# Patient Record
Sex: Female | Born: 1950 | Race: Black or African American | Hispanic: No | Smoking: Former smoker
Health system: Southern US, Community
[De-identification: ages and names within clinical notes are randomized; demographics above are authoritative.]

## PROBLEM LIST (undated history)

## (undated) DIAGNOSIS — I208 Other forms of angina pectoris: Secondary | ICD-10-CM

## (undated) DIAGNOSIS — I10 Essential (primary) hypertension: Secondary | ICD-10-CM

## (undated) DIAGNOSIS — F419 Anxiety disorder, unspecified: Secondary | ICD-10-CM

## (undated) DIAGNOSIS — E119 Type 2 diabetes mellitus without complications: Secondary | ICD-10-CM

## (undated) DIAGNOSIS — M62838 Other muscle spasm: Secondary | ICD-10-CM

## (undated) DIAGNOSIS — F1911 Other psychoactive substance abuse, in remission: Secondary | ICD-10-CM

## (undated) DIAGNOSIS — J189 Pneumonia, unspecified organism: Secondary | ICD-10-CM

## (undated) DIAGNOSIS — G894 Chronic pain syndrome: Secondary | ICD-10-CM

## (undated) DIAGNOSIS — A419 Sepsis, unspecified organism: Secondary | ICD-10-CM

## (undated) DIAGNOSIS — I2510 Atherosclerotic heart disease of native coronary artery without angina pectoris: Secondary | ICD-10-CM

## (undated) DIAGNOSIS — R42 Dizziness and giddiness: Secondary | ICD-10-CM

## (undated) DIAGNOSIS — R531 Weakness: Secondary | ICD-10-CM

## (undated) DIAGNOSIS — K219 Gastro-esophageal reflux disease without esophagitis: Secondary | ICD-10-CM

## (undated) DIAGNOSIS — G459 Transient cerebral ischemic attack, unspecified: Secondary | ICD-10-CM

## (undated) DIAGNOSIS — IMO0001 Reserved for inherently not codable concepts without codable children: Secondary | ICD-10-CM

## (undated) DIAGNOSIS — F329 Major depressive disorder, single episode, unspecified: Secondary | ICD-10-CM

## (undated) DIAGNOSIS — G629 Polyneuropathy, unspecified: Secondary | ICD-10-CM

## (undated) DIAGNOSIS — I499 Cardiac arrhythmia, unspecified: Secondary | ICD-10-CM

## (undated) DIAGNOSIS — E785 Hyperlipidemia, unspecified: Secondary | ICD-10-CM

## (undated) DIAGNOSIS — I6529 Occlusion and stenosis of unspecified carotid artery: Secondary | ICD-10-CM

## (undated) DIAGNOSIS — J449 Chronic obstructive pulmonary disease, unspecified: Secondary | ICD-10-CM

## (undated) DIAGNOSIS — R918 Other nonspecific abnormal finding of lung field: Secondary | ICD-10-CM

## (undated) DIAGNOSIS — I340 Nonrheumatic mitral (valve) insufficiency: Secondary | ICD-10-CM

## (undated) HISTORY — PX: OTHER SURGICAL HISTORY: SHX169

---

## 2006-01-24 ENCOUNTER — Emergency Department: Payer: Self-pay | Admitting: General Practice

## 2006-03-30 ENCOUNTER — Other Ambulatory Visit: Payer: Self-pay

## 2006-03-30 ENCOUNTER — Emergency Department: Payer: Self-pay | Admitting: Emergency Medicine

## 2006-05-20 ENCOUNTER — Emergency Department: Payer: Self-pay | Admitting: Emergency Medicine

## 2006-05-20 ENCOUNTER — Other Ambulatory Visit: Payer: Self-pay

## 2006-07-08 ENCOUNTER — Emergency Department: Payer: Self-pay | Admitting: Emergency Medicine

## 2006-07-08 ENCOUNTER — Other Ambulatory Visit: Payer: Self-pay

## 2006-10-01 ENCOUNTER — Other Ambulatory Visit: Payer: Self-pay

## 2006-10-01 ENCOUNTER — Emergency Department: Payer: Self-pay | Admitting: Emergency Medicine

## 2006-10-05 ENCOUNTER — Ambulatory Visit: Payer: Self-pay | Admitting: Physical Medicine & Rehabilitation

## 2006-10-05 ENCOUNTER — Inpatient Hospital Stay (HOSPITAL_COMMUNITY)
Admission: RE | Admit: 2006-10-05 | Discharge: 2006-10-20 | Payer: Self-pay | Admitting: Physical Medicine & Rehabilitation

## 2006-10-16 ENCOUNTER — Ambulatory Visit: Payer: Self-pay | Admitting: Vascular Surgery

## 2006-10-16 ENCOUNTER — Encounter (INDEPENDENT_AMBULATORY_CARE_PROVIDER_SITE_OTHER): Payer: Self-pay | Admitting: Physical Medicine & Rehabilitation

## 2006-11-23 ENCOUNTER — Encounter: Payer: Self-pay | Admitting: Physical Medicine & Rehabilitation

## 2006-11-28 ENCOUNTER — Encounter
Admission: RE | Admit: 2006-11-28 | Discharge: 2006-11-29 | Payer: Self-pay | Admitting: Physical Medicine & Rehabilitation

## 2006-11-29 ENCOUNTER — Ambulatory Visit: Payer: Self-pay | Admitting: Physical Medicine & Rehabilitation

## 2006-12-02 ENCOUNTER — Encounter: Payer: Self-pay | Admitting: Physical Medicine & Rehabilitation

## 2007-01-01 ENCOUNTER — Encounter: Payer: Self-pay | Admitting: Physical Medicine & Rehabilitation

## 2007-01-26 ENCOUNTER — Emergency Department: Payer: Self-pay | Admitting: Emergency Medicine

## 2007-01-26 ENCOUNTER — Other Ambulatory Visit: Payer: Self-pay

## 2007-02-01 ENCOUNTER — Encounter: Payer: Self-pay | Admitting: Physical Medicine & Rehabilitation

## 2007-02-20 ENCOUNTER — Encounter
Admission: RE | Admit: 2007-02-20 | Discharge: 2007-02-20 | Payer: Self-pay | Admitting: Physical Medicine & Rehabilitation

## 2007-03-04 ENCOUNTER — Encounter: Payer: Self-pay | Admitting: Physical Medicine & Rehabilitation

## 2007-03-23 ENCOUNTER — Ambulatory Visit: Payer: Self-pay | Admitting: Internal Medicine

## 2007-04-01 ENCOUNTER — Encounter: Payer: Self-pay | Admitting: Physical Medicine & Rehabilitation

## 2007-04-15 ENCOUNTER — Emergency Department: Payer: Self-pay | Admitting: Emergency Medicine

## 2007-04-16 ENCOUNTER — Other Ambulatory Visit: Payer: Self-pay

## 2007-05-02 ENCOUNTER — Encounter: Payer: Self-pay | Admitting: Physical Medicine & Rehabilitation

## 2007-05-15 ENCOUNTER — Ambulatory Visit: Payer: Self-pay | Admitting: Internal Medicine

## 2007-06-01 ENCOUNTER — Encounter: Payer: Self-pay | Admitting: Physical Medicine & Rehabilitation

## 2007-07-02 ENCOUNTER — Encounter: Payer: Self-pay | Admitting: Physical Medicine & Rehabilitation

## 2007-11-07 ENCOUNTER — Other Ambulatory Visit: Payer: Self-pay

## 2007-11-07 ENCOUNTER — Emergency Department: Payer: Self-pay | Admitting: Emergency Medicine

## 2007-12-21 ENCOUNTER — Emergency Department: Payer: Self-pay | Admitting: Emergency Medicine

## 2008-02-25 ENCOUNTER — Emergency Department: Payer: Self-pay | Admitting: Emergency Medicine

## 2008-03-05 ENCOUNTER — Emergency Department: Payer: Self-pay | Admitting: Emergency Medicine

## 2008-03-20 ENCOUNTER — Ambulatory Visit: Payer: Self-pay | Admitting: Vascular Surgery

## 2008-05-28 ENCOUNTER — Ambulatory Visit: Payer: Self-pay | Admitting: Gastroenterology

## 2008-06-11 ENCOUNTER — Emergency Department: Payer: Self-pay | Admitting: Emergency Medicine

## 2008-06-13 ENCOUNTER — Inpatient Hospital Stay: Payer: Self-pay | Admitting: Internal Medicine

## 2008-06-21 ENCOUNTER — Encounter: Payer: Self-pay | Admitting: Cardiology

## 2008-06-21 ENCOUNTER — Ambulatory Visit: Payer: Self-pay | Admitting: Family Medicine

## 2008-07-14 ENCOUNTER — Ambulatory Visit: Payer: Self-pay | Admitting: Family Medicine

## 2008-07-25 ENCOUNTER — Emergency Department: Payer: Self-pay | Admitting: Emergency Medicine

## 2008-08-25 ENCOUNTER — Ambulatory Visit: Payer: Self-pay | Admitting: Internal Medicine

## 2008-12-29 ENCOUNTER — Emergency Department: Payer: Self-pay | Admitting: Emergency Medicine

## 2009-01-12 ENCOUNTER — Ambulatory Visit: Payer: Self-pay | Admitting: Vascular Surgery

## 2009-03-05 ENCOUNTER — Ambulatory Visit: Payer: Self-pay | Admitting: Vascular Surgery

## 2009-06-29 ENCOUNTER — Emergency Department: Payer: Self-pay | Admitting: Emergency Medicine

## 2009-07-02 ENCOUNTER — Ambulatory Visit: Payer: Self-pay | Admitting: Vascular Surgery

## 2009-08-11 ENCOUNTER — Ambulatory Visit: Payer: Self-pay | Admitting: Vascular Surgery

## 2009-08-13 ENCOUNTER — Ambulatory Visit: Payer: Self-pay | Admitting: Vascular Surgery

## 2009-08-28 ENCOUNTER — Inpatient Hospital Stay: Payer: Self-pay | Admitting: Vascular Surgery

## 2009-10-02 ENCOUNTER — Ambulatory Visit: Payer: Self-pay | Admitting: Vascular Surgery

## 2010-02-09 ENCOUNTER — Ambulatory Visit: Payer: Self-pay | Admitting: Vascular Surgery

## 2010-06-15 NOTE — Discharge Summary (Signed)
Emily Kidd, Emily Kidd                   ACCOUNT NO.:  [id number]   MEDICAL RECORD NO.:  [id number]          PATIENT TYPE:  IPS   LOCATION:  4038                         FACILITY:  [facility]   PHYSICIAN:  Ellwood Dense, M.D.   DATE OF BIRTH:  12-26-1950   DATE OF ADMISSION:  10/05/2006  DATE OF DISCHARGE:  10/19/2006                               DISCHARGE SUMMARY   DISCHARGE DIAGNOSES:  1. Right frontoparietal cerebrovascular accident.  2. Right internal carotid artery stenosis to be evaluated by Dr. Holley Raring      at [facility].  3. History of cerebrovascular accident in 1996 and April of 2008 with      right-sided weakness.  4. Subcutaneous Lovenox for deep vein thrombosis prophylaxis.  5. Dysphagia.  6. Hyperlipidemia.  7. Hypertension.   This is a 60 year old black female with history of cerebrovascular  accident in 1996 as well as April of 2008 on Aggrenox who was admitted  to [facility] from [facility] on October 01, 2006 with left-sided weakness and slurred speech.  Cranial CT scan  showed a large right frontal to frontoparietal infarction.  Also with  findings of right internal carotid artery stenosis identified on prior  workup for cerebrovascular accident in April of 2008 which was to be  evaluated by Dr. Holley Raring, [phone], with arteriogram in the future.  Placed on subcutaneous Lovenox for deep vein thrombosis prophylaxis.  Remained on Aggrenox for cerebrovascular accident.   PAST MEDICAL HISTORY:  See discharge diagnoses.   HABITS:  Smokes one-half pack per day.  No alcohol.   ALLERGIES:  PENICILLIN.   SOCIAL HISTORY:  [place] parents in [place], [place]  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  One-  level home, five steps to entry.  Noted history of crack cocaine used up  until about 3 years ago.   MEDICATIONS PRIOR TO ADMISSION:  Aggrenox twice daily and Zocor 20 mg   daily.   REHABILITATION HOSPITAL COURSE:  The patient was admitted to inpatient  rehab services with therapies initiated on a 3-hour daily basis  consisting of physical therapy, occupational therapy and speech therapy  with rehabilitation nursing.  The following issues were addressed during  the patient's rehabilitation stay.  Pertaining to Ms. Bastone's right  frontoparietal cerebrovascular accident, she remained on Aggrenox  therapy.  She was supervision set up for bathing, minimal assist upper  body dressing, simple set up with minimal assist lower body dressing,  moderate assist ambulation and minimal assist for basic transfers.  She  did need some encouragement to participate fully with her therapy.  She  was continent of bowel and bladder.  Noted history of cerebrovascular  accident in 1996 as well as April of 2008.  During recent stroke of  April of 2008, noted findings of right internal carotid artery stenosis  with plan for evaluation with arteriogram of the neck for evaluation of  stroke per Dr. Holley Raring to be done [phone]) with this to be arranged  as an outpatient.  She  remained on subcutaneous Lovenox throughout her  rehab course for deep vein thrombosis prophylaxis.  She will continue  her Zocor for hyperlipidemia.  She did have a history of tobacco abuse.  She was weaned from a Nicoderm patch.  It was stressed the need for  cessation of smoking.  It was questionable that she would be compliant  with these requests.  Also, all issues were discussed in regards to no  driving and no illicit drug use.  Her diet had been advanced to  mechanical soft.  She was participating better towards the end of her  therapy program.  Full family teaching was to be completed with her  mother and family.  She was discharged to home.   LABORATORY DATA:  Latest labs showed a hemoglobin 11.8, hematocrit 34.7,  platelets 385,000.  Sodium 138, potassium 3.6, BUN 4, creatinine 0.6   DISCHARGE  MEDICATIONS:  1. Aggrenox 1 capsule twice daily.  2. Zocor 40 mg daily.  3. Baclofen 10 mg every 8 hours.  4. Nicoderm patch 14 mg daily x2 weeks and then 7 mg daily x2 weeks      and then stop.  5. Multivitamin daily.   DIET:  Mechanical soft.   SPECIAL INSTRUCTIONS:  Followup with Dr. Holley Raring, [phone] to schedule  arteriogram of the neck to evaluate cause of stroke.  The patient was to  make this appointment.  She would also see Dr. Thomasena Edis at the outpatient  rehab service office on November 29, 2006.      Emily Kidd, P.A.    ______________________________  Ellwood Dense, M.D.    DA/MEDQ  D:  10/19/2006  T:  10/19/2006  Job:  [id number]   cc:   Ellwood Dense, M.D.  Holley Raring, M.D.  [place], M.D.

## 2010-06-15 NOTE — Assessment & Plan Note (Signed)
The patient returns to the clinic today accompanied by her brother and  her mother.  The patient is a 60 year old African-American female with a  history of prior stroke in 1996 along with a stroke in April of 2008.  She was on Aggrenox for stroke prophylaxis prior to this admission.   The patient was admitted to [facility] from [facility] October 01, 2006, with acute onset of left-sided  weakness and slurred speech.  Cranial CT showed a large right frontal to  frontoparietal infarction.  She also had findings of right internal  carotid artery stenosis identified on prior workup for stroke in April  of 2008.  She was evaluated by Dr. Holley Raring and they planned an arteriogram  in the future.  She was placed on subcu Lovenox for DVT prophylaxis and  remained on Aggrenox therapy.   The patient was moved to the rehabilitation unit at [facility]  on October 05, 2006, and remained there through discharge, October 19, 2006.   Since discharge, the patient has been living at her home with care from  her family, specifically her mother.  She has been attending outpatient  physical and occupational therapy at [facility] two  times per week.  She reports that she is not getting adequate relief  from her Baclofen used 10 mg t.i.d.  She also reports that this makes  her itch.  We have decided to stop that medication and try Zanaflex  instead.   The patient also has complaints about restlessness and problems sleeping  despite taking Benadryl two tablets q.h.s.  We have decided to replace  that Benadryl with Trazodone.  The patient has had an arteriogram last  week at [facility].  She is due for stenting tomorrow and  they are going to contact [facility] to find out exactly where they  need to take her tomorrow.   In terms of bowel and bladder, she is continent and voiding normally.  She does need some help with  bathing and dressing and is unable to  ambulate without assistance at the present time.   MEDICATIONS:  1. Baclofen 10 mg 1/2 tablet t.i.d.  2. Simvastatin 40 mg daily.  3. Plavix 75 mg daily.   REVIEW OF SYSTEMS:  Positive for severe night sweats.   PHYSICAL EXAMINATION:  GENERAL:  Reasonably well-appearing, middle-aged,  adult female seated in a manual wheelchair.  VITAL SIGNS:  Blood pressure 128/66, pulse 88, respiratory rate 18, and  O2 saturation 99% on room air.  EXTREMITIES:  Right upper and right lower extremity strength was 5/5.  Bulk and tone were normal.  Reflexes were 2+ and symmetric.  Left upper  extremity strength showed 3-/5 strength with complaints of pain with  movement of her left shoulder.  Left lower extremity examination showed  3 to 3+/5 strength throughout.   IMPRESSION:  1. Status post right frontoparietal stroke.  2. History of prior stroke in 1996 and April of 2008 with right-sided      weakness.  3. Right internal carotid artery stenosis evaluated with recent      arteriogram and planned stenting tomorrow.  4. Hypertension.  5. Dyslipidemia.   In the office today, we did give the patient a new prescription for  Zanaflex to be used 4 mg 1/2 tablet to 1 tablet b.i.d. in place of  Baclofen.  We also asked her to stop her Benadryl and instead use  Trazodone 50  mg one to two tablets p.o. q.h.s. for insomnia.  We will  plan on seeing the patient in follow-up in approximately three months'  time.  She will remain on her Simvastatin and Plavix at this point.  No refills  were necessary on those two medicines.  They will be contacting [facility] to see when she needs to go in for stenting  tomorrow.           ______________________________  Ellwood Dense, M.D.     DC/MedQ  D:  11/29/2006 09:59:43  T:  11/29/2006 12:51:36  Job #:  [id number]

## 2010-06-15 NOTE — H&P (Signed)
[name], Kidd NO.:  [id number]   MEDICAL RECORD NO.:  [id number]          PATIENT TYPE:  IPS   LOCATION:  4038                         FACILITY:  [facility]   PHYSICIAN:  Ellwood Dense, M.D.   DATE OF BIRTH:  09-Oct-1950   DATE OF ADMISSION:  10/05/2006  DATE OF DISCHARGE:                              HISTORY & PHYSICAL   PRIMARY CARE PHYSICIANS:  Dr. Osker Mason and Dr. Holley Raring, [facility] neurologist.   HISTORY OF PRESENT ILLNESS:  Emily Kidd is a 60 year old African American  female with a history of a stroke in 1996 and then in April of 2008  while on Aggrenox. She reports that she had no residual deficits after  either stroke.   The patient was admitted initially to [facility]  from [facility] September 30, 2006 with left-sided  weakness and slurred speech.  Initial cranial CT showed a large right  frontal to frontoparietal infarct.  She also had findings of a right  internal carotid artery stenosis identified on a prior workup in April  2008.  There was a questionable plan for intervention in the future.  The patient remains on Aggrenox for stroke prophylaxis.  Subcutaneous  Lovenox was added for DVT prophylaxis.  CT angiography was negative.  Follow up cranial CT on October 03, 2006 showed evolution of right  middle cerebral artery infarct with questionable petechial hemorrhage  but no significant bleed.  Dr. Holley Raring had recommended follow up for  potential future studies.  The patient has been seen by speech therapy  and a bedside swallow evaluation showed that she could tolerate thin  liquid diet.  Recommend was to start Altace if blood pressure was  elevated.   The patient was evaluated by the rehabilitation physicians and felt to  be an appropriate candidate for inpatient rehabilitation.   REVIEW OF SYSTEMS:  Positive for numbness and reflux.   PAST MEDICAL HISTORY:  1. History of CVA x2 in  1996 and 2008 with right upper extremity      paresthesias but no significant paresis (the patient had received      TPA after the April 2008 stroke).  2. Dyslipidemia.   FAMILY HISTORY:  Positive for coronary artery disease and stroke.   SOCIAL HISTORY:  The patient lives with her elderly parents in  [place], [place].  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  She has not worked since that time.  She and her parents live in a one-level home with 4-5 steps to enter.  The patient smokes 1/2 pack of cigarettes per day and denies alcohol  usage. She did use crack cocaine until approximately 3 years ago.   FUNCTIONAL HISTORY PRIOR TO ADMISSION:  Independent.   ALLERGIES:  PENICILLIN.   MEDICATIONS PRIOR TO ADMISSION:  1. Aggrenox b.i.d.  2. Zocor 20 mg daily.   LABORATORY DATA:  Recent hemoglobin was 12.5 with a white count of 8.4.  Recent sodium is 140, potassium 3.7, chloride 107, bicarbonate 24,  BUN  16, creatinine 0.8 and glucose of 106.   PHYSICAL EXAMINATION:  GENERAL:  Reasonably well-appearing middle-aged  adult female lying in bed in no acute discomfort.  VITAL SIGNS:  Blood pressure 127/74.  Pulse 73.  Respiratory rate 18 and  oxygen saturation 96% on room air with temperature of 98.0.  HEENT:  Normocephalic, atraumatic.  CARDIOVASCULAR:  Regular rate and rhythm.  S1, S2.  Without murmurs.  ABDOMEN:  Soft, nontender with Positive bowel sounds.  LUNGS:  Clear to auscultation bilaterally.  NEUROLOGIC:  Alert and oriented x3 with occasional cue.  The cranial  nerves II-XII showed slight facial weakness on the left side.  Bilateral  upper extremity exam showed 4-/5 strength throughout.  Bulk and tone  were normal and reflexes were 2+ and symmetrical.  Bilateral lower  extremity exam showed 3+ to 4-/5 strength in hip flexion, knee extension  and ankle dorsiflexion.   IMPRESSION:  1. Status post right frontoparietal infarct  with mild left-sided      weakness.  2. History of prior stroke in 1996 and April 2008 with residual right      upper extremity paresthesias.  3. Presently the patient has deficits in activities of daily living,      transfers, ambulation, swallowing and higher-level cognition      related to the above-noted right frontoparietal stroke.   PLAN:  1. Admit to the rehabilitation unit for daily therapies to include      physical therapy for range of motion, strengthening, bed mobility,      transfers, pre-gait training, gait training and equipment      evaluation.  2. Occupational therapy for range of motion, strengthening, ADLs,      cognitive/perceptual training, splinting and equipment evaluation.  3. Rehabilitation nursing for skin care, wound care and bowel and      bladder training as necessary.  4. Speech therapy for oral motor exercises, vital stimulation and      evaluation of swallow with higher level cognition evaluation as      necessary.  5. Case management to assess home environment, assist with discharge      planning and arrange for appropriate follow-up care.  6. Social worker to assess family and social support, consultation      regarding disability issues and assist in discharge planning.  7. Check admission labs including CBC and comprehensive metabolic      panel in morning October 06, 2006.  8. Continue D3 chopped meats with thin liquid diet.  9. Lovenox 40 mg subcu daily for DVT prophylaxis.  10.Continue Aggrenox 1 capsule p.o. b.i.d.  11.Zocor 40 mg p.o. daily.  12.Tylenol 325 mg 1-2 tablets p.o. q.6 hours p.r.n. for pain.  13.Add Ultram 50 mg 1 tablet t.i.d. p.r.n. for pain relief.   PROGNOSIS:  Fair/good.   ESTIMATED LENGTH OF STAY:  10-15 days.   GOALS:  Modified independence in ADLs, transfers and ambulation.           ______________________________  Ellwood Dense, M.D.     DC/MEDQ  D:  10/06/2006  T:  10/06/2006  Job:  [id number]   cc:   Janine Ores, M.D.  Dr. Osker Mason

## 2010-06-18 ENCOUNTER — Ambulatory Visit: Payer: Self-pay | Admitting: Vascular Surgery

## 2010-08-13 ENCOUNTER — Ambulatory Visit: Payer: Self-pay | Admitting: Vascular Surgery

## 2010-08-20 ENCOUNTER — Inpatient Hospital Stay: Payer: Self-pay | Admitting: Vascular Surgery

## 2010-08-24 LAB — PATHOLOGY REPORT

## 2010-11-12 LAB — DIFFERENTIAL
Basophils Absolute: 0
Basophils Relative: 1
Eosinophils Absolute: 0.2
Eosinophils Relative: 3
Monocytes Absolute: 0.5
Neutro Abs: 3.3

## 2010-11-12 LAB — URINE MICROSCOPIC-ADD ON

## 2010-11-12 LAB — CBC
HCT: 34.7 — ABNORMAL LOW
Hemoglobin: 11.8 — ABNORMAL LOW
MCHC: 34
MCV: 95.3
Platelets: 385
RBC: 3.64 — ABNORMAL LOW
RDW: 13.1
WBC: 5.5

## 2010-11-12 LAB — URINALYSIS, ROUTINE W REFLEX MICROSCOPIC
Bilirubin Urine: NEGATIVE
Bilirubin Urine: NEGATIVE
Glucose, UA: NEGATIVE
Hgb urine dipstick: NEGATIVE
Ketones, ur: NEGATIVE
Ketones, ur: NEGATIVE
Ketones, ur: NEGATIVE
Nitrite: NEGATIVE
Nitrite: NEGATIVE
Nitrite: NEGATIVE
Protein, ur: NEGATIVE
Protein, ur: NEGATIVE
Specific Gravity, Urine: 1.022
Urobilinogen, UA: 1
Urobilinogen, UA: 1
pH: 5.5

## 2010-11-12 LAB — URINE CULTURE
Colony Count: >=100000
Culture: NO GROWTH

## 2010-11-12 LAB — COMPREHENSIVE METABOLIC PANEL
ALT: 25
Albumin: 3.5
Alkaline Phosphatase: 58
BUN: 4 — ABNORMAL LOW
Chloride: 110
Glucose, Bld: 97
Potassium: 3.6
Sodium: 138
Total Bilirubin: 0.5

## 2010-11-19 ENCOUNTER — Ambulatory Visit: Payer: Self-pay

## 2011-05-02 ENCOUNTER — Emergency Department: Payer: Self-pay | Admitting: Emergency Medicine

## 2011-05-02 LAB — COMPREHENSIVE METABOLIC PANEL
Alkaline Phosphatase: 103 U/L (ref 50–136)
Anion Gap: 8 (ref 7–16)
BUN: 10 mg/dL (ref 7–18)
Bilirubin,Total: 0.3 mg/dL (ref 0.2–1.0)
Chloride: 107 mmol/L (ref 98–107)
Co2: 25 mmol/L (ref 21–32)
Creatinine: 0.7 mg/dL (ref 0.60–1.30)
Glucose: 97 mg/dL (ref 65–99)
Potassium: 3.2 mmol/L — ABNORMAL LOW (ref 3.5–5.1)
SGOT(AST): 20 U/L (ref 15–37)
Sodium: 140 mmol/L (ref 136–145)

## 2011-05-02 LAB — TSH: Thyroid Stimulating Horm: 1.23 u[IU]/mL

## 2011-05-02 LAB — CBC
HCT: 36.9 % (ref 35.0–47.0)
MCV: 90 fL (ref 80–100)
RBC: 4.1 10*6/uL (ref 3.80–5.20)
RDW: 14.8 % — ABNORMAL HIGH (ref 11.5–14.5)

## 2011-05-02 LAB — DRUG SCREEN, URINE
Amphetamines, Ur Screen: NEGATIVE (ref ?–1000)
Barbiturates, Ur Screen: NEGATIVE (ref ?–200)
MDMA (Ecstasy)Ur Screen: POSITIVE (ref ?–500)
Phencyclidine (PCP) Ur S: NEGATIVE (ref ?–25)
Tricyclic, Ur Screen: NEGATIVE (ref ?–1000)

## 2011-05-02 LAB — ETHANOL: Ethanol: <3 mg/dL

## 2011-05-02 LAB — ACETAMINOPHEN LEVEL: Acetaminophen: 6 ug/mL — ABNORMAL LOW

## 2011-07-02 LAB — COMPREHENSIVE METABOLIC PANEL
Albumin: 3.4 g/dL (ref 3.4–5.0)
Alkaline Phosphatase: 103 U/L (ref 50–136)
Anion Gap: 9 (ref 7–16)
BUN: 10 mg/dL (ref 7–18)
Calcium, Total: 8.6 mg/dL (ref 8.5–10.1)
Co2: 25 mmol/L (ref 21–32)
EGFR (African American): >60
EGFR (Non-African Amer.): >60
Glucose: 98 mg/dL (ref 65–99)
SGOT(AST): 16 U/L (ref 15–37)
Sodium: 143 mmol/L (ref 136–145)

## 2011-07-02 LAB — CBC WITH DIFFERENTIAL/PLATELET
HGB: 11.5 g/dL — ABNORMAL LOW (ref 12.0–16.0)
MCHC: 32.2 g/dL (ref 32.0–36.0)
Monocyte %: 12.7 %
RDW: 15.7 % — ABNORMAL HIGH (ref 11.5–14.5)

## 2011-07-02 LAB — APTT: Activated PTT: 39.2 secs — ABNORMAL HIGH (ref 23.6–35.9)

## 2011-07-02 LAB — CK-MB: CK-MB: <0.5 ng/mL — ABNORMAL LOW (ref 0.5–3.6)

## 2011-07-03 LAB — COMPREHENSIVE METABOLIC PANEL
Anion Gap: 10 (ref 7–16)
BUN: 8 mg/dL (ref 7–18)
Bilirubin,Total: 0.4 mg/dL (ref 0.2–1.0)
Calcium, Total: 9 mg/dL (ref 8.5–10.1)
Co2: 24 mmol/L (ref 21–32)
EGFR (African American): >60
EGFR (Non-African Amer.): >60
Glucose: 97 mg/dL (ref 65–99)
Osmolality: 281 (ref 275–301)
Potassium: 3.5 mmol/L (ref 3.5–5.1)
SGPT (ALT): 20 U/L
Sodium: 142 mmol/L (ref 136–145)
Total Protein: 7.5 g/dL (ref 6.4–8.2)

## 2011-07-03 LAB — CBC WITH DIFFERENTIAL/PLATELET
Basophil #: 0 10*3/uL (ref 0.0–0.1)
Basophil %: 0.3 %
Eosinophil %: 2.6 %
HCT: 37.2 % (ref 35.0–47.0)
HGB: 12.3 g/dL (ref 12.0–16.0)
Lymphocyte #: 1 10*3/uL (ref 1.0–3.6)
MCH: 29.3 pg (ref 26.0–34.0)
Monocyte %: 9.3 %
Neutrophil #: 3.5 10*3/uL (ref 1.4–6.5)
Platelet: 291 10*3/uL (ref 150–440)
RBC: 4.21 10*6/uL (ref 3.80–5.20)

## 2011-07-03 LAB — LIPID PANEL
Cholesterol: 135 mg/dL (ref 0–200)
HDL Cholesterol: 53 mg/dL (ref 40–60)
Triglycerides: 140 mg/dL (ref 0–200)

## 2011-07-03 LAB — TROPONIN I: Troponin-I: <0.02 ng/mL

## 2011-07-04 ENCOUNTER — Inpatient Hospital Stay: Payer: Self-pay | Admitting: Internal Medicine

## 2011-11-28 ENCOUNTER — Emergency Department: Payer: Self-pay | Admitting: Emergency Medicine

## 2011-11-28 LAB — TROPONIN I
Troponin-I: <0.02 ng/mL
Troponin-I: <0.02 ng/mL

## 2011-11-28 LAB — CK TOTAL AND CKMB (NOT AT ARMC): CK-MB: <0.5 ng/mL — ABNORMAL LOW (ref 0.5–3.6)

## 2011-11-28 LAB — CBC
MCV: 89 fL (ref 80–100)
Platelet: 337 10*3/uL (ref 150–440)
RBC: 4.43 10*6/uL (ref 3.80–5.20)

## 2011-11-28 LAB — BASIC METABOLIC PANEL
Anion Gap: 10 (ref 7–16)
BUN: 11 mg/dL (ref 7–18)
Creatinine: 0.65 mg/dL (ref 0.60–1.30)
EGFR (African American): >60
EGFR (Non-African Amer.): >60
Glucose: 86 mg/dL (ref 65–99)
Sodium: 145 mmol/L (ref 136–145)

## 2012-03-22 ENCOUNTER — Ambulatory Visit: Payer: Self-pay | Admitting: Specialist

## 2012-07-24 ENCOUNTER — Emergency Department: Payer: Self-pay | Admitting: Emergency Medicine

## 2012-07-24 LAB — COMPREHENSIVE METABOLIC PANEL
Alkaline Phosphatase: 125 U/L (ref 50–136)
Anion Gap: 7 (ref 7–16)
BUN: 15 mg/dL (ref 7–18)
Bilirubin,Total: 0.3 mg/dL (ref 0.2–1.0)
Calcium, Total: 8.6 mg/dL (ref 8.5–10.1)
Chloride: 111 mmol/L — ABNORMAL HIGH (ref 98–107)
Creatinine: 0.87 mg/dL (ref 0.60–1.30)
EGFR (African American): >60
EGFR (Non-African Amer.): >60
Glucose: 103 mg/dL — ABNORMAL HIGH (ref 65–99)
Osmolality: 286 (ref 275–301)
Potassium: 3.5 mmol/L (ref 3.5–5.1)
SGOT(AST): 23 U/L (ref 15–37)
Sodium: 143 mmol/L (ref 136–145)

## 2012-07-24 LAB — CBC WITH DIFFERENTIAL/PLATELET
Basophil #: 0 10*3/uL (ref 0.0–0.1)
Eosinophil #: 0.1 10*3/uL (ref 0.0–0.7)
Eosinophil %: 3.4 %
HCT: 35.1 % (ref 35.0–47.0)
HGB: 11.8 g/dL — ABNORMAL LOW (ref 12.0–16.0)
MCH: 29.8 pg (ref 26.0–34.0)
MCV: 88 fL (ref 80–100)
Monocyte #: 0.5 x10 3/mm (ref 0.2–0.9)
Neutrophil #: 1.8 10*3/uL (ref 1.4–6.5)
Neutrophil %: 44.7 %
Platelet: 229 10*3/uL (ref 150–440)
RDW: 14.5 % (ref 11.5–14.5)
WBC: 4 10*3/uL (ref 3.6–11.0)

## 2012-09-04 ENCOUNTER — Ambulatory Visit: Payer: Self-pay | Admitting: Specialist

## 2013-03-11 ENCOUNTER — Ambulatory Visit: Payer: Self-pay | Admitting: Specialist

## 2013-04-04 ENCOUNTER — Ambulatory Visit: Payer: Self-pay | Admitting: Family Medicine

## 2013-04-11 ENCOUNTER — Ambulatory Visit: Payer: Self-pay | Admitting: Cardiothoracic Surgery

## 2013-05-01 ENCOUNTER — Ambulatory Visit: Payer: Self-pay | Admitting: Cardiothoracic Surgery

## 2013-06-11 ENCOUNTER — Observation Stay: Payer: Self-pay | Admitting: Family Medicine

## 2013-06-11 LAB — CBC
HCT: 39.3 % (ref 35.0–47.0)
HGB: 12.9 g/dL (ref 12.0–16.0)
MCH: 29.4 pg (ref 26.0–34.0)
MCHC: 32.9 g/dL (ref 32.0–36.0)
MCV: 89 fL (ref 80–100)
PLATELETS: 282 10*3/uL (ref 150–440)
RBC: 4.4 10*6/uL (ref 3.80–5.20)
RDW: 14.8 % — ABNORMAL HIGH (ref 11.5–14.5)
WBC: 5.2 10*3/uL (ref 3.6–11.0)

## 2013-06-11 LAB — BASIC METABOLIC PANEL
ANION GAP: 5 — AB (ref 7–16)
BUN: 9 mg/dL (ref 7–18)
CO2: 25 mmol/L (ref 21–32)
CREATININE: 0.72 mg/dL (ref 0.60–1.30)
Calcium, Total: 8.6 mg/dL (ref 8.5–10.1)
Chloride: 110 mmol/L — ABNORMAL HIGH (ref 98–107)
Glucose: 93 mg/dL (ref 65–99)
OSMOLALITY: 278 (ref 275–301)
Potassium: 3.9 mmol/L (ref 3.5–5.1)
Sodium: 140 mmol/L (ref 136–145)

## 2013-06-11 LAB — TROPONIN I
Troponin-I: <0.02 ng/mL
Troponin-I: <0.02 ng/mL
Troponin-I: <0.02 ng/mL

## 2013-06-12 LAB — CBC WITH DIFFERENTIAL/PLATELET
BASOS ABS: 0 10*3/uL (ref 0.0–0.1)
BASOS PCT: 0.3 %
EOS PCT: 2.2 %
Eosinophil #: 0.2 10*3/uL (ref 0.0–0.7)
HCT: 39.6 % (ref 35.0–47.0)
HGB: 12.8 g/dL (ref 12.0–16.0)
Lymphocyte #: 1.4 10*3/uL (ref 1.0–3.6)
Lymphocyte %: 20.9 %
MCH: 29.3 pg (ref 26.0–34.0)
MCHC: 32.4 g/dL (ref 32.0–36.0)
MCV: 90 fL (ref 80–100)
MONO ABS: 0.7 x10 3/mm (ref 0.2–0.9)
MONOS PCT: 11 %
Neutrophil #: 4.4 10*3/uL (ref 1.4–6.5)
Neutrophil %: 65.6 %
Platelet: 272 10*3/uL (ref 150–440)
RBC: 4.38 10*6/uL (ref 3.80–5.20)
RDW: 14.7 % — ABNORMAL HIGH (ref 11.5–14.5)
WBC: 6.8 10*3/uL (ref 3.6–11.0)

## 2013-06-12 LAB — BASIC METABOLIC PANEL
ANION GAP: 8 (ref 7–16)
BUN: 11 mg/dL (ref 7–18)
Calcium, Total: 8.9 mg/dL (ref 8.5–10.1)
Chloride: 112 mmol/L — ABNORMAL HIGH (ref 98–107)
Co2: 24 mmol/L (ref 21–32)
Creatinine: 0.63 mg/dL (ref 0.60–1.30)
EGFR (African American): >60
GLUCOSE: 92 mg/dL (ref 65–99)
OSMOLALITY: 286 (ref 275–301)
Potassium: 3.7 mmol/L (ref 3.5–5.1)
Sodium: 144 mmol/L (ref 136–145)

## 2013-12-16 ENCOUNTER — Ambulatory Visit: Payer: Self-pay | Admitting: Family Medicine

## 2013-12-23 ENCOUNTER — Ambulatory Visit: Payer: Self-pay | Admitting: Family Medicine

## 2014-01-26 ENCOUNTER — Emergency Department: Payer: Self-pay | Admitting: Emergency Medicine

## 2014-01-26 LAB — CBC
HCT: 38.4 % (ref 35.0–47.0)
HGB: 12.4 g/dL (ref 12.0–16.0)
MCH: 28.5 pg (ref 26.0–34.0)
MCHC: 32.2 g/dL (ref 32.0–36.0)
MCV: 88 fL (ref 80–100)
PLATELETS: 266 10*3/uL (ref 150–440)
RBC: 4.34 10*6/uL (ref 3.80–5.20)
RDW: 14.9 % — ABNORMAL HIGH (ref 11.5–14.5)
WBC: 6.6 10*3/uL (ref 3.6–11.0)

## 2014-01-26 LAB — COMPREHENSIVE METABOLIC PANEL
ALBUMIN: 3.3 g/dL — AB (ref 3.4–5.0)
ANION GAP: 10 (ref 7–16)
Alkaline Phosphatase: 118 U/L — ABNORMAL HIGH
BILIRUBIN TOTAL: 0.3 mg/dL (ref 0.2–1.0)
BUN: 7 mg/dL (ref 7–18)
CHLORIDE: 110 mmol/L — AB (ref 98–107)
Calcium, Total: 8.7 mg/dL (ref 8.5–10.1)
Co2: 24 mmol/L (ref 21–32)
Creatinine: 0.8 mg/dL (ref 0.60–1.30)
EGFR (Non-African Amer.): >60
GLUCOSE: 117 mg/dL — AB (ref 65–99)
Osmolality: 286 (ref 275–301)
POTASSIUM: 3.1 mmol/L — AB (ref 3.5–5.1)
SGOT(AST): 29 U/L (ref 15–37)
SGPT (ALT): 28 U/L
Sodium: 144 mmol/L (ref 136–145)
Total Protein: 6.7 g/dL (ref 6.4–8.2)

## 2014-01-26 LAB — CK TOTAL AND CKMB (NOT AT ARMC): CK, Total: 70 U/L (ref 26–192)

## 2014-01-26 LAB — TROPONIN I: Troponin-I: <0.02 ng/mL

## 2014-03-02 ENCOUNTER — Observation Stay: Payer: Self-pay | Admitting: Internal Medicine

## 2014-03-02 LAB — HEPATIC FUNCTION PANEL A (ARMC)
ALBUMIN: 3.6 g/dL (ref 3.4–5.0)
ALK PHOS: 100 U/L (ref 46–116)
Bilirubin,Total: 0.4 mg/dL (ref 0.2–1.0)
SGOT(AST): 34 U/L (ref 15–37)
SGPT (ALT): 33 U/L (ref 14–63)
TOTAL PROTEIN: 7.3 g/dL (ref 6.4–8.2)

## 2014-03-02 LAB — CBC
HCT: 40.1 % (ref 35.0–47.0)
HGB: 12.8 g/dL (ref 12.0–16.0)
MCH: 27.7 pg (ref 26.0–34.0)
MCHC: 31.9 g/dL — ABNORMAL LOW (ref 32.0–36.0)
MCV: 87 fL (ref 80–100)
Platelet: 307 10*3/uL (ref 150–440)
RBC: 4.62 10*6/uL (ref 3.80–5.20)
RDW: 15.6 % — ABNORMAL HIGH (ref 11.5–14.5)
WBC: 6.7 10*3/uL (ref 3.6–11.0)

## 2014-03-02 LAB — TROPONIN I
Troponin-I: <0.02 ng/mL
Troponin-I: <0.02 ng/mL

## 2014-03-02 LAB — BASIC METABOLIC PANEL
Anion Gap: 5 — ABNORMAL LOW (ref 7–16)
BUN: 8 mg/dL (ref 7–18)
CALCIUM: 9.2 mg/dL (ref 8.5–10.1)
CREATININE: 0.74 mg/dL (ref 0.60–1.30)
Chloride: 110 mmol/L — ABNORMAL HIGH (ref 98–107)
Co2: 27 mmol/L (ref 21–32)
EGFR (African American): >60
GLUCOSE: 94 mg/dL (ref 65–99)
Osmolality: 281 (ref 275–301)
Potassium: 4.1 mmol/L (ref 3.5–5.1)
SODIUM: 142 mmol/L (ref 136–145)

## 2014-03-02 LAB — D-DIMER(ARMC): D-Dimer: 1163 ng/ml

## 2014-03-02 LAB — PROTIME-INR
INR: 1
PROTHROMBIN TIME: 13 s

## 2014-03-02 LAB — PRO B NATRIURETIC PEPTIDE: B-TYPE NATIURETIC PEPTID: 14 pg/mL (ref 0–125)

## 2014-03-03 LAB — TROPONIN I: Troponin-I: <0.02 ng/mL

## 2014-05-24 NOTE — Consult Note (Signed)
PATIENT NAME:  Emily Kidd, Emily Kidd MR#:  [id number] DATE OF BIRTH:  [birth year]  DATE OF CONSULTATION:  06/11/2013  REFERRING PHYSICIAN:   CONSULTING PHYSICIAN:  Dionisio David, MD  INDICATION FOR CONSULTATION: Chest pain.   HISTORY OF PRESENT ILLNESS: This is a 64 year old African American female with history of COPD who presented to the Emergency Room with chest pain described as pressure-type associated with shortness of breath and left arm numbness. The chest pain is still happening intermittently. Her first set of troponin was negative. I was asked to evaluate the patient. The patient has prior history of having just COPD. No history of hypertension, diabetes, or hyperlipidemia.   SOCIAL HISTORY: She denies EtOH abuse or smoking.   FAMILY HISTORY: Unremarkable also.   PHYSICAL EXAMINATION: GENERAL: She is alert and oriented x3, in no acute distress.  VITALS: Blood pressure 160/90, pulse 75, and respirations 18. She is afebrile.  NECK: No JVD.  LUNGS: Clear.  HEART: Regular rate and rhythm. Normal S1 and S2. No audible murmur.  ABDOMEN: Soft, nontender. Positive bowel sounds.  EXTREMITIES: No pedal edema.  NEUROLOGIC: She appears to be intact.   DIAGNOSTIC DATA: EKG shows normal sinus rhythm, 77 beats per minute, mild LVH, nonspecific ST-T changes.   First set of troponin is negative. Electrolytes are normal.   ASSESSMENT AND PLAN: Atypical chest pain, most likely musculoskeletal. We will rule out myocardial infarction and we will do outpatient stress Myoview on Thursday at 8:15 in the office at [facility]. Phone number there is [phone]. Directions are given to the patient. She has been already over there. Thank you very much for the referral.   ____________________________ Dionisio David, MD sak:sb D: 06/11/2013 14:06:24 ET T: 06/11/2013 14:30:02 ET JOB#: [id number]  cc: Dionisio David, MD, <Dictator> Dionisio David MD ELECTRONICALLY SIGNED 06/26/2013 13:58

## 2014-05-24 NOTE — H&P (Signed)
PATIENT NAME:  Emily Kidd, Emily Kidd MR#:  [id number] DATE OF BIRTH:  04/10/1950  DATE OF ADMISSION:  06/11/2013  PRIMARY CARE PHYSICIAN:  [place].   REQUESTING PHYSICIAN: Briant Sites. Joni Fears, MD   CHIEF COMPLAINT: Chest pain.   HISTORY OF PRESENT ILLNESS: The patient is a 64 year old female with a known history of peripheral vascular disease, hyperlipidemia, neuropathy and CVA who is being admitted for chest pain. The patient was watching TV this morning around 9:00 when she started with sudden onset substernal chest pain, more a pressure in nature, 10 out of 10, not associated with any shortness of breath. It was radiating to her right jaw and some tingling in the right arm which normally she always has it due to her carpal tunnel syndrome but this was worse. EMS was called. She was given full-dose aspirin along with nitroglycerin and she became chest pain-free, but she has continued to have tingling and pain in her right arm. She is being admitted for further evaluation and management.   PAST MEDICAL HISTORY: 1.  Peripheral vascular disease.  2.  Left above-knee amputation.  3.  History of hyperlipidemia.  4.  Neuropathy.  5.  History of cerebrovascular accident.  6.  Hypertension.  7.  Chronic pain syndrome.  8.  Depression.   ALLERGIES: PENICILLIN.   SOCIAL HISTORY: She is a former smoker. She is widowed for 2 years, lives with her mother. No kids, no alcohol. She used to work as a [employer] manager.   MEDICATIONS AT HOME: 1.  Aspirin 81 mg p.o. daily.  2.  Caduet 5/20 mg 1 tablet p.o. daily.  3.  Celebrex 200 mg p.o. b.i.d.  4.  Mucinex 600 mg p.o. b.i.d. as needed.   5.  Lorazepam 1 mg p.o. b.i.d.  6.  Lyrica 100 mg p.o. b.i.d.  7.  Melatonin 3 mg p.o. at bedtime.  8. Ultram 100 mg p.o. b.i.d.  9.  Oxybutynin 5 mg p.o. daily.  10.  Percocet 5/325 mg 1 tablet p.o. b.i.d. as needed.  11.  Plavix 75 mg p.o. daily.  12.  Protonix 40 mg p.o. daily.  13.  Trazodone 150 mg p.o. at bedtime.    Please note, her medication reconciliation is being performed at this time. This, to my knowledge, is in the computer.   FAMILY HISTORY: No stroke or hypertension or coronary artery disease.   REVIEW OF SYSTEMS:    CONSTITUTIONAL: No fever, fatigue, weakness.  EYES: No blurry or double vision.  ENT: No tinnitus or ear pain.  RESPIRATORY: No cough or hemoptysis.  CARDIOVASCULAR: Positive for chest pain. No orthopnea or edema.  GASTROINTESTINAL: No nausea, vomiting, diarrhea.  GENITOURINARY: No dysuria or hematuria.  ENDOCRINE: No polyuria or nocturia.  HEMATOLOGY: No anemia or easy bruising. SKIN: No rash or lesion.  MUSCULOSKELETAL: Carpal tunnel syndrome in her right arm for which she is wearing splint.  NEUROLOGIC: No weakness. She does have a history of CVA and has carpal tunnel syndrome in her right causing her to have some tingling and numbness in her right arm, has been wearing splint.  PSYCHIATRY: No history of anxiety or depression.   PHYSICAL EXAMINATION: VITAL SIGNS: Temperature 98.2, heart rate 77 per minute, respirations 18 per minute, blood pressure 125/61 mmHg.  She is saturating 93% on room air.  GENERAL: The patient is a 64 year old female lying in the bed comfortably without any acute distress.  EYES: Pupils equal, round, reactive to light and accommodation. No scleral icterus,  extraocular muscles  intact.  HEENT: Head atraumatic, normocephalic. Oropharynx and nasopharynx clear. NECK:  Supple, no jugular venous distention. No thyroid enlargement or tenderness.  LUNGS: Clear to auscultation bilaterally. No wheezing, rales, rhonchi or crepitation.  CARDIOVASCULAR: S1, S2 normal. No murmurs, rubs or gallop.  ABDOMEN: Soft, nontender, nondistended. Bowel sounds present. No organomegaly or masses.  EXTREMITIES: She has a left above-knee amputation, poor circulation in her right lower extremity. No cyanosis or clubbing.  MUSCULOSKELETAL: No joint effusion or tenderness.  Gait not checked.  SKIN:  No rash, lesion or ulcer; warm and dry.  NEUROLOGICALLY:  Cranial nerves II through XII intact. She does have some left upper extremity weakness as well as contracture from previous stroke; some dysarthria which seems baseline although she claims it has gotten worse.  PSYCHIATRY: The patient is alert and oriented x 3.   LABORATORY, DIAGNOSTIC AND RADIOLOGICAL DATA:   1.  Normal BMP. Normal CBC. Normal first set of troponin.  2.  EKG shows normal sinus rhythm, no major ST-T changes.  3.  Chest x-ray in the ED shows no acute cardiopulmonary disease.    IMPRESSION AND PLAN: 1.  Chest pain. We will rule out with serial troponins and myoview in the morning. Consult cardiology. Will monitor on telemetry. Will start her on aspirin and nitroglycerin along with beta blockers if she is not on it.  2.  Neuropathy. We will continue her Lyrica.  3.  Gastroesophageal reflux disease. We will continue her Protonix.  4.  Hypertension. Continue her home medication. Her blood pressure seems fairly well controlled.   TOTAL TIME TAKING CARE OF THIS PATIENT: 35 minutes.   ____________________________ Lucina Mellow. Manuella Ghazi, MD vss:cs D: 06/11/2013 13:52:59 ET T: 06/11/2013 14:06:45 ET JOB#: [id number]  cc: Rindy Kollman S. Manuella Ghazi, MD, <Dictator> [facility] MD ELECTRONICALLY SIGNED 06/25/2013 20:05

## 2014-05-24 NOTE — Discharge Summary (Signed)
PATIENT NAME:  Emily Kidd, Emily Kidd MR#:  [id number] DATE OF BIRTH:  September 18, 1950  DATE OF ADMISSION:  06/11/2013 DATE OF DISCHARGE:  06/12/2013  REASON FOR ADMISSION:  Chest pain.   DISPOSITION:  Home with followup with Dr. Devona Konig on the 14th at 8:15 for outpatient stress test and possible CT of the coronary arteries.   MEDICATIONS AT DISCHARGE:  Melatonin 3 mg once a day, Mucinex 600 mg 3-5 hours, Plavix 75 mg daily, amlodipine with atorvastatin 5/20 once a day, Lyrica 150 mg twice daily, tramadol 50 mg twice daily, trazodone 150 mg once a day, citalopram 20 mg once a day, oxybutynin 5 mg once a day, ranitidine 150 mg once a day, senna 25 mg once a day.   Follow up with Devona Konig on Thursday at 8:15 for Myoview. Follow up with Alyse Low at [facility] in 1 to 2 weeks.   DIET:  Low cholesterol, low fat.   HOSPITAL COURSE:  A 64 year old female with history of peripheral vascular disease, left above-the-knee amputation, hyperlipidemia, neuropathy, history of previous CVA, hypertension, chronic pain syndrome and depression, comes to the Emergency Department on 06/11/2013, with a chief complaint of chest pain.   The patient had chest pain while watching TV around 9:00 a.m. By 10:00, she had the pain being 10 out of 10. Shortness of breath was associated and the pain was radiating to the right jaw, tingling on the right arm. Received nitroglycerin and aspirin, and the pain started to subside. The patient was admitted for chest pain evaluation with troponins that were negative x 3. All her labs were overall normal with a white count of 5.2, 6.8 at discharge; hemoglobin of 12 and normal creatinine of 0.63, normal potassium, normal sodium. Her EKG showed normal sinus rhythm. There was minimal voltage criteria for LVH and there were doubts about anterior MI, but no significant ST depression or elevation.   The patient was evaluated by Dr. Humphrey Rolls, who wanted to discharge her on the 12th and follow up in  the clinic for outpatient stress test. The patient started having chest pain, did not want to leave. We decided to do a cardiac catheterization the next morning by Dr. Devona Konig assessment. The  patient could not tolerate the procedure because of pain during the time the veins were trying to be accessed.   The patient was asked to follow up outpatient for the stress test and a possible calcium score on CT.   The patient did well and was pain-free, for which she was discharged.   The patient did not have any significant complications during this hospitalization and was free of chest pain whenever she left.   TIME SPENT:  I spent about 40 minutes discharging this patient on day of discharge.   ____________________________ Bostonia Sink, MD rsg:dmm D: 06/14/2013 13:50:46 ET T: 06/14/2013 21:38:32 ET JOB#: [id number]  cc:  Sink, MD, <Dictator> Marsh Heckler America Brown MD ELECTRONICALLY SIGNED 06/21/2013 22:25

## 2014-05-25 NOTE — Consult Note (Signed)
         Electronic Signatures: Stephens November H (NP)  (Signed 06-Jun-13 11:27)  Authored: Brief Consult Note   Last Updated: 06-Jun-13 11:27 by Theodore Demark (NP)

## 2014-05-25 NOTE — Consult Note (Signed)
PATIENT NAME:  Emily Kidd, Emily Kidd MR#:  [id number] DATE OF BIRTH:  [birth year]  DATE OF CONSULTATION:  07/04/2011  REFERRING PHYSICIAN:   CONSULTING PHYSICIAN:  Algernon Huxley, MD  REASON FOR CONSULTATION: Carotid artery stenosis.   HISTORY OF PRESENT ILLNESS: This is a 64 year old African American female with an extensive vascular history who is status post multiple revascularization attempts by Dr. Hortencia Pilar and subsequent left above-knee amputation once her disease became non-unreconstructable. She is admitted with mental status changes and with sounds like some aphagia and right hand numbness. She has a history of stroke with chronic slurring of speech and more issues of her left side than her right, but clouding the water it is not entirely clear what is her baseline and what are new symptoms. Apparently her right hand numbness is better, her speech is somewhat difficult to discern, although she does answer most questions appropriately, and she has some weakness in both upper extremities which is mild.  PAST MEDICAL HISTORY:  1. Extensive peripheral vascular disease.  2. Left lower extremity ischemia with multiple revascularizations and eventual amputation.  3. Hyperlipidemia.  4. Long-time tobacco user, quit three years ago.  5. Neuropathy.  6. Stroke.  7. Hypertension.  8. Chronic pain syndrome.  9. Depression.   PAST SURGICAL HISTORY:  1. Multiple previous revascularizations to left lower extremity.  2. Left above-knee amputation.   HOME MEDICATIONS:  1. Ranitidine 75 mg at bedtime.  2. Tylenol 1 gram three times daily. 3. Lyrica 150 mg twice a day. 4. Tramadol 50 mg daily.  5. Protonix 40 mg daily.  6. Caduet 5/20 mg daily.  7. Oxybutynin 5 mg daily.  8. Vitamin D monthly.  9. Plavix 75 mg daily.  10. Citalopram 20 mg daily.  11. Trazodone 150 mg daily.   ALLERGIES: Penicillin.   SOCIAL HISTORY: She quit smoking a couple of years ago. She is widowed. 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   FAMILY HISTORY: Noncontributory to present illness.   REVIEW OF SYSTEMS: GENERAL: No fevers or chills. No unintentional weight loss recently. EYES: No blurred or double vision. No tinnitus or ear pain. CARDIOVASCULAR: No palpitations or chest pain. RESPIRATORY: No shortness breath or cough. GASTROINTESTINAL: No nausea, vomiting, or diarrhea. GU: No dysuria or hematuria. ENDOCRINE: No heat or cold intolerance. PSYCH: No worsening anxiety or depression. NEUROLOGIC: As per history of present illness. MUSCULOSKELETAL: Left above-knee amputation. SKIN: No new rash or ulcers.   PHYSICAL EXAMINATION:   GENERAL: This is a well nourished [place] American female who looks older than her stated age.   VITAL SIGNS: Temperature 98.3, pulse 81, blood pressure 114/73, and saturations 96% on room air.   HEAD/FACE:  Some left-sided facial droop is present, unclear if this is chronic or new.   EYES: Sclerae anicteric. Conjunctivae are clear.   EARS: Normal external appearance. Hearing appears intact.   NECK: Supple without adenopathy or jugular venous distention. Carotids have good upstroke. Soft bruits bilaterally.   HEART: Regular rate and rhythm without murmurs.   LUNGS: Expiratory wheezes bilaterally.   ABDOMEN: Soft, nondistended, and nontender.   EXTREMITIES: left above-knee amputation is well healed. Right foot is warm. Pedal pulses are not easily palpable.   NEURO: Grip strength is 4/5 in both upper extremities. Sensation appears intact in all remaining extremities. Answers most questions appropriately.   SKIN: Warm and dry.   LABS/STUDIES: Sodium 142, potassium 3.5, chloride 108, CO2 24, BUN 8, creatinine 0.59,  and glucose 97. White blood cell count 5.2, hemoglobin 12.3, and platelet count 291,000.   She had a carotid duplex that was very confusing in its wording within the body of the description. There were areas in both the left and right carotid systems that  were described as demonstrating less than 50% stenosis with borderline ICA/CCA ratios of 2.9, but in the conclusion of the report there were findings concerning for hemodynamically significant stenosis in the left carotid system as well as in the right. I am not sure of what to make of the wording and nomenclature and the peak systolic and end-diastolic velocities are not given for our evaluation.  MRI was performed that showed chronic changes without evidence of an acute abnormality.   ASSESSMENT AND PLAN: This is a 64 year old African American female with extensive peripheral vascular history who was admitted with recurrent stroke. I am not really sure what to make of her carotid ultrasound. She has normal renal function so I would recommend getting a CT angiogram for further evaluation of her carotid disease go help determine whether or not          carotid intervention is of benefit for stroke risk reduction. We will follow up after the results of the CT angiogram are available to [place].   This is a level-4 consultation.  ____________________________ Algernon Huxley, MD jsd:slb D: 07/13/2011 13:29:09 ET T: 07/13/2011 15:32:31 ET JOB#: [id number]  cc: Algernon Huxley, MD, <Dictator> Algernon Huxley MD ELECTRONICALLY SIGNED 07/21/2011 9:05

## 2014-05-25 NOTE — Discharge Summary (Signed)
PATIENT NAME:  Emily, SHAFER MR#:  [id number] DATE OF BIRTH:  1951-01-13  DATE OF ADMISSION:  07/04/2011 DATE OF DISCHARGE:  07/07/2011  ADMITTING DIAGNOSIS: Transient ischemic attack.  DISCHARGE DIAGNOSES:  1. Transient ischemic attack with right-sided numbness, tingling, resolved.  2. Peripheral vascular disease.  3. Carotid stenosis, bilateral.  4. Hypertension.  5. Hyperlipidemia.  6. LDL 54.  7. Chronic pain syndrome. 8. Depression.  9. History of CVA with left-sided weakness, chronic.  10. Status post left AKA due to peripheral vascular disease.  11. Bilateral carotid stenosis. The patient refused further work-up. 12. History of cerebrovascular accident with residual left-sided stenosis. The patient uses wheelchair.  DISCHARGE CONDITION: Stable.   DISCHARGE MEDICATIONS: The patient is to resume her outpatient medications which are:  1. Citalopram 20 mg p.o. daily. 2. Clopidogrel 75 mg p.o. daily. 3. Docusate 100 mg p.o. daily. 4. Lyrica 150 mg p.o. twice daily. 5. Tramadol 50 mg p.o. daily. 6. Trazodone 150 mg p.o. at bedtime.  7. Vitamin D2 50,000 units 1 capsule monthly.  8. Mapap 500 mg 2 tablets every eight hours.  9. Ranitidine 15 mg/mL oral syrup two teaspoonfuls, which would be 10 mL, orally once daily at bedtime for heartburn.  10. Tramadol 100 mg/24 hours oral tablet extended-release tablet daily at bedtime. 11. Oxybutynin 10 mg/24 hours 1 tablet once daily at bedtime.  The patient is to hold indefinitely pantoprazole.  The patient is to change Caduet from 5/20 to 5/40 p.o. once daily dose.  NEW MEDICATIONS:  1. Alprazolam 0.25 mg every eight hours as needed.  2. Aspirin 81 mg p.o. daily.   HOME OXYGEN: None.   DIET: 2 gram salt.   ACTIVITY LIMITATIONS: As tolerated.   REFERRALS: Home health physical therapy as well as Therapist, sports.  RETURN TO WORK: Not applicable.   FOLLOW-UP:  1. Follow-up with Dr. Patsey Berthold in two days after discharge. 2. Follow-up with Dr.  Leotis Pain in one week after discharge.    CONSULTANTS:  1. Dr. Delana Meyer   2. Dr. Lucky Cowboy  3. Care Management    RADIOLOGIC STUDIES:  1. Chest, portable, single view, 07/02/2011, showed questionable nodule in the right apex. Follow-up chest x-ray or CT of chest was suggested. No acute cardiopulmonary disease was noted. 2. CT scan of head without contrast, 07/02/2011, revealed encephalomalacia in right frontoparietal region consistent with old stroke. No acute abnormality. No hemorrhage. 3. MRI of brain without contrast, 07/03/2011, showed chronic changes without evidence of acute abnormalities.  4. Ultrasound of carotid arteries bilaterally showed findings concerning for hemodynamically significant stenosis within the left carotid system. A stent has been placed within the common carotid artery on the right. The spectral waveform imaging and color flow imaging was unremarkable within the stent. There are findings concerning for possibly hemodynamically significant stenosis at the origin of the right internal carotid artery. Vascular interventional consultation was recommended if clinically warranted according to the radiologist.  5. Echocardiogram showed normal chamber size and normal systolic function with mild diastolic dysfunction and mild MR. 6. CTA of carotid arteries on 07/05/2011 showed near occlusive stenosis of right carotid bifurcation stent. High-grade stenosis of carotid bifurcation. Possible left subclavian stenosis (prevertebral) and this may be high-grade according to the radiologist.   HISTORY OF PRESENT ILLNESS: The patient is a 64 year old Caucasian female with history of stroke in the past with residual left-sided weakness and also history of peripheral vascular disease who presented to the hospital with complaints of right hand tingling sensation as well  as numbness. She also had questionable expressive aphasia but she was not really sure about that. She had some weakness of her right  hand. She was not able to hold her cell phone. Please refer to Dr. Keenan Bachelor admission note on 07/02/2011.   PHYSICAL EXAMINATION: On arrival to the Emergency Room, the patient's temperature is 97.9, pulse 96, respiration rate 18, blood pressure 148/92, saturation was 100% on room air. Physical exam was remarkable for left-sided weakness and left above-knee amputation.   LABORATORY DATA: The patient's lab data were unremarkable including BMP, liver enzymes as well as cardiac enzymes. The patient was mildly anemic with hemoglobin level of 11.5. Coagulation panel was unremarkable except activated PTT was elevated to 39.2.   EKG showed normal sinus rhythm at 91 beats minutes, possible voltage criteria for LVH, may be normal variant according to EKG criteria, possible anterior infarct, age undetermined. Otherwise, nonspecific ST-T changes were noted.   Chest x-ray was unremarkable except of small questionable nodule in the right apex which was noted on x-ray.  HOSPITAL COURSE: The patient was admitted to the hospital. Work-up for questionable TIA was entertained. The patient underwent MRI of her brain which was unremarkable. Also, she had carotid ultrasound which was remarkable for bilateral high-grade stenosis. CT of carotid arteries was performed which was also very concerning. A Vascular Surgery consultation was obtained. The patient was evaluated by Dr. Lucky Cowboy who saw the patient in consultation on 07/04/2011. Dr. Lucky Cowboy felt the patient has extensive vascular history with left AKA after multiple previous revascularizations. Her ultrasound read as less than 50% stenosis but on official report bilateral hemodynamically significant stenosis was suggested. It was confusing wording and that is why Dr. Lucky Cowboy recommended further evaluation with CT angiogram. For this reason, CT angiogram was performed and CT angiogram revealed significant stenosis in both sides. However, Dr. Lucky Cowboy reviewed CTA and he felt that there was  no significant lesion on the left, however, on the right side there was a significant lesion. He discussed treatment with the patient and the patient was not agreeable to surgery. However, she agreed to possible stent placement. Dr. Delana Meyer recommended to discharge the patient on maximal antiplatelet therapy and follow-up with him in approximately one week and make decisions about therapy of the patient's left carotid artery. The patient's condition was discussed with Dr. Manuella Ghazi, neurologist, who felt that in view of bilateral carotid stenosis Coumadin therapy would be probably indicated. However, in discussing with the patient Coumadin therapy she refused Coumadin therapy. Further decision was made to discontinue Protonix which could affect Plavix activity and aspirin was added. The patient is to continue those two medications until she is seen by Dr. Hortencia Pilar and decision is made about surgical carotid therapy in the future. The patient is to follow-up with her primary care physician in the next few days after discharge and make decisions about follow-up with neurologist as well. The patient will be given follow-up with neurologist, Dr. Manuella Ghazi, as outpatient as well at first available appointment with Dr. Manuella Ghazi. The patient will resume her home health services with physical therapy as well as RN. She is being discharged in stable condition with the above-mentioned medications and follow-up. Her vital signs are stable with temperature of 98.1, pulse 86, respiration rate 18, blood pressure 112/73, saturation 91% to 93% on room air at rest.   The patient's lipid panel was investigated. The patient's LDL was found to be 54. The patient's total cholesterol was found to be 135, triglycerides 140,  and HDL was 53. It is recommended to continue the patient's current medications. However, it appears that even low cholesterol levels are somewhat risky for this patient with extensive peripheral vascular disease. For this  reason the patient's Lipitor was advanced to 40 mg p.o. daily dose. It is recommended to follow the patient's LDL levels and make decision to decrease lipid dose if the patient's LDL drops significantly below 50 because of the risk of intracranial bleed for LDLs below 50.   In regards to hypertension, the patient's blood pressure was initially found to be somewhat elevated on admission, however, that was felt to be due to stress. The patient's blood pressure was found to be quite well controlled on 5 mg dose of Norvasc in the hospital. No other modification risk factors were found.   DISPOSITION: The patient is being discharged in stable condition with the above-mentioned medications and follow-up.     TIME SPENT: 40 minutes.  ____________________________ Theodoro Grist, MD rv:drc D: 07/07/2011 17:40:00 ET T: 07/08/2011 12:02:47 ET JOB#: [id number]  cc: Theodoro Grist, MD, <Dictator> Algernon Huxley, MD  Rollins MD ELECTRONICALLY SIGNED 07/31/2011 11:10

## 2014-05-25 NOTE — H&P (Signed)
PATIENT NAME:  Emily Kidd, Emily Kidd MR#:  [id number] DATE OF BIRTH:  11-13-1950  DATE OF ADMISSION:  07/02/2011  PRIMARY CARE PHYSICIAN: Dr. Patsey Berthold from [place]: Patient is a 64 year old African American female with past medical history significant for history of peripheral vascular disease, history of left above-knee amputation, history of hyperlipidemia, history of former tobacco abuse as well as cerebrovascular accident with left hemiparesis who presented to hospital with complaints of right upper extremity tingling and weakness. According to patient, she was doing well up until approximately 3:00 p.m. on the day of admission when she started having slurring of speech. She also had some possible expressive aphasia but she is not sure about that. She also had right hand, finger numbness as well as weakness in right hand. She stated that she was not able to hod her cell phone. It lasted at least four hours and now she is back to normal self. She had CT scan of her head done which showed old infarct but hospitalist services were contacted for admission as no other studies were available over the past three years. She also is complaining of left upper quadrant abdominal pain which is going on for the past one month, constant pain, increasing after she eats, approximately 20 minutes later she would have significant discomfort and pain as well as constant nausea. She denies any vomiting, denies any diarrhea or constipation and denies any weight loss, denies any blood in stool or black or tarry-looking stools. She stated that she actually gained weight, approximately 40 pounds, in the past one year ago since her stroke in February 2012.   PAST MEDICAL HISTORY: 1. History of peripheral vascular disease. 2. History of left lower extremity ischemia.  3. Rest pain status post left external iliac to left profunda femoris artery bypass, also fem-to-fem bypass. 4. Left above-knee  amputation.  5. History of hyperlipidemia.  6. Former tobacco abuse, quit approximately three years ago  77. History of neuropathy.  8. History of cerebrovascular accident with left her with slurring of speech as well as left hemiparesis more in left upper extremity than left lower extremity in February 2012.   9. History of hypertension.  10. Chronic pain syndrome.  11. Patient does have depression. 12. Multiple vascular surgeries.   MEDICATIONS:  1. Ranitidine 75 mg p.o. at bedtime.  2. Tylenol 1 gram 3 times daily. 3. Lyrica 150 mg p.o. twice daily. 4. Tramadol 50 mg p.o. daily. 5. Protonix 40 mg p.o. daily.  6. Caduet 5/20 once daily. 7. Oxybutynin 5 mg p.o. daily.  8. Vitamin D 1.25 mg p.o. monthly.  9. Plavix 75 mg p.o. daily.  10. Citalopram 20 mg p.o. daily.  11. Trazodone 150 mg p.o. daily.  PAST SURGICAL HISTORY: As above.   ALLERGIES: According to medical records patient is allergic to penicillin which gives her hives.   SOCIAL HISTORY: Does not smoke now, used smoked 1/2 pack per day for 11 years, quit three years ago. She is widowed since two years ago. Lives with her mother. No kids. No alcohol. She used to work as [employer]ar[employer] manager.   FAMILY HISTORY: No stroke, hypertension, coronary artery disease.   REVIEW OF SYSTEMS: CONSTITUTIONAL: Positive for pains in left upper quadrant of abdomen especially with food intake but weight gain, some chest pains intermittently. She tells me she usually has gastroesophageal reflux disease related to chest pains. Also intermittent nausea and abdominal pains. Otherwise denies any fevers, chills, fatigue, weakness, weight  loss. EYES: In regards to eyes, denies any blurry vision, double vision, glaucoma, cataracts. ENT: Denies any tinnitus, allergies, epistaxis, sinus pains, difficulty swallowing. RESPIRATORY: Denies any cough, wheezes, asthma, chronic obstructive pulmonary disease. CARDIOVASCULAR: Denies orthopnea, arrhythmia, palpitations, or syncope.  GASTROINTESTINAL: Denies any vomiting, diarrhea, or constipation. GENITOURINARY: Denies dysuria, hematuria, frequency, incontinence. ENDOCRINOLOGY: Denies any polydipsia, nocturia, thyroid problems, heat or cold intolerance, or thirst. HEMATOLOGIC: Denies anemia, easy bruising, bleeding, swollen glands. SKIN: Denies any acne, rashes, lesions, change in moles. MUSCULOSKELETAL: Denies arthritis, cramps, swelling, gout. NEUROLOGICAL: No numbness, epilepsy, tremor. PSYCHIATRIC: Denies anxiety, insomnia or depression.    PHYSICAL EXAMINATION: VITAL SIGNS: On arrival to the hospital: Temperature 97.9, pulse 96, respiration rate 18, blood pressure 148/92, saturation 100% on room air.   GENERAL: Well nourished African American female in no significant distress laying on the stretcher.  HEENT: Her pupils are equal, reactive to light. Extraocular movements are intact. No icterus or conjunctivitis. Has normal hearing. No pharyngeal erythema. Mucosa is moist. Patient is edentulous.   NECK: No masses, supple, nontender. Thyroid not enlarged. No adenopathy. No JVD or carotid bruits bilaterally. Full range of motion.   LUNGS: Clear to auscultation in all fields. No rales or rhonchi, diminished breath sounds or wheezing. No labored inspirations, increased effort, dullness to percussion, overt respiratory distress.   CARDIOVASCULAR: S1, S2 appreciated. No murmurs, gallops or rubs noted. Rhythm was regular. PMI not lateralized. Chest is nontender to palpation.   EXTREMITIES: Right lower extremity pedal pulse was normal, slightly diminished. No calf tenderness or cyanosis was noted. Left lower extremity, however, has above-knee amputation.    ABDOMEN: Soft, nontender, however, patient does have deep tenderness in left flank, left upper abdominal area. No hepatosplenomegaly or masses were noted. Bowel sounds are present, but diminished.  RECTAL: Deferred.    MUSCULOSKELETAL: Able to move all extremities. No  cyanosis, degenerative joint disease, or kyphosis. Gait is not tested.   SKIN: Skin did not reveal any rashes, lesions, erythema, nodularity, induration. It was warm and dry to palpation.   LYMPH: No adenopathy in cervical region.   NEUROLOGIC: Cranial nerves grossly intact. Sensory is intact. Patient does have left upper extremity weakness as well as contracture. She has some dysarthria.   PSYCH: She is alert, oriented to time, person, place, cooperative. Memory is somewhat impaired, but no significant confusion, agitation, depression noted.   LABORATORY, DIAGNOSTIC, AND RADIOLOGICAL DATA: BMP within normal limits except of chloride 109. Patient's liver enzymes are normal. CBC: White blood cell count 4.8, hemoglobin 11.5, platelet count 286, absolute neutrophil count 2.6, which is within normal limits. Coagulation panel showed elevated activated PTT to 39.5, otherwise, pro time as well as INR within normal limits. EKG normal sinus rhythm at 91 beats per minute, normal axis, minimal voltage criteria for left ventricular hypertrophy, may be normal variant according to EKG criteria, possible anterior infarct, age undetermined, with poor R wave progression in V3. Nonspecific ST-T changes were noted.   CT scan of head without contrast 07/02/2011 showed encephalomalacia in right frontoparietal region consistent with old stroke. No acute abnormalities or hemorrhage were noted.   ASSESSMENT AND PLAN:  1. Transient ischemic attack with right-sided numbness and weakness as well as slurring of speech, worsening of slurring of speech. Admit patient to medical floor. Continue her on Plavix. Get echocardiogram repeated as well as a carotid ultrasound. Advance patient's Lipitor to 40 mg p.o. daily dose instead of 20. Get lipid panel.  2. Left upper quadrant abdominal pain of unclear  etiology, however, worsening with food intake. Will increase patient's PPIs to twice a day. Will get gastroenterologist evaluation  for scheduling EGD.  3. Anemia. Get guaiac.  4. Hypertension. Continue outpatient medications with Norvasc.  5. Chest pains, likely gastroesophageal reflux disease. Advance PPIs to twice daily dose. Continued ranitidine.  6. History of hyperlipidemia. As mentioned above will advance Lipitor to twice daily dose. Will check patient's lipid panel.  7. Neuropathy. Will continue patient on Lyrica. 8. Chronic pain syndrome. Advance tramadol to q.6 hours, decrease Tylenol to as needed.    TIME SPENT: 50 minutes.   ____________________________ Theodoro Grist, MD rv:cms D: 07/02/2011 19:45:17 ET T: 07/03/2011 08:28:10 ET JOB#: [id number]  cc: Theodoro Grist, MD, <Dictator> Jomarie Longs, DO [place] MD ELECTRONICALLY SIGNED 07/05/2011 19:05

## 2014-05-25 NOTE — Consult Note (Signed)
Asked to see patient regarding TIA symptoms.  Apparently had speech issues and right hand numbness.  She has an extensive vascular history with left AKA after multiple previous revascularizations.  Her [place] reads in the body of the report <50% stenosis but in the official report bilateral hemodynamically significant stenosis is suggested.  Confusing wording, and will need further evaluation.  Have ordered Ct angiogram for tomorrow am and Dr. Delana Meyer will be by to see her after the scan tomorrow.  Electronic Signatures: Algernon Huxley (MD)  (Signed on 03-Jun-13 14:26)  Authored  Last Updated: 03-Jun-13 14:26 by Algernon Huxley (MD)

## 2014-06-01 NOTE — Consult Note (Signed)
PATIENT NAME:  Emily Kidd, Emily Kidd MR#:  [id number] DATE OF BIRTH:  1950/04/07  DATE OF CONSULTATION:  03/03/2014  REFERRING PHYSICIAN:   CONSULTING PHYSICIAN:  Dionisio David, MD  INDICATION FOR CONSULTATION: Chest pain.   HISTORY OF PRESENT ILLNESS: This is a 64 year old, African American female, with a past medical history of peripheral vascular disease, hyperlipidemia, CVA, who presented to the Emergency Room with chest pain. The chest pain is sharp, intermittent heaviness in the chest associated with shortness of breath and diaphoresis. She was given nitroglycerin with some relief of chest pain, but continues to have intermittent chest pain. First 2 sets of cardiac enzymes are negative.   PAST MEDICAL HISTORY: Hyperlipidemia, neuropathy, CVA, hypertension, chronic pain syndrome, depression.   PAST SURGICAL HISTORY: She has a left above-knee amputation.   FAMILY HISTORY: No history of hypertension, CAD or stroke.   SOCIAL HISTORY: She is a former smoker, but currently has stopped smoking about 2 years ago. No history of EtOH abuse or illicit drug use.   ALLERGIES: PENICILLIN.   HOME MEDICATIONS: Promethazine, ranitidine, Ventolin inhalers, Lyrica 150 b.i.d., lorazepam 0.5 mg b.i.d.   REVIEW OF SYSTEMS: Unremarkable.   PHYSICAL EXAMINATION:  GENERAL: She is alert and oriented x 3, in no acute distress.  VITAL SIGNS: Blood pressure is 112/65, respirations 18, pulse is 92, temperature 98.3, saturation is 92.  NECK: No JVD. No carotid bruit.  LUNGS: Clear. No rales or rhonchi.  HEART: Regular rate and rhythm. Normal S1, S2. No audible murmur.  ABDOMEN: Soft, nontender, positive bowel sounds.  EXTREMITIES: No pedal edema.  NEUROLOGIC: She appears to be intact.   LABORATORIES AND STUDIES: EKG shows sinus tachycardia, no acute EKG changes.   Two sets of cardiac enzymes are negative. Third set is pending.   ASSESSMENT AND PLAN: Atypical chest pain, but does have multiple risk factors for  coronary artery disease. She had a D-dimer that was elevated, but there was no evidence of pulmonary embolism. She did have some unchanged bilateral pulmonary nodules on the CT chest. Advise getting a nuclear stress test, which will be done today. Advise proceeding with a stress test.   Thank you very much for the referral.    ____________________________ Dionisio David, MD sak:JT D: 03/03/2014 09:10:40 ET T: 03/03/2014 09:44:22 ET JOB#: [id number]  cc: Dionisio David, MD, <Dictator> Dionisio David MD ELECTRONICALLY SIGNED 04/03/2014 12:13

## 2014-06-01 NOTE — Discharge Summary (Signed)
PATIENT NAME:  Emily Kidd, Emily Kidd MR#:  [id number] DATE OF BIRTH:  [birth year]  DATE OF ADMISSION:  03/02/2014 DATE OF DISCHARGE:  03/03/2014  DISCHARGE DIAGNOSES:  1.  Musculoskeletal chest pain.  2.  Allergic conjunctivitis of the left eye.  3.  Hypertension.  4.  History of cerebrovascular accident.  5.  Hyperlipidemia.  6.  Depression.  7.  Chronic pain syndrome.  8.  Neuropathy.  9.  Anxiety.  10.  Asthma.   CONSULTANTS: Dr. Humphrey Rolls with cardiology.   IMAGING STUDIES:  1.   CT scan of the chest with contrast with PE protocol showed no pulmonary embolism, showed some chronic changes.  2.  Nuclear stress test showed ejection fraction of 58%. No wall motion abnormalities. Normal study.  3.  Right lower extremity Doppler showed no DVT swelling fluid.   ADMITTING HISTORY AND PHYSICAL:  Please see detailed H and P dictated by Dr. Bridgett Larsson. In brief, a 64 year old female patient with history of peripheral vascular disease, hyperlipidemia, CVA and hypertension presented to the hospital complaining of chest pain on-and-off. The patient with normal cardiac enzymes was admitted for a stress test. The patient was on a tele floor, did well, nothing on the telemetry, no arrhythmias and no ST changes. Stress test was normal. CT scan of the chest showed no pulmonary embolism. During the hospital stay, the patient did have an episode of a tearing and blurred vision of the right eye which was secondary to allergic conjunctivitis for which her eye drops were started and this resolved.   Prior to discharge, the patient is back to baseline. Lung sounds clear. S1, S2 heard. Has left bony amputation.   DISCHARGE MEDICATIONS:   Please refer to discharge medication list.   DISCHARGE INSTRUCTIONS: Low-sodium, low-fat diet. Activity as tolerated. Follow up with the primary care physician in 1 to 2 weeks.   TIME SPENT:  On day of discharge in discharge activity was 40 minutes again     ____________________________ Leia Alf. Brenden Rudman, MD srs:at D: 03/03/2014 16:28:37 ET T: 03/03/2014 17:36:02 ET JOB#: [id number]  cc: Alveta Heimlich R. Anvita Hirata, MD, <Dictator> Neita Carp MD ELECTRONICALLY SIGNED 03/10/2014 3:54

## 2014-06-01 NOTE — H&P (Signed)
PATIENT NAME:  Emily Kidd, Emily Kidd MR#:  [id number] DATE OF BIRTH:  Nov 25, 1950  DATE OF ADMISSION:  03/02/2014  PRIMARY CARE PHYSICIAN: Ivin Booty A. Coralie Common, MD  REFERRING PHYSICIAN: Sheryl L. Benjaman Lobe, MD  CHIEF COMPLAINT: Chest pain on and off.   HISTORY OF PRESENT ILLNESS: A 64 year old female with a history of PVD, hyperlipidemia, CVA, presented to the ED with the above chief complaint. The patient is alert, awake, oriented, in no acute distress. The patient said that she has had chest pain intermittently for the past several weeks, but started to have chest pain today which is in the substernal area, aching with right shoulder pain. The patient also complains of nausea and palpitation, but denies any diaphoresis, vomiting. Denies any other symptoms. The patient's troponin level is less than 0.02 twice, but she was treated with nitroglycerin twice in the ED but still has chest pain.   PAST MEDICAL HISTORY: PVD, hyperlipidemia, neuropathy, CVA, hypertension, chronic pain syndrome, depression.   PAST SURGICAL HISTORY: Left AKA.   FAMILY HISTORY: No hypertension, CAD, or stroke.   SOCIAL HISTORY: She was a former smoker. No alcohol, drinking or illicit drugs.   ALLERGIES: PENICILLIN.   HOME MEDICATIONS:  1.  Ventolin HFA CFC 90 mcg 1 puff every 6 hours p.r.n.  2.  Trazodone 150 mg p.o. at bedtime. 3.  Trazodone 50 mg p.o. b.i.d.,  4.  Tramadol 50 mg p.o. b.i.d. p.r.n. for pain.  5.  Senna lax 25 mg p.o. once a day at bedtime. 6.  Ranitidine 150 mg p.o. daily. 7.  Promethazine 25 mg 1 to 2 tablets every 4-6 hours p.r.n. 8.  Oxybutynin 5 mg p.o. once a day at bedtime. 9.  Melatonin 3 mg p.o. once a day at bedtime.  10.  Lyrica 150 mg p.o. b.i.d.  11.  Lorazepam 0.5 mg p.o. b.i.d.  12.  Citalopram 20 mg p.o. at bedtime. 13.  Benzonatate 100 mg 1 capsule once a day for cough.  REVIEW OF SYSTEMS: CONSTITUTIONAL: The patient denies any fever or chills. No headache or dizziness. No weakness.   EYES: No double vision or blurry vision.  EARS, NOSE, AND THROAT: No postnasal drip, slurred speech, or dysphagia.  CARDIOVASCULAR: Positive for chest pain, palpitation. No orthopnea or nocturnal dyspnea. No leg edema.  PULMONARY: No cough, sputum, shortness of breath, or hemoptysis.  GASTROINTESTINAL: No abdominal pain, nausea, vomiting, diarrhea. No melena or bloody stool.   GENITOURINARY: No dysuria, hematuria, or incontinence.  SKIN: No rash or jaundice.  NEUROLOGY: No syncope, loss of consciousness, or seizure.  ENDOCRINE: No polyuria, polydipsia, heat or cold intolerance.  HEMATOLOGY: No easy bruising or bleeding.   PHYSICAL EXAMINATION:  VITAL SIGNS: Temperature 98.4, blood pressure 143/77, pulse 112, oxygen saturation 93% on room air.  GENERAL: The patient is alert, awake, oriented, in no acute distress.  HEENT: Pupils round, equal, reactive to light and accommodation. Moist oral mucosa. Clear oropharynx.  NECK: Supple. No JVD or carotid bruit. There is no lymphadenopathy. No thyromegaly.  CARDIOVASCULAR: S1 and S2. Regular rate and rhythm. No murmurs or gallops.  PULMONARY: Bilateral air entry. No wheezing or rales. No use of accessory muscles to breathe.  ABDOMEN: Soft. No distention or tenderness. No organomegaly. Bowel sounds present.  EXTREMITIES: Left AKA. No edema, clubbing, or cyanosis. No calf tenderness. Weak pedal pulse on the right lower extremity.  SKIN: No rash or jaundice.  NEUROLOGIC: A and O x 3. No focal deficit. Power 4/5. Sensation intact.   LABORATORY DATA:  CT angiogram of chest: No PE, intrinsic bilateral pulmonary nodular densities, previously evaluated.   Troponin less than 0.02 twice.   Chest x-ray: No acute cardiopulmonary disease.   CBC in normal range. Glucose 94, BUN 8, creatinine 0.74. Electrolytes are normal. BNP 14. INR 1.0. D-dimer 1163.   EKG showed normal sinus rhythm at 87 BPM.   IMPRESSION:  1.  Chest pain, unstable angina.  2.   Hypertension.  3.  Peripheral vascular disease.  4.  Hyperlipidemia. 5.  Elevated D-dimer.   PLAN OF TREATMENT:  1.  The patient will be placed for observation. We will get a stress test tomorrow morning. I will start aspirin and Lipitor with nitroglycerin p.o. p.r.n. for chest pain. 2.  For hypertension. In addition, I will give Lopressor b.i.d.  3.  For elevated D-dimer, the patient's CT angiogram of chest did not show any pulmonary embolism. I will get a right lower extremity venous duplex to rule out deep venous thrombosis. 4.  Continue the patient's other home medication.   I discussed the patient's condition and plan of treatment with the patient.   CODE STATUS: The patient wants full code.   TIME SPENT: About 55 minutes.    ____________________________ Demetrios Loll, MD qc:TT D: 03/02/2014 22:38:51 ET T: 03/02/2014 22:53:38 ET JOB#: [id number]  cc: Demetrios Loll, MD, <Dictator> Demetrios Loll MD ELECTRONICALLY SIGNED 03/03/2014 14:17

## 2014-11-11 ENCOUNTER — Other Ambulatory Visit: Payer: Self-pay | Admitting: Family Medicine

## 2014-11-11 DIAGNOSIS — R918 Other nonspecific abnormal finding of lung field: Secondary | ICD-10-CM

## 2014-11-15 ENCOUNTER — Emergency Department: Payer: Medicare (Managed Care)

## 2014-11-15 ENCOUNTER — Inpatient Hospital Stay
Admission: EM | Admit: 2014-11-15 | Discharge: 2014-11-21 | DRG: 871 | Disposition: A | Discharge status: Home / Self Care | Payer: Medicare (Managed Care) | Attending: Internal Medicine | Admitting: Internal Medicine

## 2014-11-15 ENCOUNTER — Encounter: Payer: Self-pay | Admitting: Emergency Medicine

## 2014-11-15 DIAGNOSIS — E876 Hypokalemia: Secondary | ICD-10-CM | POA: Diagnosis present

## 2014-11-15 DIAGNOSIS — F329 Major depressive disorder, single episode, unspecified: Secondary | ICD-10-CM | POA: Diagnosis present

## 2014-11-15 DIAGNOSIS — Z89612 Acquired absence of left leg above knee: Secondary | ICD-10-CM

## 2014-11-15 DIAGNOSIS — R0902 Hypoxemia: Secondary | ICD-10-CM

## 2014-11-15 DIAGNOSIS — R042 Hemoptysis: Secondary | ICD-10-CM

## 2014-11-15 DIAGNOSIS — D649 Anemia, unspecified: Secondary | ICD-10-CM | POA: Diagnosis present

## 2014-11-15 DIAGNOSIS — A409 Streptococcal sepsis, unspecified: Secondary | ICD-10-CM | POA: Diagnosis not present

## 2014-11-15 DIAGNOSIS — G629 Polyneuropathy, unspecified: Secondary | ICD-10-CM

## 2014-11-15 DIAGNOSIS — R1310 Dysphagia, unspecified: Secondary | ICD-10-CM | POA: Diagnosis present

## 2014-11-15 DIAGNOSIS — R52 Pain, unspecified: Secondary | ICD-10-CM

## 2014-11-15 DIAGNOSIS — Z8673 Personal history of transient ischemic attack (TIA), and cerebral infarction without residual deficits: Secondary | ICD-10-CM

## 2014-11-15 DIAGNOSIS — R451 Restlessness and agitation: Secondary | ICD-10-CM | POA: Diagnosis present

## 2014-11-15 DIAGNOSIS — J189 Pneumonia, unspecified organism: Secondary | ICD-10-CM | POA: Diagnosis not present

## 2014-11-15 DIAGNOSIS — J159 Unspecified bacterial pneumonia: Secondary | ICD-10-CM | POA: Diagnosis present

## 2014-11-15 DIAGNOSIS — J181 Lobar pneumonia, unspecified organism: Secondary | ICD-10-CM

## 2014-11-15 DIAGNOSIS — G9341 Metabolic encephalopathy: Secondary | ICD-10-CM

## 2014-11-15 DIAGNOSIS — E119 Type 2 diabetes mellitus without complications: Secondary | ICD-10-CM | POA: Diagnosis present

## 2014-11-15 DIAGNOSIS — A419 Sepsis, unspecified organism: Secondary | ICD-10-CM

## 2014-11-15 DIAGNOSIS — K219 Gastro-esophageal reflux disease without esophagitis: Secondary | ICD-10-CM

## 2014-11-15 DIAGNOSIS — I10 Essential (primary) hypertension: Secondary | ICD-10-CM | POA: Diagnosis present

## 2014-11-15 DIAGNOSIS — J9601 Acute respiratory failure with hypoxia: Secondary | ICD-10-CM

## 2014-11-15 DIAGNOSIS — Z880 Allergy status to penicillin: Secondary | ICD-10-CM

## 2014-11-15 DIAGNOSIS — I739 Peripheral vascular disease, unspecified: Secondary | ICD-10-CM

## 2014-11-15 DIAGNOSIS — Z87891 Personal history of nicotine dependence: Secondary | ICD-10-CM

## 2014-11-15 DIAGNOSIS — J449 Chronic obstructive pulmonary disease, unspecified: Secondary | ICD-10-CM

## 2014-11-15 HISTORY — DX: Essential (primary) hypertension: I10

## 2014-11-15 LAB — CBC WITH DIFFERENTIAL/PLATELET
BASOS ABS: 0 10*3/uL (ref 0–0.1)
BASOS PCT: 0 %
Eosinophils Absolute: 0.3 10*3/uL (ref 0–0.7)
Eosinophils Relative: 3 %
HEMATOCRIT: 39.9 % (ref 35.0–47.0)
HEMOGLOBIN: 13.1 g/dL (ref 12.0–16.0)
Lymphocytes Relative: 18 %
Lymphs Abs: 1.4 10*3/uL (ref 1.0–3.6)
MCH: 27.8 pg (ref 26.0–34.0)
MCHC: 32.7 g/dL (ref 32.0–36.0)
MCV: 85.1 fL (ref 80.0–100.0)
Monocytes Absolute: 0.7 10*3/uL (ref 0.2–0.9)
Monocytes Relative: 9 %
NEUTROS ABS: 5.6 10*3/uL (ref 1.4–6.5)
NEUTROS PCT: 70 %
Platelets: 328 10*3/uL (ref 150–440)
RBC: 4.7 MIL/uL (ref 3.80–5.20)
RDW: 15.9 % — ABNORMAL HIGH (ref 11.5–14.5)
WBC: 8.1 10*3/uL (ref 3.6–11.0)

## 2014-11-15 MED ORDER — SODIUM CHLORIDE 0.9 % IV BOLUS (SEPSIS)
1000.0000 mL | Freq: Once | INTRAVENOUS | Status: AC
  Administered 2014-11-16: 1000 mL via INTRAVENOUS

## 2014-11-15 NOTE — ED Notes (Signed)
Pt arrives to ED via EMS from home with c/o CP.  Pain is midsternal, radiates to L arm, 10/10, and sharp.  Pt also with H/A, SOB, mouth pain, and "spitting up blood".

## 2014-11-15 NOTE — ED Provider Notes (Signed)
[facility] Emergency Department Provider Note  ____________________________________________  Time seen: Approximately 11:15 PM  I have reviewed the triage vital signs and the nursing notes.   HISTORY  Chief Complaint Chest Pain    HPI Emily Kidd is a 64 y.o. female who presents to the ED from home via EMS with a chief complaint of coughing up blood and chest pain. Patient was diagnosed with pneumonia and placed on Levaquin 5 days ago. Complains of progressive shortness of breath associated with blood-tinged, brown sputum. Notes chills, chest pain, headache and shortness of breath. She was also treated with antibiotics 3 weeks prior for presumed pneumonia. Chest x-ray at outside facility showed density in left upper lobe concerning for cancer; CT chest was ordered by her doctor for next week. Patient has a history of COPD, is not on continuous oxygen at baseline. Denies recent trauma or travel.   Past medical history Pneumonia COPD GERD Diabetes Neuropathy Depression Peripheral vascular disease  There are no active problems to display for this patient.   Past surgical history Left AKA   No current outpatient prescriptions on file.  Allergies Penicillins Azithromycin  No family history on file.  Social History Social History  Substance Use Topics  . Smoking status: Former Research scientist (life sciences)  . Smokeless tobacco: None  . Alcohol Use: None    Review of Systems Constitutional: No fever/chills Eyes: No visual changes. ENT: No sore throat. Cardiovascular: Positive for chest pain. Respiratory: Positive for hemoptysis and shortness of breath. Gastrointestinal: No abdominal pain.  No nausea, no vomiting.  No diarrhea.  No constipation. Genitourinary: Negative for dysuria. Musculoskeletal: Negative for back pain. Skin: Negative for rash. Neurological: Negative for headaches, focal weakness or numbness.  10-point ROS otherwise  negative.  ____________________________________________   PHYSICAL EXAM:  VITAL SIGNS: ED Triage Vitals  Enc Vitals Group     BP 11/15/14 2102 162/76 mmHg     Pulse Rate 11/15/14 2102 99     Resp 11/15/14 2102 20     Temp 11/15/14 2102 98.6 F (37 C)     Temp Source 11/15/14 2102 Oral     SpO2 11/15/14 2102 90 %     Weight 11/15/14 2102 162 lb (73.483 kg)     Height --      Head Cir --      Peak Flow --      Pain Score 11/15/14 2103 10     Pain Loc --      Pain Edu? --      Excl. in [place]? --     Constitutional: Alert and oriented. Well appearing and in no acute distress. Eyes: Conjunctivae are normal. PERRL. EOMI. Head: Atraumatic. Nose: No congestion/rhinnorhea. Mouth/Throat: Mucous membranes are moist.  Oropharynx non-erythematous. Neck: No stridor.   Cardiovascular: Normal rate, regular rhythm. Grossly normal heart sounds.  Good peripheral circulation. Respiratory: Normal respiratory effort.  No retractions. Lungs with rhonchi on the left side. Gastrointestinal: Soft and nontender. No distention. No abdominal bruits. No CVA tenderness. Musculoskeletal: No lower extremity tenderness nor edema.  Left AKA. No joint effusions. Neurologic:  Normal speech and language. No gross focal neurologic deficits are appreciated.  Skin:  Skin is warm, dry and intact. No rash noted. Psychiatric: Mood and affect are normal. Speech and behavior are normal.  ____________________________________________   LABS (all labs ordered are listed, but only abnormal results are displayed)  Labs Reviewed  CBC WITH DIFFERENTIAL/PLATELET - Abnormal; Notable for the following:    RDW 15.9 (*)  All other components within normal limits  BASIC METABOLIC PANEL - Abnormal; Notable for the following:    Potassium 3.2 (*)    Calcium 8.7 (*)    All other components within normal limits  CULTURE, BLOOD (ROUTINE X 2)  CULTURE, BLOOD (ROUTINE X 2)  TROPONIN I  PROTIME-INR    ____________________________________________  EKG  ED ECG REPORT I, SUNG,JADE J, the attending physician, personally viewed and interpreted this ECG.   Date: 11/16/2014  EKG Time: 2108  Rate: 100  Rhythm: sinus tachycardia  Axis: Normal  Intervals:none  ST&T Change: Nonspecific  ____________________________________________  RADIOLOGY  Chest 2 view (viewed by me, interpreted per Dr. Jeannine Boga): 1. Parenchymal left upper lobe infiltrate, concerning for pneumonia. 2. Cardiomegaly without pulmonary edema.  ____________________________________________   PROCEDURES  Procedure(s) performed: None  Critical Care performed: No  ____________________________________________   INITIAL IMPRESSION / ASSESSMENT AND PLAN / ED COURSE  Pertinent labs & imaging results that were available during my care of the patient were reviewed by me and considered in my medical decision making (see chart for details).  64 year old female who presents with hemoptysis, on levaquin 5/7 days. Outside CXR suspicious for LUL cancer, although I suspect this is more likely pneumonia. Will obtain blood cultures, basic labwork, CT chest, administer IV antibiotics. Anticipate hospital admission.  ----------------------------------------- 2:25 AM on 11/16/2014 -----------------------------------------  Patient resting in no acute distress. Oxygen tubing around mouth; saturations 91%. Oxygen reapplied to nose. Discussed with hospitalist for evaluation in the ED for admission. ____________________________________________   FINAL CLINICAL IMPRESSION(S) / ED DIAGNOSES  Final diagnoses:  Community acquired pneumonia  Hypoxia      Emily Blanch, MD 11/16/14 [phone]

## 2014-11-16 ENCOUNTER — Emergency Department: Payer: Medicare (Managed Care)

## 2014-11-16 DIAGNOSIS — J9601 Acute respiratory failure with hypoxia: Secondary | ICD-10-CM | POA: Diagnosis present

## 2014-11-16 DIAGNOSIS — G629 Polyneuropathy, unspecified: Secondary | ICD-10-CM | POA: Diagnosis present

## 2014-11-16 DIAGNOSIS — Z8673 Personal history of transient ischemic attack (TIA), and cerebral infarction without residual deficits: Secondary | ICD-10-CM | POA: Diagnosis not present

## 2014-11-16 DIAGNOSIS — J189 Pneumonia, unspecified organism: Secondary | ICD-10-CM | POA: Diagnosis present

## 2014-11-16 DIAGNOSIS — J449 Chronic obstructive pulmonary disease, unspecified: Secondary | ICD-10-CM

## 2014-11-16 DIAGNOSIS — E119 Type 2 diabetes mellitus without complications: Secondary | ICD-10-CM | POA: Diagnosis present

## 2014-11-16 DIAGNOSIS — I739 Peripheral vascular disease, unspecified: Secondary | ICD-10-CM

## 2014-11-16 DIAGNOSIS — K219 Gastro-esophageal reflux disease without esophagitis: Secondary | ICD-10-CM

## 2014-11-16 DIAGNOSIS — A409 Streptococcal sepsis, unspecified: Secondary | ICD-10-CM | POA: Diagnosis present

## 2014-11-16 DIAGNOSIS — F329 Major depressive disorder, single episode, unspecified: Secondary | ICD-10-CM | POA: Diagnosis present

## 2014-11-16 DIAGNOSIS — J188 Other pneumonia, unspecified organism: Secondary | ICD-10-CM | POA: Diagnosis not present

## 2014-11-16 DIAGNOSIS — J159 Unspecified bacterial pneumonia: Secondary | ICD-10-CM | POA: Diagnosis present

## 2014-11-16 DIAGNOSIS — D649 Anemia, unspecified: Secondary | ICD-10-CM | POA: Diagnosis present

## 2014-11-16 DIAGNOSIS — E876 Hypokalemia: Secondary | ICD-10-CM | POA: Diagnosis present

## 2014-11-16 DIAGNOSIS — I10 Essential (primary) hypertension: Secondary | ICD-10-CM | POA: Diagnosis present

## 2014-11-16 DIAGNOSIS — Z880 Allergy status to penicillin: Secondary | ICD-10-CM | POA: Diagnosis not present

## 2014-11-16 DIAGNOSIS — Z87891 Personal history of nicotine dependence: Secondary | ICD-10-CM | POA: Diagnosis not present

## 2014-11-16 DIAGNOSIS — G9341 Metabolic encephalopathy: Secondary | ICD-10-CM | POA: Diagnosis present

## 2014-11-16 DIAGNOSIS — Z89612 Acquired absence of left leg above knee: Secondary | ICD-10-CM | POA: Diagnosis not present

## 2014-11-16 DIAGNOSIS — R1310 Dysphagia, unspecified: Secondary | ICD-10-CM | POA: Diagnosis present

## 2014-11-16 DIAGNOSIS — R451 Restlessness and agitation: Secondary | ICD-10-CM | POA: Diagnosis present

## 2014-11-16 DIAGNOSIS — I361 Nonrheumatic tricuspid (valve) insufficiency: Secondary | ICD-10-CM | POA: Diagnosis not present

## 2014-11-16 LAB — BASIC METABOLIC PANEL
ANION GAP: 7 (ref 5–15)
BUN: 6 mg/dL (ref 6–20)
CHLORIDE: 109 mmol/L (ref 101–111)
CO2: 26 mmol/L (ref 22–32)
CREATININE: 0.67 mg/dL (ref 0.44–1.00)
Calcium: 8.7 mg/dL — ABNORMAL LOW (ref 8.9–10.3)
GFR calc Af Amer: >60 mL/min (ref >60)
GLUCOSE: 94 mg/dL (ref 65–99)
POTASSIUM: 3.2 mmol/L — AB (ref 3.5–5.1)
SODIUM: 142 mmol/L (ref 135–145)

## 2014-11-16 LAB — PROTIME-INR
INR: 1.02
PROTHROMBIN TIME: 13.6 s (ref 11.4–15.0)

## 2014-11-16 LAB — TROPONIN I

## 2014-11-16 MED ORDER — PROMETHAZINE HCL 25 MG PO TABS
12.5000 mg | ORAL_TABLET | Freq: Four times a day (QID) | ORAL | Status: DC | PRN
  Administered 2014-11-16 – 2014-11-21 (×2): 12.5 mg via ORAL
  Filled 2014-11-16 (×2): qty 1

## 2014-11-16 MED ORDER — ACETAMINOPHEN 650 MG RE SUPP: 650.0000 mg | Freq: Four times a day (QID) | RECTAL | Status: DC | PRN

## 2014-11-16 MED ORDER — DEXTROSE 5 % IV SOLN
500.0000 mg | INTRAVENOUS | Status: DC
  Administered 2014-11-16 – 2014-11-21 (×6): 500 mg via INTRAVENOUS
  Filled 2014-11-16 (×7): qty 500

## 2014-11-16 MED ORDER — ATORVASTATIN CALCIUM 20 MG PO TABS
20.0000 mg | ORAL_TABLET | Freq: Every day | ORAL | Status: DC
  Administered 2014-11-16 – 2014-11-20 (×5): 20 mg via ORAL
  Filled 2014-11-16 (×6): qty 1

## 2014-11-16 MED ORDER — LORAZEPAM 1 MG PO TABS
1.0000 mg | ORAL_TABLET | Freq: Two times a day (BID) | ORAL | Status: DC | PRN
  Administered 2014-11-16 – 2014-11-20 (×5): 1 mg via ORAL
  Filled 2014-11-16 (×6): qty 1

## 2014-11-16 MED ORDER — IOHEXOL 350 MG/ML SOLN
75.0000 mL | Freq: Once | INTRAVENOUS | Status: AC | PRN
  Administered 2014-11-16: 100 mL via INTRAVENOUS

## 2014-11-16 MED ORDER — AMLODIPINE-ATORVASTATIN 5-20 MG PO TABS: 1.0000 | ORAL_TABLET | Freq: Every day | ORAL | Status: DC

## 2014-11-16 MED ORDER — METOPROLOL TARTRATE 1 MG/ML IV SOLN
5.0000 mg | Freq: Four times a day (QID) | INTRAVENOUS | Status: DC | PRN
  Filled 2014-11-16: qty 5

## 2014-11-16 MED ORDER — AMLODIPINE BESYLATE 5 MG PO TABS
5.0000 mg | ORAL_TABLET | Freq: Every day | ORAL | Status: DC
  Administered 2014-11-16 – 2014-11-20 (×5): 5 mg via ORAL
  Filled 2014-11-16 (×6): qty 1

## 2014-11-16 MED ORDER — POTASSIUM CHLORIDE CRYS ER 20 MEQ PO TBCR
40.0000 meq | EXTENDED_RELEASE_TABLET | Freq: Once | ORAL | Status: AC
  Administered 2014-11-16: 06:00:00 40 meq via ORAL
  Filled 2014-11-16: qty 2

## 2014-11-16 MED ORDER — SENNOSIDES-DOCUSATE SODIUM 8.6-50 MG PO TABS
1.0000 | ORAL_TABLET | Freq: Every evening | ORAL | Status: DC | PRN
  Administered 2014-11-18: 13:00:00 1 via ORAL
  Filled 2014-11-16: qty 1

## 2014-11-16 MED ORDER — ALBUTEROL SULFATE (2.5 MG/3ML) 0.083% IN NEBU
2.5000 mg | INHALATION_SOLUTION | Freq: Four times a day (QID) | RESPIRATORY_TRACT | Status: DC | PRN
  Administered 2014-11-17: 2.5 mg via RESPIRATORY_TRACT
  Filled 2014-11-16: qty 3

## 2014-11-16 MED ORDER — CITALOPRAM HYDROBROMIDE 20 MG PO TABS
20.0000 mg | ORAL_TABLET | Freq: Every day | ORAL | Status: DC
  Administered 2014-11-16 – 2014-11-21 (×6): 20 mg via ORAL
  Filled 2014-11-16 (×6): qty 1

## 2014-11-16 MED ORDER — ALUM & MAG HYDROXIDE-SIMETH 200-200-20 MG/5ML PO SUSP
30.0000 mL | Freq: Four times a day (QID) | ORAL | Status: DC | PRN
  Administered 2014-11-16 – 2014-11-19 (×3): 30 mL via ORAL
  Filled 2014-11-16 (×3): qty 30

## 2014-11-16 MED ORDER — VANCOMYCIN HCL IN DEXTROSE 1-5 GM/200ML-% IV SOLN
1000.0000 mg | Freq: Once | INTRAVENOUS | Status: AC
Start: 2014-11-16 — End: 2014-11-16
  Administered 2014-11-16: 06:00:00 1000 mg via INTRAVENOUS
  Filled 2014-11-16: qty 200

## 2014-11-16 MED ORDER — TRAZODONE HCL 50 MG PO TABS
150.0000 mg | ORAL_TABLET | Freq: Every evening | ORAL | Status: DC | PRN
  Administered 2014-11-16 – 2014-11-19 (×4): 150 mg via ORAL
  Filled 2014-11-16 (×4): qty 1

## 2014-11-16 MED ORDER — TUBERCULIN PPD 5 UNIT/0.1ML ID SOLN
5.0000 [IU] | Freq: Once | INTRADERMAL | Status: AC
  Administered 2014-11-16: 5 [IU] via INTRADERMAL
  Filled 2014-11-16: qty 0.1

## 2014-11-16 MED ORDER — VANCOMYCIN HCL IN DEXTROSE 1-5 GM/200ML-% IV SOLN
1000.0000 mg | Freq: Two times a day (BID) | INTRAVENOUS | Status: DC
  Administered 2014-11-16 – 2014-11-18 (×4): 1000 mg via INTRAVENOUS
  Filled 2014-11-16 (×6): qty 200

## 2014-11-16 MED ORDER — DEXTROSE 5 % IV SOLN
1.0000 g | Freq: Three times a day (TID) | INTRAVENOUS | Status: DC
  Administered 2014-11-16 – 2014-11-21 (×16): 1 g via INTRAVENOUS
  Filled 2014-11-16 (×20): qty 1

## 2014-11-16 MED ORDER — ACETAMINOPHEN 325 MG PO TABS
650.0000 mg | ORAL_TABLET | Freq: Four times a day (QID) | ORAL | Status: DC | PRN
  Administered 2014-11-16 – 2014-11-21 (×11): 650 mg via ORAL
  Filled 2014-11-16 (×12): qty 2

## 2014-11-16 MED ORDER — PREGABALIN 50 MG PO CAPS
100.0000 mg | ORAL_CAPSULE | Freq: Every day | ORAL | Status: DC
  Administered 2014-11-16 – 2014-11-21 (×6): 100 mg via ORAL
  Filled 2014-11-16 (×6): qty 2

## 2014-11-16 MED ORDER — DOXYCYCLINE HYCLATE 100 MG IV SOLR
100.0000 mg | Freq: Once | INTRAVENOUS | Status: AC
  Administered 2014-11-16: 100 mg via INTRAVENOUS
  Filled 2014-11-16: qty 100

## 2014-11-16 MED ORDER — GUAIFENESIN ER 600 MG PO TB12
600.0000 mg | ORAL_TABLET | Freq: Two times a day (BID) | ORAL | Status: DC
  Administered 2014-11-16 – 2014-11-21 (×11): 600 mg via ORAL
  Filled 2014-11-16 (×11): qty 1

## 2014-11-16 MED ORDER — OXYBUTYNIN CHLORIDE ER 10 MG PO TB24
10.0000 mg | ORAL_TABLET | Freq: Every day | ORAL | Status: DC
  Administered 2014-11-16 – 2014-11-20 (×5): 10 mg via ORAL
  Filled 2014-11-16 (×5): qty 1

## 2014-11-16 MED ORDER — AZTREONAM 1 G IJ SOLR: 1.0000 g | Freq: Three times a day (TID) | INTRAMUSCULAR | Status: DC

## 2014-11-16 NOTE — Plan of Care (Signed)
Problem: Discharge Progression Outcomes Goal: Other Discharge Outcomes/Goals Outcome: Progressing Plan of care progress to goals: Barriers- pt is a left AKA and lives at home with 64y/o mother Discharge- pt states that she would like to d/c back to home. O2 sats- 95-96% on 2liter oxygen via [place]. Pain- c/o headache, improved with Tylenol  Hemodynamically stable- afebrile on admission otherwise VSS. Diet- no c/o nausea or vomiting. IV antibiotics infusing per MD order.  Airborne Isolation room 128 neg. Pressure. Room

## 2014-11-16 NOTE — Progress Notes (Signed)
[facility] [place] NAME: Emily Kidd    MR#:  [id number]  DATE OF BIRTH:  Sep 18, 1950  SUBJECTIVE:  CHIEF COMPLAINT:   Chief Complaint  Patient presents with  . Chest Pain   patient is 64 year old female with history of diagnosis of pneumonia approximately 2 weeks ago who presents to the hospital for shortness of breath, chills and wheezing. She also noted some hemoptysis. In emergency room, she was noted to be hypoxic with O2 sats in 80s. Patient's chest x-ray revealed a left upper lobe pneumonia , CT scan of the chest revealed left upper lobe as well as left lower lobe pneumonia as well as 10 mm cavitary nodule in the right lower lobe . She was admitted for evaluation and treatment. PPD was placed, as well as QuantiFERON test was taken for TB, patient is placed in respiratory isolation. She feels somewhat better today. No hemoptysis. No shortness of breath  Review of Systems  Respiratory: Positive for cough, hemoptysis, sputum production, shortness of breath and wheezing.     VITAL SIGNS: Blood pressure 115/87, pulse 101, temperature 98.8 F (37.1 C), temperature source Oral, resp. rate 18, height '5\' 6"'$  (1.676 m), weight 72.576 kg (160 lb), SpO2 93 %.  PHYSICAL EXAMINATION:   GENERAL:  64 y.o.-year-old patient lying in the bed with no acute distress. Somnolent but comfortable EYES: Pupils equal, round, reactive to light and accommodation. No scleral icterus. Extraocular muscles intact.  HEENT: Head atraumatic, normocephalic. Oropharynx and nasopharynx clear.  NECK:  Supple, no jugular venous distention. No thyroid enlargement, no tenderness.  LUNGS: Normal breath sounds bilaterally, no wheezing, few rales and rhonchi , no crepitation. No use of accessory muscles of respiration.  CARDIOVASCULAR: S1, S2 normal. No murmurs, rubs, or gallops.  ABDOMEN: Soft, nontender, nondistended. Bowel sounds present. No organomegaly or mass.   EXTREMITIES: No pedal edema, cyanosis, or clubbing.  NEUROLOGIC: Cranial nerves II through XII are intact. Muscle strength 5/5 in all extremities. Sensation intact. Gait not checked.  PSYCHIATRIC: The patient is alert and oriented x 3.  SKIN: No obvious rash, lesion, or ulcer.   ORDERS/RESULTS REVIEWED:   CBC  Recent Labs Lab 11/15/14 2110  WBC 8.1  HGB 13.1  HCT 39.9  PLT 328  MCV 85.1  MCH 27.8  MCHC 32.7  RDW 15.9*  LYMPHSABS 1.4  MONOABS 0.7  EOSABS 0.3  BASOSABS 0.0   ------------------------------------------------------------------------------------------------------------------  Chemistries   Recent Labs Lab 11/15/14 2110  NA 142  K 3.2*  CL 109  CO2 26  GLUCOSE 94  BUN 6  CREATININE 0.67  CALCIUM 8.7*   ------------------------------------------------------------------------------------------------------------------ estimated creatinine clearance is 72.5 mL/min (by C-G formula based on Cr of 0.67). ------------------------------------------------------------------------------------------------------------------ No results for input(s): TSH, T4TOTAL, T3FREE, THYROIDAB in the last 72 hours.  Invalid input(s): FREET3  Cardiac Enzymes  Recent Labs Lab 11/15/14 2110  TROPONINI <0.03   ------------------------------------------------------------------------------------------------------------------ Invalid input(s): POCBNP ---------------------------------------------------------------------------------------------------------------  RADIOLOGY: Dg Chest 2 View  11/15/2014  CLINICAL DATA:  Initial valuation for one-day history of mid is chest pain, shortness of breath, cough. EXAM: CHEST  2 VIEW COMPARISON:  Prior study from 03/02/2014. FINDINGS: Cardiomegaly present. Atheromatous plaque within the aortic arch. Mediastinal silhouette otherwise unremarkable. There is parenchymal infiltrate within the left upper lobe, concerning for pneumonia. No other  focal airspace disease. Right lung is grossly clear. No pulmonary edema or pleural effusion. No acute osseous abnormality. IMPRESSION: 1. Parenchymal left upper lobe infiltrate, concerning for pneumonia. 2.  Cardiomegaly without pulmonary edema. Electronically Signed   By: Jeannine Boga M.D.   On: 11/15/2014 22:40   Ct Angio Chest Pe W/cm &/or Wo Cm  11/16/2014  CLINICAL DATA:  Chest pain radiating to the left arm.  Headache. EXAM: CT ANGIOGRAPHY CHEST WITH CONTRAST TECHNIQUE: Multidetector CT imaging of the chest was performed using the standard protocol during bolus administration of intravenous contrast. Multiplanar CT image reconstructions and MIPs were obtained to evaluate the vascular anatomy. CONTRAST:  147m OMNIPAQUE IOHEXOL 350 MG/ML SOLN COMPARISON:  None. FINDINGS: There is adequate opacification of the pulmonary arteries. There is no pulmonary embolus. The main pulmonary artery, right main pulmonary artery and left main pulmonary arteries are normal in size. The heart size is normal. There is no pericardial effusion. There is left upper lobe consolidation. There is patchy left lower lobe airspace disease. There is small nodular airspace disease in the right lower lobe. There is a small cavitary right lower lobe nodule measuring 10 mm. There is no pneumothorax. There is subcarinal lymphadenopathy measuring 2.4 cm in short axis. There is an enlarged right hilar lymph node measuring 13 mm in short axis. There is an enlarged left hilar lymph node measuring 14 mm in short axis. There is no lytic or blastic osseous lesion. The visualized portions of the upper abdomen are unremarkable. Review of the MIP images confirms the above findings. IMPRESSION: 1. Left upper lobe and to a lesser degree left lower lobe pneumonia. Followup PA and lateral chest X-ray is recommended in 3-4 weeks following trial of antibiotic therapy to ensure resolution and exclude underlying malignancy. 2. Small area of nodular  airspace disease in the right lower lobe which may reflect atelectasis versus infection. 3. Small 10 mm cavitary nodule in the right lower lobe may be secondary to infection 4. No evidence of pulmonary embolus. Electronically Signed   By: HKathreen Devoid  On: 11/16/2014 02:00    EKG:  Orders placed or performed in visit on 03/02/14  . EKG 12-Lead  . EKG 12-Lead    ASSESSMENT AND PLAN:  Active Problems:   Pneumonia   COPD (chronic obstructive pulmonary disease) (HCC)   GERD (gastroesophageal reflux disease)   Peripheral neuropathy ([facility]   Peripheral vascular disease ([facility] 1. Acute respiratory failure with hypoxia due to pneumonia, continue oxygen therapy, weaning patient to room air as tolerated 2. Left upper lobe and left lower lobe bacterial pneumonia of unclear but serologic agent at present, continue broad-spectrum antibiotic therapy with Zithromax and Azactam and vancomycin. Sputum cultures. Adjust antibodies. Depending on culture results 3. Sepsis due to bacterial pneumonia, patient is tachypneic as well as tachycardic, blood cultures are pending as well as sputum cultures 4. Hypokalemia supplementing IV 5. Hemoptysis, getting pulmonary consultation, possibly pneumonia related, QuantiFERON test is done, results pending   Management plans discussed with the patient, family and they are in agreement.   DRUG ALLERGIES:  Allergies  Allergen Reactions  . Penicillins Rash    CODE STATUS:     Code Status Orders        Start     Ordered   11/16/14 0549  Full code   Continuous     11/16/14 0548    Advance Directive Documentation        Most Recent Value   Type of Advance Directive  Healthcare Power of Attorney   Pre-existing out of facility DNR order (yellow form or pink MOST form)     "MOST" Form in Place?  TOTAL TIME TAKING CARE OF THIS PATIENT: 40 minutes.    Theodoro Grist M.D on 11/16/2014 at 11:49 AM  Between 7am to 6pm - Pager - [phone]  After 6pm  go to [email or web address] - password [email or web address] [facility]  Office  [phone]  CC: Primary care physician; [place]

## 2014-11-16 NOTE — ED Notes (Signed)
Assisted pt with bedpan.  New linens and pad placed on bed.

## 2014-11-16 NOTE — H&P (Addendum)
PCP:   [place]   Chief Complaint:  Pneumonia   HPI: This is a 64 year old female was diagnosed with pneumonia approximately 2 weeks ago. She has shortness of breath and chills but no fevers. She has been wheezing, she has a history of COPD. She has been on antibiotics for last 5 days. She's on aspirin and Plavix and has been having hemoptysis but yesterday this became significantly worse. She decided to come to the ER. She has never had the flu or pneumonia vaccine as she states they make her ill. She has never been to a nursing home and has no recent travel. She denies weight loss but states he has loss of appetite. His altered mentation. In the ER she is hypoxic with oxygen sats in the 80s.   Review of Systems:  The patient denies anorexia, fever, weight loss,, vision loss, decreased hearing, hoarseness, chest pain, syncope, SOB, peripheral edema, balance deficits, hemoptysis, abdominal pain, melena, hematochezia, severe indigestion/heartburn, hematuria, incontinence, genital sores, muscle weakness, suspicious skin lesions, transient blindness, difficulty walking, depression, unusual weight change, abnormal bleeding, enlarged lymph nodes, angioedema, and breast masses.  Past Medical History: Past Medical History  Diagnosis Date  . Hypertension    PSHx: Left AKA  Medications: Prior to Admission medications   Medication Sig Start Date End Date Taking? Authorizing Provider  acetaminophen (TYLENOL) 325 MG tablet Take 1 tablet by mouth every 4 (four) hours as needed.   Yes Historical Provider, MD  albuterol (PROAIR HFA) 108 (90 BASE) MCG/ACT inhaler Inhale into the lungs.   Yes Historical Provider, MD  albuterol (PROVENTIL) (2.5 MG/3ML) 0.083% nebulizer solution Inhale into the lungs.   Yes Historical Provider, MD  amLODipine-atorvastatin (CADUET) 5-20 MG tablet Take 1 tablet by mouth daily.    Yes Historical Provider, MD  aspirin EC 81 MG tablet Take 81 mg by mouth daily.      Historical Provider, MD  celecoxib (CELEBREX) 200 MG capsule Take 200 mg by mouth daily.     Historical Provider, MD  citalopram (CELEXA) 20 MG tablet Take 20 mg by mouth daily.     Historical Provider, MD  clopidogrel (PLAVIX) 75 MG tablet Take 75 mg by mouth daily.     Historical Provider, MD  LORazepam (ATIVAN) 1 MG tablet Take by mouth.    Historical Provider, MD  oxybutynin (DITROPAN) 5 MG tablet Take 5 mg by mouth.     Historical Provider, MD  oxybutynin (DITROPAN-XL) 10 MG 24 hr tablet Take 10 mg by mouth at bedtime.     Historical Provider, MD  oxyCODONE-acetaminophen (PERCOCET/ROXICET) 5-325 MG tablet Take 1 tablet by mouth every 6 (six) hours as needed.     Historical Provider, MD  pregabalin (LYRICA) 100 MG capsule Take 100 mg by mouth daily.     Historical Provider, MD  traMADol (ULTRAM-ER) 100 MG 24 hr tablet Take 100 mg by mouth daily as needed.     Historical Provider, MD  traZODone (DESYREL) 150 MG tablet Take 150 mg by mouth at bedtime as needed.     Historical Provider, MD  Vitamin D, Ergocalciferol, (DRISDOL) 50000 UNITS CAPS capsule Take 50,000 Units by mouth every 7 (seven) days.     Historical Provider, MD    Allergies:   Allergies  Allergen Reactions  . Penicillins Rash    Social History:  reports that she has quit smoking. She does not have any smokeless tobacco history on file. Her alcohol and drug histories are not on file.uses  wheelchair, Live at home with 62 y/o mother  Family History: Lung cancer  Physical Exam: Filed Vitals:   11/16/14 0152 11/16/14 0201 11/16/14 0231 11/16/14 0300  BP: 139/65 172/96 149/119 148/99  Pulse: 97 97  97  Temp:      TempSrc:      Resp: '27 18 22 20  '$ Weight:      SpO2: 93% 95%  90%    General:  Alert and oriented times three, well developed and nourished, no acute distress Eyes: PERRLA, pink conjunctiva, no scleral icterus ENT: Moist oral mucosa, neck supple, no thyromegaly Lungs: clear to ascultation, positive  wheeze, no crackles, no use of accessory muscles Cardiovascular: regular rate and rhythm, no regurgitation, no gallops, no murmurs. No carotid bruits, no JVD Abdomen: soft, positive BS, non-tender, non-distended, no organomegaly, not an acute abdomen GU: not examined Neuro: CN II - XII grossly intact, sensation intact Musculoskeletal: strength 5/5 all extremities, Left AKA, no clubbing, cyanosis or edema Skin: no rash, no subcutaneous crepitation, no decubitus Psych: appropriate patient   Labs on Admission:   Recent Labs  11/15/14 2110  NA 142  K 3.2*  CL 109  CO2 26  GLUCOSE 94  BUN 6  CREATININE 0.67  CALCIUM 8.7*   No results for input(s): AST, ALT, ALKPHOS, BILITOT, PROT, ALBUMIN in the last 72 hours. No results for input(s): LIPASE, AMYLASE in the last 72 hours.  Recent Labs  11/15/14 2110  WBC 8.1  NEUTROABS 5.6  HGB 13.1  HCT 39.9  MCV 85.1  PLT 328    Recent Labs  11/15/14 2110  TROPONINI <0.03   Invalid input(s): POCBNP No results for input(s): DDIMER in the last 72 hours. No results for input(s): HGBA1C in the last 72 hours. No results for input(s): CHOL, HDL, LDLCALC, TRIG, CHOLHDL, LDLDIRECT in the last 72 hours. No results for input(s): TSH, T4TOTAL, T3FREE, THYROIDAB in the last 72 hours.  Invalid input(s): FREET3 No results for input(s): VITAMINB12, FOLATE, FERRITIN, TIBC, IRON, RETICCTPCT in the last 72 hours.  Micro Results: No results found for this or any previous visit (from the past 240 hour(s)).   Radiological Exams on Admission: Dg Chest 2 View  11/15/2014  CLINICAL DATA:  Initial valuation for one-day history of mid is chest pain, shortness of breath, cough. EXAM: CHEST  2 VIEW COMPARISON:  Prior study from 03/02/2014. FINDINGS: Cardiomegaly present. Atheromatous plaque within the aortic arch. Mediastinal silhouette otherwise unremarkable. There is parenchymal infiltrate within the left upper lobe, concerning for pneumonia. No other  focal airspace disease. Right lung is grossly clear. No pulmonary edema or pleural effusion. No acute osseous abnormality. IMPRESSION: 1. Parenchymal left upper lobe infiltrate, concerning for pneumonia. 2. Cardiomegaly without pulmonary edema. Electronically Signed   By: Jeannine Boga M.D.   On: 11/15/2014 22:40   Ct Angio Chest Pe W/cm &/or Wo Cm  11/16/2014  CLINICAL DATA:  Chest pain radiating to the left arm.  Headache. EXAM: CT ANGIOGRAPHY CHEST WITH CONTRAST TECHNIQUE: Multidetector CT imaging of the chest was performed using the standard protocol during bolus administration of intravenous contrast. Multiplanar CT image reconstructions and MIPs were obtained to evaluate the vascular anatomy. CONTRAST:  138m OMNIPAQUE IOHEXOL 350 MG/ML SOLN COMPARISON:  None. FINDINGS: There is adequate opacification of the pulmonary arteries. There is no pulmonary embolus. The main pulmonary artery, right main pulmonary artery and left main pulmonary arteries are normal in size. The heart size is normal. There is no pericardial effusion. There is  left upper lobe consolidation. There is patchy left lower lobe airspace disease. There is small nodular airspace disease in the right lower lobe. There is a small cavitary right lower lobe nodule measuring 10 mm. There is no pneumothorax. There is subcarinal lymphadenopathy measuring 2.4 cm in short axis. There is an enlarged right hilar lymph node measuring 13 mm in short axis. There is an enlarged left hilar lymph node measuring 14 mm in short axis. There is no lytic or blastic osseous lesion. The visualized portions of the upper abdomen are unremarkable. Review of the MIP images confirms the above findings. IMPRESSION: 1. Left upper lobe and to a lesser degree left lower lobe pneumonia. Followup PA and lateral chest X-ray is recommended in 3-4 weeks following trial of antibiotic therapy to ensure resolution and exclude underlying malignancy. 2. Small area of nodular  airspace disease in the right lower lobe which may reflect atelectasis versus infection. 3. Small 10 mm cavitary nodule in the right lower lobe may be secondary to infection 4. No evidence of pulmonary embolus. Electronically Signed   By: Kathreen Devoid   On: 11/16/2014 02:00    Assessment/Plan Present on Admission:  . Pneumonia likely gram negative organism, failed outpatient therapy,  Hemoptysis -Admit to MedSurg anemia -Blood cultures and urine cultures ordered -Aggressive antibiotics azithromycin, aztreonam and vancomycin -Pulmonary consulted -PPD and QuantiFERON gold ordered -DC aspirin and Plavix -Low likelihood of TB but will place patient in isolation onto the PPD read and resulted COPD exacerbation -Due to nebs, oxygen -May need Solu-Medrol but not ordered onto PPD resulted  Hypokalemia -Replete by mouth H/o CVA H/o blood clot LLE AKA GERD -stable, home medications resumed -patient request a shrinker for his amputated extremity as it helps with phantom pain. Attempting to get one in the ER    Orrin Yurkovich 11/16/2014, 3:46 AM

## 2014-11-16 NOTE — Progress Notes (Signed)
ANTIBIOTIC CONSULT NOTE - INITIAL  Pharmacy Consult for vancomycin Indication: pneumonia  Allergies  Allergen Reactions  . Penicillins Rash    Patient Measurements: Weight: 162 lb (73.483 kg) Adjusted Body Weight: 60.8 kg  Vital Signs: Temp: 98.6 F (37 C) (10/16 0424) Temp Source: Oral (10/16 0424) BP: 151/73 mmHg (10/16 0424) Pulse Rate: 95 (10/16 0424) Intake/Output from previous day:   Intake/Output from this shift:    Labs:  Recent Labs  11/15/14 2110  WBC 8.1  HGB 13.1  PLT 328  CREATININE 0.67   CrCl cannot be calculated (Unknown ideal weight.). No results for input(s): VANCOTROUGH, VANCOPEAK, VANCORANDOM, GENTTROUGH, GENTPEAK, GENTRANDOM, TOBRATROUGH, TOBRAPEAK, TOBRARND, AMIKACINPEAK, AMIKACINTROU, AMIKACIN in the last 72 hours.   Microbiology: No results found for this or any previous visit (from the past 720 hour(s)).  Medical History: Past Medical History  Diagnosis Date  . Hypertension     Medications:  Infusions:   Assessment: 64 yof cc pneumonia x 2 weeks, SOB and chills no fever. Has been on antibiotics for past 5 days. In ER hypoxic sats 80s. No history of PPSV/PCV or influenza vaccine due to reaction, prescriber ordered PPD and Quantiferon.   Vd 42.6 L, Ke 0.072 hr-1, T1/2 9.6 hr, predicted trough 17 mcg/mL.   Goal of Therapy:  Vancomycin trough level 15-20 mcg/ml  Plan:  Expected duration 5 days with resolution of temperature and/or normalization of WBC. Vancomycin 1 gm IV Q12H, will follow labs/levels and adjust dose as needed to maintain trough 15 to 20 mcg/mL.  Laural Benes, Pharm.D. Clinical Pharmacist 11/16/2014,4:57 AM

## 2014-11-16 NOTE — Plan of Care (Signed)
Problem: Discharge Progression Outcomes Goal: Other Discharge Outcomes/Goals Outcome: Progressing Patient c/o nausea x1 relieved well with prn Phenergan 12.5 mg oral  Patient c/o heartburn /indigestion relieved well with prn Maalox Weaned patient off o2 and sats with 93% on room air  VSS Patient is tolerating diet well except for having nausea once at lunch with some emesis  Patient continues on Airborne precautions

## 2014-11-16 NOTE — ED Notes (Signed)
Assisted pt with bedpan

## 2014-11-16 NOTE — Progress Notes (Signed)
Called Dr Ether Griffins told he patient had gram positive cocci on blood culture, she acknowledged no new orders

## 2014-11-16 NOTE — Progress Notes (Signed)
Put patients POA Emily Kidd  in contact with Dr Ether Griffins

## 2014-11-16 NOTE — ED Notes (Signed)
MD at bedside. 

## 2014-11-16 NOTE — ED Notes (Signed)
Attempted IV insertion for PE study multiple times by 3 nurses.  Attempts unsuccessful.  Dr. Beather Arbour notified.

## 2014-11-16 NOTE — Consult Note (Signed)
[facility] Consultation      Assessment and Plan:  Pneumonia with hemoptysis.  -Review of CT chest shows a dense LUL pneumonia. There is also hilar and subcarinal lymphadenopathy. The patients hemoptysis is likely due to pneumonia while on plavix and aspirin.  -Await TB quantiferon test results, PPD. I have ordered induced sputums, as well as influenza and RSV swabs and mycoplasma igM.  -Staph in blood; await identification, if negative can DC vanco and continue coverage for pneumonia.  -Pt will need outpatient follow up. If chest imaging not improved, may require further investigation as the adenopathy is somewhat concerning.   Acute hypoxic respiratory failure.  -This has resolved, currently 93% at RA.  -Can potentially be discharged soon and follow up outpatient if quantiferon test negative.   Left lung atelectasis with mediastinal shift. -Due to pneumonia.    Date: 11/16/2014  MRN# [id number] Emily Kidd [birth year]  Referring Physician:   AMICA Kidd is a 64 y.o. old female seen in consultation for chief complaint of:    Chief Complaint  Patient presents with  . Chest Pain    HPI:   The patient is unable to provide history at this time as she is somewhat lethargic, therefore all history was obtained from the chart and staff.   Per H&P: This is a 64 year old female was diagnosed with pneumonia approximately 2 weeks ago. She has shortness of breath and chills but no fevers. She has been wheezing, she has a history of COPD. She has been on antibiotics for last 5 days. She's on aspirin and Plavix and has been having hemoptysis but yesterday this became significantly worse. She decided to come to the ER. She has never had the flu or pneumonia vaccine as she states they make her ill. She has never been to a nursing home and has no recent travel. She denies weight loss but states he has loss of appetite. His altered mentation. In the ER she is hypoxic with oxygen  sats in the 80s.  CT chest 11/16/14; Dense LUL pneumonia. Subcarinal, right hilar, paratracheal lymphadenopathy.   No flowsheet data found.  Pulmonary Functions Testing Results:  No results found for: FEV1, FVC, FEV1FVC, TLC, DLCO   PMHX:   Past Medical History  Diagnosis Date  . Hypertension    Surgical Hx:  No past surgical history on file. Family Hx:  No family history on file. Social Hx:   Social History  Substance Use Topics  . Smoking status: Former Research scientist (life sciences)  . Smokeless tobacco: None  . Alcohol Use: None   Medication:   No current outpatient prescriptions on file.    Allergies:  Penicillins  Review of Systems: Gen:  Denies  fever, sweats, chills HEENT: Denies blurred vision, double vision.  Cvc:  No dizziness, chest pain. Resp:   Denies cough or sputum porduction,  Gi: Denies swallowing difficulty, stomach pain. Gu:  Denies bladder incontinence, burning urine Ext:   No Joint pain, stiffness. Skin: No skin rash,  hives Endoc:  No polyuria, polydipsia. Psych: No depression, insomnia. Other:  All other systems were reviewed with the patient and were negative other that what is mentioned in the HPI.   Physical Examination:   VS: BP 115/87 mmHg  Pulse 101  Temp(Src) 98.8 F (37.1 C) (Oral)  Resp 18  Ht '5\' 6"'$  (1.676 m)  Wt 160 lb (72.576 kg)  BMI 25.84 kg/m2  SpO2 93%  General Appearance: No distress  Neuro:without focal findings,  speech  normal,  HEENT: PERRLA, EOM intact.   Pulmonary: Left lung crackles.  No wheezing.  CardiovascularNormal S1,S2.  No m/r/g.   Abdomen: Benign, Soft, non-tender. Renal:  No costovertebral tenderness  GU:  No performed at this time. Endoc: No evident thyromegaly, no signs of acromegaly. Skin:   warm, no rashes, no ecchymosis  Extremities: normal, no cyanosis, clubbing.  Other findings:    LABORATORY PANEL:   CBC  Recent Labs Lab 11/15/14 2110  WBC 8.1  HGB 13.1  HCT 39.9  PLT 328    ------------------------------------------------------------------------------------------------------------------  Chemistries   Recent Labs Lab 11/15/14 2110  NA 142  K 3.2*  CL 109  CO2 26  GLUCOSE 94  BUN 6  CREATININE 0.67  CALCIUM 8.7*   ------------------------------------------------------------------------------------------------------------------  Cardiac Enzymes  Recent Labs Lab 11/15/14 2110  TROPONINI <0.03   ------------------------------------------------------------  RADIOLOGY:  Dg Chest 2 View  11/15/2014  CLINICAL DATA:  Initial valuation for one-day history of mid is chest pain, shortness of breath, cough. EXAM: CHEST  2 VIEW COMPARISON:  Prior study from 03/02/2014. FINDINGS: Cardiomegaly present. Atheromatous plaque within the aortic arch. Mediastinal silhouette otherwise unremarkable. There is parenchymal infiltrate within the left upper lobe, concerning for pneumonia. No other focal airspace disease. Right lung is grossly clear. No pulmonary edema or pleural effusion. No acute osseous abnormality. IMPRESSION: 1. Parenchymal left upper lobe infiltrate, concerning for pneumonia. 2. Cardiomegaly without pulmonary edema. Electronically Signed   By: Jeannine Boga M.D.   On: 11/15/2014 22:40   Ct Angio Chest Pe W/cm &/or Wo Cm  11/16/2014  CLINICAL DATA:  Chest pain radiating to the left arm.  Headache. EXAM: CT ANGIOGRAPHY CHEST WITH CONTRAST TECHNIQUE: Multidetector CT imaging of the chest was performed using the standard protocol during bolus administration of intravenous contrast. Multiplanar CT image reconstructions and MIPs were obtained to evaluate the vascular anatomy. CONTRAST:  162m OMNIPAQUE IOHEXOL 350 MG/ML SOLN COMPARISON:  None. FINDINGS: There is adequate opacification of the pulmonary arteries. There is no pulmonary embolus. The main pulmonary artery, right main pulmonary artery and left main pulmonary arteries are normal in size. The  heart size is normal. There is no pericardial effusion. There is left upper lobe consolidation. There is patchy left lower lobe airspace disease. There is small nodular airspace disease in the right lower lobe. There is a small cavitary right lower lobe nodule measuring 10 mm. There is no pneumothorax. There is subcarinal lymphadenopathy measuring 2.4 cm in short axis. There is an enlarged right hilar lymph node measuring 13 mm in short axis. There is an enlarged left hilar lymph node measuring 14 mm in short axis. There is no lytic or blastic osseous lesion. The visualized portions of the upper abdomen are unremarkable. Review of the MIP images confirms the above findings. IMPRESSION: 1. Left upper lobe and to a lesser degree left lower lobe pneumonia. Followup PA and lateral chest X-ray is recommended in 3-4 weeks following trial of antibiotic therapy to ensure resolution and exclude underlying malignancy. 2. Small area of nodular airspace disease in the right lower lobe which may reflect atelectasis versus infection. 3. Small 10 mm cavitary nodule in the right lower lobe may be secondary to infection 4. No evidence of pulmonary embolus. Electronically Signed   By: HKathreen Devoid  On: 11/16/2014 02:00       Thank  you for the consultation and for allowing [facility] to assist in the care of your patient. Our recommendations are  noted above.  Please contact us if we can be of further service.   Marda Stalker, MD.  Board Certified in Internal Medicine, Pulmonary Medicine, [place], and Sleep Medicine.  [facility] Office Number: [phone]  Patricia Pesa, M.D.  Vilinda Boehringer, M.D.  Merton Border, M.D

## 2014-11-17 ENCOUNTER — Inpatient Hospital Stay (HOSPITAL_COMMUNITY)
Admit: 2014-11-17 | Discharge: 2014-11-17 | Disposition: A | Discharge status: Home / Self Care | Payer: Medicare (Managed Care) | Attending: Internal Medicine | Admitting: Internal Medicine

## 2014-11-17 DIAGNOSIS — J189 Pneumonia, unspecified organism: Secondary | ICD-10-CM

## 2014-11-17 DIAGNOSIS — I361 Nonrheumatic tricuspid (valve) insufficiency: Secondary | ICD-10-CM

## 2014-11-17 LAB — INFLUENZA PANEL BY PCR (TYPE A & B)
H1N1FLUPCR: NOT DETECTED
INFLAPCR: NEGATIVE
Influenza B By PCR: NEGATIVE

## 2014-11-17 LAB — VANCOMYCIN, TROUGH: Vancomycin Tr: 13 ug/mL (ref 10–20)

## 2014-11-17 LAB — CREATININE, SERUM
Creatinine, Ser: 0.62 mg/dL (ref 0.44–1.00)
GFR calc non Af Amer: >60 mL/min (ref >60)

## 2014-11-17 LAB — MYCOPLASMA PNEUMONIAE ANTIBODY, IGM

## 2014-11-17 NOTE — Plan of Care (Addendum)
Problem: Discharge Progression Outcomes Goal: Other Discharge Outcomes/Goals Outcome: Progressing Plan of care progress to goal: Quantiferon test drawn today. Pt on room air. Remains on airborne precautions.  Pt not coughing up any sputum. ECHO done today. C/o headache, tylenol given PRN. Pt resting quietly after. Tolerating diet. Calls out for needs. IV ABX infusing. Afebrile.  Around end of shift, patient felt anxious and shakey, lorazepam given.

## 2014-11-17 NOTE — Plan of Care (Signed)
Problem: Discharge Progression Outcomes Goal: Other Discharge Outcomes/Goals Outcome: Progressing Pt alert and oriented. Pt with c/o right leg cramp. Pt requested tylenol. Pain improved. VSS. IV abx continue. Airborne isolation continues to r/o TB. Pt refused nasal swab to test for flu.

## 2014-11-17 NOTE — Progress Notes (Signed)
*  PRELIMINARY RESULTS* Echocardiogram 2D Echocardiogram has been performed.  Emily Kidd 11/17/2014, 10:29 AM

## 2014-11-17 NOTE — Consult Note (Signed)
[facility] Consultation      Assessment and Plan:  Pneumonia with hemoptysis.  -Review of CT chest shows a dense LUL pneumonia. There is also hilar and subcarinal lymphadenopathy. The patients hemoptysis is likely due to pneumonia while on plavix and aspirin.  -Await TB quantiferon test results, PPD.  -Staph in blood; await identification, if negative can DC vanco and continue coverage for pneumonia.  -Pt will need outpatient follow up with follow up CT chest in 6-8 weeks  Acute hypoxic respiratory failure.  -This has resolved  Left lung atelectasis with mediastinal shift. -Due to pneumonia.    Date: 11/17/2014  MRN# [id number] Emily Kidd [birth year]  Referring Physician:   MAKAYLIE Kidd is a 64 y.o. old female seen in consultation for chief complaint of:    Chief Complaint  Patient presents with  . Chest Pain    HPI:   Alert and awake, no resp distress, hemoptysis resolved, breathing much improved, off oxygen  blood cultures POS for GPC's await sens  PMHX:   Past Medical History  Diagnosis Date  . Hypertension    Surgical Hx:  History reviewed. No pertinent past surgical history. Family Hx:  History reviewed. No pertinent family history. Social Hx:   Social History  Substance Use Topics  . Smoking status: Former Research scientist (life sciences)  . Smokeless tobacco: None  . Alcohol Use: No   Medication:   No current outpatient prescriptions on file.    Allergies:  Penicillins  Review of Systems: Gen:  Denies  fever, sweats, chills HEENT: Denies blurred vision, double vision.  Cvc:  No dizziness, chest pain. Resp:   Denies cough or sputum porduction,  Gi: Denies swallowing difficulty, stomach pain. Gu:  Denies bladder incontinence, burning urine Ext:   No Joint pain, stiffness. Skin: No skin rash,  hives Endoc:  No polyuria, polydipsia. Psych: No depression, insomnia. Other:  All other systems were reviewed with the patient and were negative other that  what is mentioned in the HPI.   Physical Examination:   VS: BP 122/99 mmHg  Pulse 110  Temp(Src) 99 F (37.2 C) (Oral)  Resp 18  Ht '5\' 6"'$  (1.676 m)  Wt 160 lb (72.576 kg)  BMI 25.84 kg/m2  SpO2 100%  General Appearance: No distress  Neuro:without focal findings,  speech normal,  HEENT: PERRLA, EOM intact.   Pulmonary: Left lung crackles.  No wheezing.  CardiovascularNormal S1,S2.  No m/r/g.   Abdomen: Benign, Soft, non-tender. Renal:  No costovertebral tenderness  GU:  No performed at this time. Endoc: No evident thyromegaly, no signs of acromegaly. Skin:   warm, no rashes, no ecchymosis  Extremities: normal, no cyanosis, clubbing.    LABORATORY PANEL:   CBC  Recent Labs Lab 11/15/14 2110  WBC 8.1  HGB 13.1  HCT 39.9  PLT 328   ------------------------------------------------------------------------------------------------------------------  Chemistries   Recent Labs Lab 11/15/14 2110 11/17/14 0435  NA 142  --   K 3.2*  --   CL 109  --   CO2 26  --   GLUCOSE 94  --   BUN 6  --   CREATININE 0.67 0.62  CALCIUM 8.7*  --    ------------------------------------------------------------------------------------------------------------------  Cardiac Enzymes  Recent Labs Lab 11/15/14 2110  TROPONINI <0.03   ------------------------------------------------------------  RADIOLOGY:  Dg Chest 2 View  11/15/2014  CLINICAL DATA:  Initial valuation for one-day history of mid is chest pain, shortness of breath, cough. EXAM: CHEST  2 VIEW COMPARISON:  Prior study  from 03/02/2014. FINDINGS: Cardiomegaly present. Atheromatous plaque within the aortic arch. Mediastinal silhouette otherwise unremarkable. There is parenchymal infiltrate within the left upper lobe, concerning for pneumonia. No other focal airspace disease. Right lung is grossly clear. No pulmonary edema or pleural effusion. No acute osseous abnormality. IMPRESSION: 1. Parenchymal left upper lobe  infiltrate, concerning for pneumonia. 2. Cardiomegaly without pulmonary edema. Electronically Signed   By: Jeannine Boga M.D.   On: 11/15/2014 22:40   Ct Angio Chest Pe W/cm &/or Wo Cm  11/16/2014  CLINICAL DATA:  Chest pain radiating to the left arm.  Headache. EXAM: CT ANGIOGRAPHY CHEST WITH CONTRAST TECHNIQUE: Multidetector CT imaging of the chest was performed using the standard protocol during bolus administration of intravenous contrast. Multiplanar CT image reconstructions and MIPs were obtained to evaluate the vascular anatomy. CONTRAST:  142m OMNIPAQUE IOHEXOL 350 MG/ML SOLN COMPARISON:  None. FINDINGS: There is adequate opacification of the pulmonary arteries. There is no pulmonary embolus. The main pulmonary artery, right main pulmonary artery and left main pulmonary arteries are normal in size. The heart size is normal. There is no pericardial effusion. There is left upper lobe consolidation. There is patchy left lower lobe airspace disease. There is small nodular airspace disease in the right lower lobe. There is a small cavitary right lower lobe nodule measuring 10 mm. There is no pneumothorax. There is subcarinal lymphadenopathy measuring 2.4 cm in short axis. There is an enlarged right hilar lymph node measuring 13 mm in short axis. There is an enlarged left hilar lymph node measuring 14 mm in short axis. There is no lytic or blastic osseous lesion. The visualized portions of the upper abdomen are unremarkable. Review of the MIP images confirms the above findings. IMPRESSION: 1. Left upper lobe and to a lesser degree left lower lobe pneumonia. Followup PA and lateral chest X-ray is recommended in 3-4 weeks following trial of antibiotic therapy to ensure resolution and exclude underlying malignancy. 2. Small area of nodular airspace disease in the right lower lobe which may reflect atelectasis versus infection. 3. Small 10 mm cavitary nodule in the right lower lobe may be secondary to  infection 4. No evidence of pulmonary embolus. Electronically Signed   By: HKathreen Devoid  On: 11/16/2014 02:00     I have personally obtained a history, examined the patient, evaluated Pertinent laboratory and [employer]/imaging results, and  formulated the assessment and plan   The Patient requires high complexity decision making for assessment and support, frequent evaluation and titration of therapies. Patient satisfied with Plan of action and management.   KCorrin Parker M.D.  [facility] HecklerPulmonary & Critical Care Medicine  Medical Director IPlymouthDirector ANortheast [facility] LLCCardio-Pulmonary Department

## 2014-11-17 NOTE — Progress Notes (Signed)
[facility] NAME: Tiarrah Saville    MR#:  [id number]  DATE OF BIRTH:  04/20/1950  SUBJECTIVE:  CHIEF COMPLAINT:   Chief Complaint  Patient presents with  . Chest Pain   patient is 64 year old female with history of diagnosis of pneumonia approximately 2 weeks ago who presents to the hospital for shortness of breath, chills and wheezing. She also noted some hemoptysis. In emergency room, she was noted to be hypoxic with O2 sats in 80s. Patient's chest x-ray revealed a left upper lobe pneumonia , CT scan of the chest revealed left upper lobe as well as left lower lobe pneumonia as well as 10 mm cavitary nodule in the right lower lobe . She was admitted for evaluation and treatment. PPD was placed, as well as QuantiFERON test was taken for TB, patient is placed in respiratory isolation. She feels much improved today. No hemoptysis. No shortness of breath. PPD is negative at this time. QuantiFERON gold results will be available in 3 days after test is taken. She complains of less cough or sputum production.   Review of Systems  Respiratory: Positive for cough, hemoptysis, sputum production, shortness of breath and wheezing.     VITAL SIGNS: Blood pressure 122/99, pulse 110, temperature 99 F (37.2 C), temperature source Oral, resp. rate 18, height '5\' 6"'$  (1.676 m), weight 72.576 kg (160 lb), SpO2 100 %.  PHYSICAL EXAMINATION:   GENERAL:  64 y.o.-year-old patient lying in the bed with no acute distress. Somnolent but comfortable EYES: Pupils equal, round, reactive to light and accommodation. No scleral icterus. Extraocular muscles intact.  HEENT: Head atraumatic, normocephalic. Oropharynx and nasopharynx clear.  NECK:  Supple, no jugular venous distention. No thyroid enlargement, no tenderness.  LUNGS: Normal breath sounds bilaterally, no wheezing, few rales and rhonchi , no crepitation. No use of accessory muscles of respiration.   CARDIOVASCULAR: S1, S2 normal. No murmurs, rubs, or gallops.  ABDOMEN: Soft, nontender, nondistended. Bowel sounds present. No organomegaly or mass.  EXTREMITIES: No pedal edema, cyanosis, or clubbing.  NEUROLOGIC: Cranial nerves II through XII are intact. Muscle strength 5/5 in all extremities. Sensation intact. Gait not checked.  PSYCHIATRIC: The patient is alert and oriented x 3.  SKIN: No obvious rash, lesion, or ulcer.   ORDERS/RESULTS REVIEWED:   CBC  Recent Labs Lab 11/15/14 2110  WBC 8.1  HGB 13.1  HCT 39.9  PLT 328  MCV 85.1  MCH 27.8  MCHC 32.7  RDW 15.9*  LYMPHSABS 1.4  MONOABS 0.7  EOSABS 0.3  BASOSABS 0.0   ------------------------------------------------------------------------------------------------------------------  Chemistries   Recent Labs Lab 11/15/14 2110 11/17/14 0435  NA 142  --   K 3.2*  --   CL 109  --   CO2 26  --   GLUCOSE 94  --   BUN 6  --   CREATININE 0.67 0.62  CALCIUM 8.7*  --    ------------------------------------------------------------------------------------------------------------------ estimated creatinine clearance is 72.5 mL/min (by C-G formula based on Cr of 0.62). ------------------------------------------------------------------------------------------------------------------ No results for input(s): TSH, T4TOTAL, T3FREE, THYROIDAB in the last 72 hours.  Invalid input(s): FREET3  Cardiac Enzymes  Recent Labs Lab 11/15/14 2110  TROPONINI <0.03   ------------------------------------------------------------------------------------------------------------------ Invalid input(s): POCBNP ---------------------------------------------------------------------------------------------------------------  RADIOLOGY: Dg Chest 2 View  11/15/2014  CLINICAL DATA:  Initial valuation for one-day history of mid is chest pain, shortness of breath, cough. EXAM: CHEST  2 VIEW COMPARISON:  Prior study from 03/02/2014. FINDINGS:  Cardiomegaly present. Atheromatous  plaque within the aortic arch. Mediastinal silhouette otherwise unremarkable. There is parenchymal infiltrate within the left upper lobe, concerning for pneumonia. No other focal airspace disease. Right lung is grossly clear. No pulmonary edema or pleural effusion. No acute osseous abnormality. IMPRESSION: 1. Parenchymal left upper lobe infiltrate, concerning for pneumonia. 2. Cardiomegaly without pulmonary edema. Electronically Signed   By: Jeannine Boga M.D.   On: 11/15/2014 22:40   Ct Angio Chest Pe W/cm &/or Wo Cm  11/16/2014  CLINICAL DATA:  Chest pain radiating to the left arm.  Headache. EXAM: CT ANGIOGRAPHY CHEST WITH CONTRAST TECHNIQUE: Multidetector CT imaging of the chest was performed using the standard protocol during bolus administration of intravenous contrast. Multiplanar CT image reconstructions and MIPs were obtained to evaluate the vascular anatomy. CONTRAST:  180m OMNIPAQUE IOHEXOL 350 MG/ML SOLN COMPARISON:  None. FINDINGS: There is adequate opacification of the pulmonary arteries. There is no pulmonary embolus. The main pulmonary artery, right main pulmonary artery and left main pulmonary arteries are normal in size. The heart size is normal. There is no pericardial effusion. There is left upper lobe consolidation. There is patchy left lower lobe airspace disease. There is small nodular airspace disease in the right lower lobe. There is a small cavitary right lower lobe nodule measuring 10 mm. There is no pneumothorax. There is subcarinal lymphadenopathy measuring 2.4 cm in short axis. There is an enlarged right hilar lymph node measuring 13 mm in short axis. There is an enlarged left hilar lymph node measuring 14 mm in short axis. There is no lytic or blastic osseous lesion. The visualized portions of the upper abdomen are unremarkable. Review of the MIP images confirms the above findings. IMPRESSION: 1. Left upper lobe and to a lesser degree left  lower lobe pneumonia. Followup PA and lateral chest X-ray is recommended in 3-4 weeks following trial of antibiotic therapy to ensure resolution and exclude underlying malignancy. 2. Small area of nodular airspace disease in the right lower lobe which may reflect atelectasis versus infection. 3. Small 10 mm cavitary nodule in the right lower lobe may be secondary to infection 4. No evidence of pulmonary embolus. Electronically Signed   By: HKathreen Devoid  On: 11/16/2014 02:00    EKG:  Orders placed or performed in visit on 03/02/14  . EKG 12-Lead  . EKG 12-Lead    ASSESSMENT AND PLAN:  Active Problems:   Pneumonia   COPD (chronic obstructive pulmonary disease) (HCC)   GERD (gastroesophageal reflux disease)   Peripheral neuropathy ([place]   Peripheral vascular disease ([place] 1. Gram-positive cocci sepsis , awaiting identification. Continue vancomycin for now. Repeated blood cultures were taken as well as echocardiogram is ordered  2 Acute respiratory failure with hypoxia due to pneumonia, oxygenation has improved significantly and now patient is on room air and saturating 100% 3. Left upper lobe and left lower lobe bacterial pneumonia of unclear but bacteriologic agent at present, continue broad-spectrum antibiotic therapy with Zithromax and Azactam and vancomycin. Attempting to obtain Sputum cultures. So far unsuccessful.  4. Hypokalemia supplementing IV, check level tomorrow morning 5. Hemoptysis, appreciate pulmonary consultation, pneumonia related, QuantiFERON test is done, results pending   Management plans discussed with the patient, family and they are in agreement.   DRUG ALLERGIES:  Allergies  Allergen Reactions  . Penicillins Rash    CODE STATUS:     Code Status Orders        Start     Ordered   11/16/14 0549  Full  code   Continuous     11/16/14 0548    Advance Directive Documentation        Most Recent Value   Type of Advance Directive  Healthcare Power of Attorney    Pre-existing out of facility DNR order (yellow form or pink MOST form)     "MOST" Form in Place?        TOTAL TIME TAKING CARE OF THIS PATIENT: 40 minutes.    Theodoro Grist M.D on 11/17/2014 at 2:14 PM  Between 7am to 6pm - Pager - [phone]  After 6pm go to [email or web address] - password [email or web address] [facility]  Office  [phone]  CC: Primary care physician; [place]

## 2014-11-17 NOTE — Care Management Important Message (Signed)
Important Message  Patient Details  Name: ZANETA LIGHTCAP MRN: [id number] Date of Birth: 12-Sep-1950   Medicare Important Message Given:  Yes-second notification given    Shelbie Ammons, RN 11/17/2014, 12:04 PM

## 2014-11-17 NOTE — Care Management (Signed)
Admitted to [facility] with the diagnosis of pneumonia. Lives with mother, Racheal Patches, [phone]). Sees Dr. Coralie Common at the [facility] program. A member for the last 6 years. Attends Monday-Friday 8:00am- 2:00pm. Equipment in the home: wheelchair, bedside commode, shower chair, hospital bed. Life Alert present in the home. Decreased appetite lately. No falls.  Shelbie Ammons RN MSN Care Management [phone]

## 2014-11-17 NOTE — Progress Notes (Signed)
ANTIBIOTIC CONSULT NOTE - FOLLOW UP  Pharmacy Consult for Vancomycin Indication: pneumonia  Allergies  Allergen Reactions  . Penicillins Rash    Patient Measurements: Height: '5\' 6"'$  (167.6 cm) Weight: 160 lb (72.576 kg) IBW/kg (Calculated) : 59.3  Vital Signs:   Intake/Output from previous day: 10/16 0701 - 10/17 0700 In: 250 [IV Piggyback:250] Out: 650 [Urine:600; Emesis/NG output:50]  Labs:  Recent Labs  11/15/14 2110 11/17/14 0435  WBC 8.1  --   HGB 13.1  --   PLT 328  --   CREATININE 0.67 0.62   Estimated Creatinine Clearance: 72.5 mL/min (by C-G formula based on Cr of 0.62). No results for input(s): VANCOTROUGH, VANCOPEAK, VANCORANDOM, GENTTROUGH, GENTPEAK, GENTRANDOM, TOBRATROUGH, TOBRAPEAK, TOBRARND, AMIKACINPEAK, AMIKACINTROU, AMIKACIN in the last 72 hours.   Microbiology: Recent Results (from the past 720 hour(s))  Culture, blood (routine x 2)     Status: None (Preliminary result)   Collection Time: 11/15/14 11:21 PM  Result Value Ref Range Status   Specimen Description BLOOD RIGHT ARM  Final   Special Requests 2 BLOOD BAA  Final   Culture  Setup Time   Final    GRAM POSITIVE COCCI IN BOTH AEROBIC AND ANAEROBIC BOTTLES CRITICAL RESULT CALLED TO, READ BACK BY AND VERIFIED WITH: CHRIS BENNETT, RN 11/16/2014 AT 1500 BY JRS.    Culture   Final    GRAM POSITIVE COCCI IN BOTH AEROBIC AND ANAEROBIC BOTTLES IDENTIFICATION TO FOLLOW    Report Status PENDING  Incomplete  Culture, blood (routine x 2)     Status: None (Preliminary result)   Collection Time: 11/15/14 11:21 PM  Result Value Ref Range Status   Specimen Description BLOOD RIGHT HAND  Final   Special Requests 2 BLOOD BAA  Final   Culture  Setup Time   Final    GRAM POSITIVE COCCI IN BOTH AEROBIC AND ANAEROBIC BOTTLES CRITICAL VALUE NOTED.  VALUE IS CONSISTENT WITH PREVIOUSLY REPORTED AND CALLED VALUE.    Culture   Final    GRAM POSITIVE COCCI IN BOTH AEROBIC AND ANAEROBIC  BOTTLES IDENTIFICATION TO FOLLOW    Report Status PENDING  Incomplete    Anti-infectives    Start     Dose/Rate Route Frequency Ordered Stop   11/16/14 1400  vancomycin (VANCOCIN) IVPB 1000 mg/200 mL premix     1,000 mg 200 mL/hr over 60 Minutes Intravenous Every 12 hours 11/16/14 0456     11/16/14 0600  aztreonam (AZACTAM) injection 1 g  Status:  Discontinued     1 g Intramuscular 3 times per day 11/16/14 0431 11/16/14 0443   11/16/14 0600  aztreonam (AZACTAM) 1 g in dextrose 5 % 50 mL IVPB     1 g 100 mL/hr over 30 Minutes Intravenous 3 times per day 11/16/14 0443     11/16/14 0500  azithromycin (ZITHROMAX) 500 mg in dextrose 5 % 250 mL IVPB     500 mg 250 mL/hr over 60 Minutes Intravenous Every 24 hours 11/16/14 0431     11/16/14 0500  vancomycin (VANCOCIN) IVPB 1000 mg/200 mL premix     1,000 mg 200 mL/hr over 60 Minutes Intravenous  Once 11/16/14 0449 11/16/14 0718   11/16/14 0030  doxycycline (VIBRAMYCIN) 100 mg in dextrose 5 % 250 mL IVPB     100 mg 125 mL/hr over 120 Minutes Intravenous  Once 11/16/14 0010 11/16/14 0357      Assessment: 64 yo female brought ED via EMS with chest pain and hemoptysis. Patient has has recent  diagnosis outpatient of pneumonia and was prescribed levofloxacin.  Pharmacy consulted to monitor and dose vancomycin for treatment of pneumonia. Patient currently a receiving vancomycin 1gm every 12 hours, Azithromycin and aztreonam.   BC x2 showing GPC, ID and susceptibilities still pending.  CXR positive for LUL PNA, WBC 8.1, Tmax 99, Scr 0.62, CrCl 73 ml/min  Vd 42.6 L   Ke 0.072   T1/2 9.6 hr  Goal of Therapy:  Vancomycin trough level 15-20 mcg/ml  Plan:  Vanc trough 13, drawn today at 1334, prior to 3rd regimen dose.  Will continue vancomycin 1g every 12 hours and Will check true trough prior to 5th dose tomorrow at 1330.  Pharmacy will continue to monitor renal function and labs and make adjustments as needed.   Nancy Fetter,  PharmD Pharmacy Resident

## 2014-11-18 LAB — CULTURE, BLOOD (ROUTINE X 2)

## 2014-11-18 LAB — RAPID HIV SCREEN (HIV 1/2 AB+AG)
HIV 1/2 Antibodies: NONREACTIVE
HIV-1 P24 Antigen - HIV24: NONREACTIVE

## 2014-11-18 LAB — TROPONIN I: Troponin I: <0.03 ng/mL (ref <0.031)

## 2014-11-18 LAB — VANCOMYCIN, TROUGH: VANCOMYCIN TR: 14 ug/mL (ref 10–20)

## 2014-11-18 MED ORDER — VANCOMYCIN HCL 10 G IV SOLR
1250.0000 mg | Freq: Two times a day (BID) | INTRAVENOUS | Status: DC
  Administered 2014-11-18 – 2014-11-21 (×6): 1250 mg via INTRAVENOUS
  Filled 2014-11-18 (×7): qty 1250

## 2014-11-18 MED ORDER — MORPHINE SULFATE (PF) 2 MG/ML IV SOLN
2.0000 mg | INTRAVENOUS | Status: DC | PRN
  Administered 2014-11-18 – 2014-11-21 (×5): 2 mg via INTRAVENOUS
  Filled 2014-11-18 (×6): qty 1

## 2014-11-18 NOTE — Consult Note (Signed)
[facility]     Reason for Consult: PNA, strep mitis bacteremia   Referring Physician: Seth Bake Date of Admission:  11/15/2014   Active Problems:   Pneumonia   COPD (chronic obstructive pulmonary disease) (HCC)   GERD (gastroesophageal reflux disease)   Peripheral neuropathy ([place])   Peripheral vascular disease ([place])   HPI: Emily Kidd is a 64 y.o. female with a hx of COPD, followed by Dr Raul Del for abnormal CT scan chest, prior CVA, Htn, prior tobacco use who was admitted 10/16 with several weeks of increasing cough, hemoptysis and chest pain, sob.  Pt had been dxed with PNA as otpt and started on levofloxacin however sxs persisted and came to ED. CT done since admit shows infiltrate, as well as nodularity with one cavitary nodule (of note no comparison was done to prior CTs which have been abnormal. Today patient reports continuing to feel poorly with cough but no furhter blood. She is in the room with her mother. She reports loss of appetite but denies wt loss. Denies NS or fevers at home. Prior smoker but quit 2011 when had a CVA. Does report some dysphagia with foods since CVA. 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 Denies known TB contacts.   Past Medical History  Diagnosis Date  . Hypertension    History reviewed. No pertinent past surgical history. Social History  Substance Use Topics  . Smoking status: Former Research scientist (life sciences)  . Smokeless tobacco: None  . Alcohol Use: No   History reviewed. No pertinent family history.  Allergies:  Allergies  Allergen Reactions  . Penicillins Rash    Current antibiotics: Antibiotics Given (last 72 hours)    Date/Time Action Medication Dose Rate   11/16/14 0530 Given   aztreonam (AZACTAM) 1 g in dextrose 5 % 50 mL IVPB 1 g 100 mL/hr   11/16/14 0618 Given  [per pt condition, not in room 128 on arrival to floor]   vancomycin (VANCOCIN) IVPB 1000 mg/200 mL premix 1,000 mg  200 mL/hr   11/16/14 0731 Given  [Vancomycin infusing]   azithromycin (ZITHROMAX) 500 mg in dextrose 5 % 250 mL IVPB 500 mg 250 mL/hr   11/16/14 1553 Given   aztreonam (AZACTAM) 1 g in dextrose 5 % 50 mL IVPB 1 g 100 mL/hr   11/16/14 1631 Given   vancomycin (VANCOCIN) IVPB 1000 mg/200 mL premix 1,000 mg 200 mL/hr   11/16/14 2018 Given   aztreonam (AZACTAM) 1 g in dextrose 5 % 50 mL IVPB 1 g 100 mL/hr   11/17/14 0300 Given   vancomycin (VANCOCIN) IVPB 1000 mg/200 mL premix 1,000 mg 200 mL/hr   11/17/14 0413 Given   azithromycin (ZITHROMAX) 500 mg in dextrose 5 % 250 mL IVPB 500 mg 250 mL/hr   11/17/14 0520 Given   aztreonam (AZACTAM) 1 g in dextrose 5 % 50 mL IVPB 1 g 100 mL/hr   11/17/14 1317 Given   aztreonam (AZACTAM) 1 g in dextrose 5 % 50 mL IVPB 1 g 100 mL/hr   11/17/14 1445 Given   vancomycin (VANCOCIN) IVPB 1000 mg/200 mL premix 1,000 mg 200 mL/hr   11/17/14 2227 Given   aztreonam (AZACTAM) 1 g in dextrose 5 % 50 mL IVPB 1 g 100 mL/hr   11/18/14 0220 Given   vancomycin (VANCOCIN) IVPB 1000 mg/200 mL premix 1,000 mg 200 mL/hr   11/18/14 0437 Given   azithromycin (ZITHROMAX) 500  mg in dextrose 5 % 250 mL IVPB 500 mg 250 mL/hr   11/18/14 5573 Given   aztreonam (AZACTAM) 1 g in dextrose 5 % 50 mL IVPB 1 g 100 mL/hr   11/18/14 1502 Given   aztreonam (AZACTAM) 1 g in dextrose 5 % 50 mL IVPB 1 g 100 mL/hr   11/18/14 1802 Given   vancomycin (VANCOCIN) 1,250 mg in sodium chloride 0.9 % 250 mL IVPB 1,250 mg 166.7 mL/hr      MEDICATIONS: . amLODipine  5 mg Oral Daily   And  . atorvastatin  20 mg Oral q1800  . azithromycin  500 mg Intravenous Q24H  . aztreonam  1 g Intravenous 3 times per day  . citalopram  20 mg Oral Daily  . guaiFENesin  600 mg Oral BID  . oxybutynin  10 mg Oral QHS  . pregabalin  100 mg Oral Daily  . vancomycin  1,250 mg Intravenous Q12H    Review of Systems - 11 systems reviewed and negative per HPI   OBJECTIVE: Temp:  [97.2 F (36.2 C)-99.5 F  (37.5 C)] 98.1 F (36.7 C) (10/18 1229) Pulse Rate:  [96-116] 96 (10/18 1501) Resp:  [18-20] 18 (10/18 1229) BP: (108-120)/(52-71) 115/58 mmHg (10/18 1501) SpO2:  [93 %-97 %] 97 % (10/18 1501). Physical Exam  Constitutional:  oriented to person, place, and time. appears well-developed and well-nourished. No distress.  HENT: [place]/AT, PERRLA, no scleral icterus Mouth/Throat: Oropharynx is clear and moist. No oropharyngeal exudate.  Cardiovascular: Normal rate, regular rhythm and normal heart sounds.  Pulmonary/Chest: poor air movement  Neck = supple, no nuchal rigidity no lan Abdominal: Soft. Bowel sounds are normal.  exhibits no distension. There is no tenderness.  Lymphadenopathy: no cervical adenopathy. No axillary adenopathy Neurological: alert and oriented to person, place, and time.  Skin: Skin is warm and dry. No rash noted. No erythema.  Psychiatric: a normal mood and affect.  behavior is normal.    LABS: Results for orders placed or performed during the hospital encounter of 11/15/14 (from the past 48 hour(s))  Creatinine, serum     Status: None   Collection Time: 11/17/14  4:35 AM  Result Value Ref Range   Creatinine, Ser 0.62 0.44 - 1.00 mg/dL   GFR calc non Af Amer >60 >60 mL/min   GFR calc Af Amer >60 >60 mL/min    Comment: (NOTE) The eGFR has been calculated using the CKD EPI equation. This calculation has not been validated in all clinical situations. eGFR's persistently <60 mL/min signify possible Chronic Kidney Disease.   Influenza panel by PCR (type A & B, H1N1)     Status: None   Collection Time: 11/17/14  7:58 AM  Result Value Ref Range   Influenza A By PCR NEGATIVE NEGATIVE   Influenza B By PCR NEGATIVE NEGATIVE   H1N1 flu by pcr NOT DETECTED NOT DETECTED    Comment:        The Xpert Flu assay (FDA approved for nasal aspirates or washes and nasopharyngeal swab specimens), is intended as an aid in the diagnosis of influenza and should not be used as a  sole basis for treatment.   Vancomycin, trough     Status: None   Collection Time: 11/17/14  1:44 PM  Result Value Ref Range   Vancomycin Tr 13 10 - 20 ug/mL  Vancomycin, trough     Status: None   Collection Time: 11/18/14  1:28 PM  Result Value Ref Range   Vancomycin  Tr 14 10 - 20 ug/mL   No components found for: ESR, C REACTIVE PROTEIN MICRO: Recent Results (from the past 720 hour(s))  Culture, blood (routine x 2)     Status: None   Collection Time: 11/15/14 11:21 PM  Result Value Ref Range Status   Specimen Description BLOOD RIGHT ARM  Final   Special Requests 2 BLOOD BAA  Final   Culture  Setup Time   Final    GRAM POSITIVE COCCI IN BOTH AEROBIC AND ANAEROBIC BOTTLES CRITICAL RESULT CALLED TO, READ BACK BY AND VERIFIED WITH: CHRIS BENNETT, RN 11/16/2014 AT 1500 BY JRS.    Culture STREPTOCOCCUS MITIS  Final   Report Status 11/18/2014 FINAL  Final   Organism ID, Bacteria STREPTOCOCCUS MITIS  Final      Susceptibility   Streptococcus mitis - MIC*    AMPICILLIN Value in next row Sensitive      SENSITIVE<=0.25    CLINDAMYCIN Value in next row Sensitive      SENSITIVE<=0.25    LEVOFLOXACIN Value in next row Sensitive      SENSITIVE1    ERYTHROMYCIN Value in next row Sensitive      SENSITIVE<=0.12    VANCOMYCIN Value in next row Sensitive      SENSITIVE0.5    * STREPTOCOCCUS MITIS/ORALIS  Culture, blood (routine x 2)     Status: None (Preliminary result)   Collection Time: 11/15/14 11:21 PM  Result Value Ref Range Status   Specimen Description BLOOD RIGHT HAND  Final   Special Requests 2 BLOOD BAA  Final   Culture  Setup Time   Final    GRAM POSITIVE COCCI IN BOTH AEROBIC AND ANAEROBIC BOTTLES CRITICAL VALUE NOTED.  VALUE IS CONSISTENT WITH PREVIOUSLY REPORTED AND CALLED VALUE.    Culture   Final    GRAM POSITIVE COCCI IN BOTH AEROBIC AND ANAEROBIC BOTTLES IDENTIFICATION TO FOLLOW    Report Status PENDING  Incomplete  Culture, blood (routine x 2)     Status: None    Collection Time: 11/16/14  4:32 AM  Result Value Ref Range Status   Report Status 11/18/2014 FINAL  Final  Culture, blood (routine x 2)     Status: None   Collection Time: 11/16/14  4:37 AM  Result Value Ref Range Status   Report Status 11/18/2014 FINAL  Final    IMAGING: Dg Chest 2 View  11/15/2014  CLINICAL DATA:  Initial valuation for one-day history of mid is chest pain, shortness of breath, cough. EXAM: CHEST  2 VIEW COMPARISON:  Prior study from 03/02/2014. FINDINGS: Cardiomegaly present. Atheromatous plaque within the aortic arch. Mediastinal silhouette otherwise unremarkable. There is parenchymal infiltrate within the left upper lobe, concerning for pneumonia. No other focal airspace disease. Right lung is grossly clear. No pulmonary edema or pleural effusion. No acute osseous abnormality. IMPRESSION: 1. Parenchymal left upper lobe infiltrate, concerning for pneumonia. 2. Cardiomegaly without pulmonary edema. Electronically Signed   By: Jeannine Boga M.D.   On: 11/15/2014 22:40   Ct Angio Chest Pe W/cm &/or Wo Cm  11/16/2014  CLINICAL DATA:  Chest pain radiating to the left arm.  Headache. EXAM: CT ANGIOGRAPHY CHEST WITH CONTRAST TECHNIQUE: Multidetector CT imaging of the chest was performed using the standard protocol during bolus administration of intravenous contrast. Multiplanar CT image reconstructions and MIPs were obtained to evaluate the vascular anatomy. CONTRAST:  116m OMNIPAQUE IOHEXOL 350 MG/ML SOLN COMPARISON:  None. FINDINGS: There is adequate opacification of the pulmonary arteries. There is  no pulmonary embolus. The main pulmonary artery, right main pulmonary artery and left main pulmonary arteries are normal in size. The heart size is normal. There is no pericardial effusion. There is left upper lobe consolidation. There is patchy left lower lobe airspace disease. There is small nodular airspace disease in the right lower lobe. There is a small cavitary right lower  lobe nodule measuring 10 mm. There is no pneumothorax. There is subcarinal lymphadenopathy measuring 2.4 cm in short axis. There is an enlarged right hilar lymph node measuring 13 mm in short axis. There is an enlarged left hilar lymph node measuring 14 mm in short axis. There is no lytic or blastic osseous lesion. The visualized portions of the upper abdomen are unremarkable. Review of the MIP images confirms the above findings. IMPRESSION: 1. Left upper lobe and to a lesser degree left lower lobe pneumonia. Followup PA and lateral chest X-ray is recommended in 3-4 weeks following trial of antibiotic therapy to ensure resolution and exclude underlying malignancy. 2. Small area of nodular airspace disease in the right lower lobe which may reflect atelectasis versus infection. 3. Small 10 mm cavitary nodule in the right lower lobe may be secondary to infection 4. No evidence of pulmonary embolus. Electronically Signed   By: Kathreen Devoid   On: 11/16/2014 02:00   PRIOR IMAGING Jan 2015  PET 2015 Reviewed  But unable to be imported     Assessment:   Emily Kidd is a 64 y.o. female with a hx of COPD, followed by Dr Raul Del for abnormal CT scan chest, prior CVA, Htn, prior tobacco use who was admitted 10/16 with several weeks of increasing cough, hemoptysis and chest pain, sob.  Pt had been dxed with PNA as otpt and started on levofloxacin however sxs persisted and came to ED. CT done since admit shows infiltrate, as well as nodularity with one cavitary nodule (of note no comparison was done to prior CTs which have been abnormal. Today patient reports continuing to feel poorly with cough but no furhter blood. She is in the room with her mother. She reports loss of appetite but denies wt loss. Denies NS or fevers at home. Prior smoker but quit 2011 when had a CVA. Does report some dysphagia with foods since CVA. 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 Denies known TB  contacts.  She also has 2/2 bc + for GPC - so far one is IDed as  strep mitis which is a viridans strep. She has nml echo. Has no teeth and no recent dental work.  I have reviewed her prior CTs and PET scan and discussed with Dr Raul Del who has seen her several times in the past. I suspect an atypical infection such as MAI, TB fungal.  Also a potential aspiration risk given prior CVA and her report of some dysphagia.  Unclear how the viridans strep bacteremia is involved. Usually would be from dental infection but she is edentulous.   Recommendations Check routine sputum and AFB I would strongly suggest a Bronch this admission to possibly bxp and send AFB, Fungal and routine samples Check HIV.  Continue current abx pending ID of other + bcx - may be contaminant.  Repeat bcx 10.16 ngtd  Thank you very much for allowing me to participate in the care of this patient. Please call with questions.   Cheral Marker. Ola Spurr, MD

## 2014-11-18 NOTE — Consult Note (Signed)
[facility] Consultation      Assessment and Plan:  Pneumonia with hemoptysis.  -Review of CT chest shows a dense LUL pneumonia. There is also hilar and subcarinal lymphadenopathy. The patients hemoptysis is likely due to pneumonia while on plavix and aspirin.  -Awaiting TB quantiferon test results, PPD.  -Staph in blood GPC in 2/2 bottles; await identification, if negative can DC vanco and continue coverage for pneumonia.  -Pt will need outpatient follow up with follow up CT chest in 6-8 weeks -Await results of TB QuantiFERON, if negative the patient can be restarted on Plavix and aspirin, and discharged home. Please notify us if the test is positive, as the patient may require bronchoscopy at that point. Will sign off for now, please call if needed.  Mycoplasma IgM Negative.  Viral influenza PCR negative.   Acute hypoxic respiratory failure.  -This has resolved  Left lung atelectasis with mediastinal shift. -Due to pneumonia.     Date: 11/18/2014  MRN# [id number] Emily Kidd 1950-03-23  Referring Physician:   [name] is a 64 y.o. old female seen in consultation for chief complaint of:    Chief Complaint  Patient presents with  . Chest Pain    HPI:   Alert and awake, no resp distress, hemoptysis resolved, breathing much improved, off oxygen, currently oxygen saturation is 97% on room air.  PMHX:   Past Medical History  Diagnosis Date  . Hypertension    Surgical Hx:  History reviewed. No pertinent past surgical history. Family Hx:  History reviewed. No pertinent family history. Social Hx:   Social History  Substance Use Topics  . Smoking status: Former Research scientist (life sciences)  . Smokeless tobacco: None  . Alcohol Use: No   Medication:   No current outpatient prescriptions on file.    Allergies:  Penicillins  Review of Systems: Gen:  Denies  fever, sweats, chills HEENT: Denies blurred vision, double vision.  Cvc:  No dizziness, chest  pain. Resp:   Denies cough or sputum porduction,  Gi: Denies swallowing difficulty, stomach pain. Gu:  Denies bladder incontinence.  Ext:   No Joint pain, stiffness. Skin: No skin rash,  hives Endoc:  No polyuria, polydipsia. Psych: No depression, insomnia. Other:  All other systems were reviewed with the patient and were negative other that what is mentioned in the HPI.   Physical Examination:   VS: BP 119/58 mmHg  Pulse 106  Temp(Src) 97.2 F (36.2 C) (Oral)  Resp 20  Ht '5\' 6"'$  (1.676 m)  Wt 160 lb (72.576 kg)  BMI 25.84 kg/m2  SpO2 93%  General Appearance: No distress  Neuro:without focal findings,  speech normal,  HEENT: PERRLA, EOM intact.   Pulmonary: Left lung crackles.  No wheezing.  CardiovascularNormal S1,S2.  No m/r/g.   Abdomen: Benign, Soft, non-tender. Renal:  No costovertebral tenderness  GU:  No performed at this time. Endoc: No evident thyromegaly, no signs of acromegaly. Skin:   warm, no rashes, no ecchymosis  Extremities: normal, no cyanosis, clubbing.    LABORATORY PANEL:   CBC  Recent Labs Lab 11/15/14 2110  WBC 8.1  HGB 13.1  HCT 39.9  PLT 328   ------------------------------------------------------------------------------------------------------------------  Chemistries   Recent Labs Lab 11/15/14 2110 11/17/14 0435  NA 142  --   K 3.2*  --   CL 109  --   CO2 26  --   GLUCOSE 94  --   BUN 6  --   CREATININE 0.67 0.62  CALCIUM 8.7*  --    ------------------------------------------------------------------------------------------------------------------  Cardiac Enzymes  Recent Labs Lab 11/15/14 2110  TROPONINI <0.03   ------------------------------------------------------------  RADIOLOGY:  No results found.    Marda Stalker, M.D.

## 2014-11-18 NOTE — Plan of Care (Signed)
Problem: Discharge Progression Outcomes Goal: Other Discharge Outcomes/Goals Plan of care progress to goal: - Complained of headache, tylenol given with improvement. - Continues ABX. - Uses bedpan with assist. - VSS. - Airborne precautions continues. Will continue to monitor.

## 2014-11-18 NOTE — Progress Notes (Signed)
ANTIBIOTIC CONSULT NOTE - FOLLOW UP  Pharmacy Consult for vancomycin Indication: pneumonia  Allergies  Allergen Reactions  . Penicillins Rash    Patient Measurements: Height: '5\' 6"'$  (167.6 cm) Weight: 160 lb (72.576 kg) IBW/kg (Calculated) : 59.3 Adjusted Body Weight: na  Vital Signs: Temp: 98.1 F (36.7 C) (10/18 1229) Temp Source: Oral (10/18 1229) BP: 115/58 mmHg (10/18 1501) Pulse Rate: 96 (10/18 1501) Intake/Output from previous day: 10/17 0701 - 10/18 0700 In: -  Out: 600 [Urine:600] Intake/Output from this shift: Total I/O In: -  Out: 305 [Urine:305]  Labs:  Recent Labs  11/15/14 2110 11/17/14 0435  WBC 8.1  --   HGB 13.1  --   PLT 328  --   CREATININE 0.67 0.62   Estimated Creatinine Clearance: 72.5 mL/min (by C-G formula based on Cr of 0.62).  Recent Labs  11/17/14 1344 11/18/14 1328  [place] 14     Microbiology: Recent Results (from the past 720 hour(s))  Culture, blood (routine x 2)     Status: None   Collection Time: 11/15/14 11:21 PM  Result Value Ref Range Status   Specimen Description BLOOD RIGHT ARM  Final   Special Requests 2 BLOOD BAA  Final   Culture  Setup Time   Final    GRAM POSITIVE COCCI IN BOTH AEROBIC AND ANAEROBIC BOTTLES CRITICAL RESULT CALLED TO, READ BACK BY AND VERIFIED WITH: CHRIS BENNETT, RN 11/16/2014 AT 1500 BY JRS.    Culture STREPTOCOCCUS MITIS  Final   Report Status 11/18/2014 FINAL  Final   Organism ID, Bacteria STREPTOCOCCUS MITIS  Final      Susceptibility   Streptococcus mitis - MIC*    AMPICILLIN Value in next row Sensitive      SENSITIVE<=0.25    CLINDAMYCIN Value in next row Sensitive      SENSITIVE<=0.25    LEVOFLOXACIN Value in next row Sensitive      SENSITIVE1    ERYTHROMYCIN Value in next row Sensitive      SENSITIVE<=0.12    VANCOMYCIN Value in next row Sensitive      SENSITIVE0.5    * STREPTOCOCCUS MITIS/ORALIS  Culture, blood (routine x 2)     Status: None (Preliminary result)   Collection Time: 11/15/14 11:21 PM  Result Value Ref Range Status   Specimen Description BLOOD RIGHT HAND  Final   Special Requests 2 BLOOD BAA  Final   Culture  Setup Time   Final    GRAM POSITIVE COCCI IN BOTH AEROBIC AND ANAEROBIC BOTTLES CRITICAL VALUE NOTED.  VALUE IS CONSISTENT WITH PREVIOUSLY REPORTED AND CALLED VALUE.    Culture   Final    GRAM POSITIVE COCCI IN BOTH AEROBIC AND ANAEROBIC BOTTLES IDENTIFICATION TO FOLLOW    Report Status PENDING  Incomplete  Culture, blood (routine x 2)     Status: None   Collection Time: 11/16/14  4:32 AM  Result Value Ref Range Status   Report Status 11/18/2014 FINAL  Final  Culture, blood (routine x 2)     Status: None   Collection Time: 11/16/14  4:37 AM  Result Value Ref Range Status   Report Status 11/18/2014 FINAL  Final    Anti-infectives    Start     Dose/Rate Route Frequency Ordered Stop   11/18/14 1600  vancomycin (VANCOCIN) 1,250 mg in sodium chloride 0.9 % 250 mL IVPB     1,250 mg 166.7 mL/hr over 90 Minutes Intravenous Every 12 hours 11/18/14 1537  11/16/14 1400  vancomycin (VANCOCIN) IVPB 1000 mg/200 mL premix  Status:  Discontinued     1,000 mg 200 mL/hr over 60 Minutes Intravenous Every 12 hours 11/16/14 0456 11/18/14 1537   11/16/14 0600  aztreonam (AZACTAM) injection 1 g  Status:  Discontinued     1 g Intramuscular 3 times per day 11/16/14 0431 11/16/14 0443   11/16/14 0600  aztreonam (AZACTAM) 1 g in dextrose 5 % 50 mL IVPB     1 g 100 mL/hr over 30 Minutes Intravenous 3 times per day 11/16/14 0443     11/16/14 0500  azithromycin (ZITHROMAX) 500 mg in dextrose 5 % 250 mL IVPB     500 mg 250 mL/hr over 60 Minutes Intravenous Every 24 hours 11/16/14 0431     11/16/14 0500  vancomycin (VANCOCIN) IVPB 1000 mg/200 mL premix     1,000 mg 200 mL/hr over 60 Minutes Intravenous  Once 11/16/14 0449 11/16/14 0718   11/16/14 0030  doxycycline (VIBRAMYCIN) 100 mg in dextrose 5 % 250 mL IVPB     100 mg 125 mL/hr over 120  Minutes Intravenous  Once 11/16/14 0010 11/16/14 0357      Assessment: Patient currently ordered Vancomycin 1g IV q12h, trough resulted at 4mg/ml after 5th dose.  Goal of Therapy:  Vancomycin trough level 15-20 mcg/ml  Plan:  Measure antibiotic drug levels at steady state Follow up culture results Will increase dose to Vancomycin '1250mg'$  IV q12h.  LPaulina Fusi PharmD, BCPS 11/18/2014 3:39 PM

## 2014-11-18 NOTE — Progress Notes (Signed)
[facility] NAME: Emily Kidd    MR#:  [id number]  DATE OF BIRTH:  1950/10/05  SUBJECTIVE:  CHIEF COMPLAINT:   Chief Complaint  Patient presents with  . Chest Pain   patient is 64 year old female with history of diagnosis of pneumonia approximately 2 weeks ago who presents to the hospital for shortness of breath, chills and wheezing. She also noted some hemoptysis. In emergency room, she was noted to be hypoxic with O2 sats in 80s. Patient's chest x-ray revealed a left upper lobe pneumonia , CT scan of the chest revealed left upper lobe as well as left lower lobe pneumonia as well as 10 mm cavitary nodule in the right lower lobe . She was admitted for evaluation and treatment. PPD was placed, as well as QuantiFERON test was taken for TB, patient is placed in respiratory isolation. She feels much improved today. No hemoptysis. No shortness of breath. PPD is negative at this time. QuantiFERON gold results will be available in 3 days after test is taken. She complains of less cough or sputum production. Feels good today and wants to go home. Pulmonary he is recommended to wait for QuantiFERON testing. Mycoplasma testing as well as influenza test is negative. Blood cultures are positive for Streptococcus, repeated blood cultures are still pending.   Review of Systems  Constitutional: Negative for fever, chills and weight loss.  HENT: Negative for congestion.   Eyes: Negative for blurred vision and double vision.  Respiratory: Negative for cough, sputum production, shortness of breath and wheezing.   Cardiovascular: Negative for chest pain, palpitations, orthopnea, leg swelling and PND.  Gastrointestinal: Negative for nausea, vomiting, abdominal pain, diarrhea, constipation and blood in stool.  Genitourinary: Negative for dysuria, urgency, frequency and hematuria.  Musculoskeletal: Negative for falls.  Neurological: Negative for dizziness,  tremors, focal weakness and headaches.  Endo/Heme/Allergies: Does not bruise/bleed easily.  Psychiatric/Behavioral: Negative for depression. The patient does not have insomnia.     VITAL SIGNS: Blood pressure 120/71, pulse 116, temperature 97.2 F (36.2 C), temperature source Oral, resp. rate 18, height '5\' 6"'$  (1.676 m), weight 72.576 kg (160 lb), SpO2 93 %.  PHYSICAL EXAMINATION:   GENERAL:  64 y.o.-year-old patient lying in the bed with no acute distress. He was sitting on the bed in bed more than ready to sign out against medical advise.  EYES: Pupils equal, round, reactive to light and accommodation. No scleral icterus. Extraocular muscles intact.  HEENT: Head atraumatic, normocephalic. Oropharynx and nasopharynx clear.  NECK:  Supple, no jugular venous distention. No thyroid enlargement, no tenderness.  LUNGS: Normal breath sounds bilaterally, no wheezing, few rales and rhonchi , no crepitation. No use of accessory muscles of respiration.  CARDIOVASCULAR: S1, S2 normal. No murmurs, rubs, or gallops.  ABDOMEN: Soft, nontender, nondistended. Bowel sounds present. No organomegaly or mass.  EXTREMITIES: No pedal edema, cyanosis, or clubbing.  NEUROLOGIC: Cranial nerves II through XII are intact. Muscle strength 5/5 in all extremities. Sensation intact. Gait not checked.  PSYCHIATRIC: The patient is alert and oriented x 3.  SKIN: No obvious rash, lesion, or ulcer.   ORDERS/RESULTS REVIEWED:   CBC  Recent Labs Lab 11/15/14 2110  WBC 8.1  HGB 13.1  HCT 39.9  PLT 328  MCV 85.1  MCH 27.8  MCHC 32.7  RDW 15.9*  LYMPHSABS 1.4  MONOABS 0.7  EOSABS 0.3  BASOSABS 0.0   ------------------------------------------------------------------------------------------------------------------  Chemistries   Recent Labs Lab 11/15/14 2110  11/17/14 0435  NA 142  --   K 3.2*  --   CL 109  --   CO2 26  --   GLUCOSE 94  --   BUN 6  --   CREATININE 0.67 0.62  CALCIUM 8.7*  --     ------------------------------------------------------------------------------------------------------------------ estimated creatinine clearance is 72.5 mL/min (by C-G formula based on Cr of 0.62). ------------------------------------------------------------------------------------------------------------------ No results for input(s): TSH, T4TOTAL, T3FREE, THYROIDAB in the last 72 hours.  Invalid input(s): FREET3  Cardiac Enzymes  Recent Labs Lab 11/15/14 2110  TROPONINI <0.03   ------------------------------------------------------------------------------------------------------------------ Invalid input(s): POCBNP ---------------------------------------------------------------------------------------------------------------  RADIOLOGY: No results found.  EKG:  Orders placed or performed in visit on 03/02/14  . EKG 12-Lead  . EKG 12-Lead    ASSESSMENT AND PLAN:  Active Problems:   Pneumonia   COPD (chronic obstructive pulmonary disease) (HCC)   GERD (gastroesophageal reflux disease)   Peripheral neuropathy (HCC)   Peripheral vascular disease (HCC) 1. Streptococcus sepsis , awaiting identification, sensitivities. Continue vancomycin for now. Repeated blood cultures were taken, pending,  echocardiogram is normal. No valvular abnormalities found. Get ID consult 2 Acute respiratory failure with hypoxia due to pneumonia, oxygenation has improved significantly and now patient is on room air and saturating 100% 3. Left upper lobe and left lower lobe bacterial pneumonia of unclear but bacteriologic agent at present, continue broad-spectrum antibiotic therapy with Zithromax and Azactam and vancomycin. Attempting to obtain Sputum cultures. So far unsuccessful.  4. Hypokalemia supplementing IV, check level tomorrow morning 5. Hemoptysis, appreciate pulmonary consultation, pneumonia related, QuantiFERON test is done, results pending. Appreciate pulmonary consultation   Management  plans discussed with the patient, family and they are in agreement.   DRUG ALLERGIES:  Allergies  Allergen Reactions  . Penicillins Rash    CODE STATUS:     Code Status Orders        Start     Ordered   11/16/14 0549  Full code   Continuous     11/16/14 0548    Advance Directive Documentation        Most Recent Value   Type of Advance Directive  Healthcare Power of Attorney   Pre-existing out of facility DNR order (yellow form or pink MOST form)     "MOST" Form in Place?        TOTAL TIME TAKING CARE OF THIS PATIENT: 60 minutes.  Discharge planning was discussed this patient and leaving hospital AGAINST MEDICAL ADVICE issues were also discussed. Care was discussed with patient's mother as well. Time spent for coordination of care 20 minutes  Carnelia Oscar M.D on 11/18/2014 at 2:26 PM  Between 7am to 6pm - Pager - [phone]  After 6pm go to [email or web address] - password [email or web address] [facility]  Office  [phone]  CC: Primary care physician; [place]

## 2014-11-18 NOTE — Plan of Care (Signed)
Still waiting on results of quantiferon TB Gold test.  PPD was negative.  Pt still getting IV abx.  May need to get IV abx upon d/c -would have to go to rehab for this and require PICC.  Had Infectious Disease consult today. Pt c/o chest pain and headache - Called dr - EKG negative.  Relieved w/O2 and gave morphine 1x.  Negative for flu.  [facility] dr came to see her today. Possible d/c tomorrow if we get lab tests back.

## 2014-11-19 ENCOUNTER — Inpatient Hospital Stay: Payer: Medicare (Managed Care)

## 2014-11-19 LAB — QUANTIFERON TB GOLD ASSAY (BLOOD)

## 2014-11-19 LAB — CULTURE, BLOOD (ROUTINE X 2)

## 2014-11-19 LAB — QUANTIFERON IN TUBE
QFT TB AG MINUS NIL VALUE: <0 IU/mL
QUANTIFERON MITOGEN VALUE: 4.41 [IU]/mL
QUANTIFERON NIL VALUE: 0.05 [IU]/mL
QUANTIFERON TB AG VALUE: 0.03 [IU]/mL
QUANTIFERON TB GOLD: NEGATIVE

## 2014-11-19 LAB — POTASSIUM: Potassium: 3.5 mmol/L (ref 3.5–5.1)

## 2014-11-19 LAB — MAGNESIUM: MAGNESIUM: 2.2 mg/dL (ref 1.7–2.4)

## 2014-11-19 LAB — TROPONIN I: Troponin I: <0.03 ng/mL (ref <0.031)

## 2014-11-19 MED ORDER — IOHEXOL 350 MG/ML SOLN
65.0000 mL | Freq: Once | INTRAVENOUS | Status: AC | PRN
  Administered 2014-11-19: 65 mL via INTRAVENOUS

## 2014-11-19 MED ORDER — LIDOCAINE HCL 2 % EX GEL
1.0000 "application " | Freq: Once | CUTANEOUS | Status: DC
  Filled 2014-11-19: qty 5

## 2014-11-19 NOTE — Progress Notes (Signed)
ANTIBIOTIC CONSULT NOTE - FOLLOW UP  Pharmacy Consult for Vancomycin Indication: pneumonia  Allergies  Allergen Reactions  . Penicillins Rash    Patient Measurements: Height: '5\' 6"'$  (167.6 cm) Weight: 160 lb (72.576 kg) IBW/kg (Calculated) : 59.3 Adjusted Body Weight: n/a  Vital Signs: Temp: 98.6 F (37 C) (10/19 0524) Temp Source: Oral (10/19 0524) BP: 117/65 mmHg (10/19 0524) Pulse Rate: 111 (10/19 0524) Intake/Output from previous day: 10/18 0701 - 10/19 0700 In: -  Out: 305 [Urine:305] Intake/Output from this shift:    Labs:  Recent Labs  11/17/14 0435  CREATININE 0.62   Estimated Creatinine Clearance: 72.5 mL/min (by C-G formula based on Cr of 0.62).  Recent Labs  11/17/14 1344 11/18/14 1328  [place] [facility]     Microbiology: Recent Results (from the past 720 hour(s))  Culture, blood (routine x 2)     Status: None   Collection Time: 11/15/14 11:21 PM  Result Value Ref Range Status   Specimen Description BLOOD RIGHT ARM  Final   Special Requests 2 BLOOD BAA  Final   Culture  Setup Time   Final    GRAM POSITIVE COCCI IN BOTH AEROBIC AND ANAEROBIC BOTTLES CRITICAL RESULT CALLED TO, READ BACK BY AND VERIFIED WITH: CHRIS BENNETT, RN 11/16/2014 AT 1500 BY JRS.    Culture STREPTOCOCCUS MITIS  Final   Report Status 11/18/2014 FINAL  Final   Organism ID, Bacteria STREPTOCOCCUS MITIS  Final      Susceptibility   Streptococcus mitis - MIC*    AMPICILLIN Value in next row Sensitive      SENSITIVE<=0.25    CLINDAMYCIN Value in next row Sensitive      SENSITIVE<=0.25    LEVOFLOXACIN Value in next row Sensitive      SENSITIVE1    ERYTHROMYCIN Value in next row Sensitive      SENSITIVE<=0.12    VANCOMYCIN Value in next row Sensitive      SENSITIVE0.5    * STREPTOCOCCUS MITIS/ORALIS  Culture, blood (routine x 2)     Status: None   Collection Time: 11/15/14 11:21 PM  Result Value Ref Range Status   Specimen Description BLOOD RIGHT HAND  Final   Special  Requests 2 BLOOD BAA  Final   Culture  Setup Time   Final    GRAM POSITIVE COCCI IN BOTH AEROBIC AND ANAEROBIC BOTTLES CRITICAL VALUE NOTED.  VALUE IS CONSISTENT WITH PREVIOUSLY REPORTED AND CALLED VALUE.    Culture   Final    STREPTOCOCCUS MITIS IN BOTH AEROBIC AND ANAEROBIC BOTTLES REFER TO OTHER SET FOR SUSCEPTIBILITIES    Report Status 11/19/2014 FINAL  Final  Culture, blood (routine x 2)     Status: None   Collection Time: 11/16/14  4:32 AM  Result Value Ref Range Status   Report Status 11/18/2014 FINAL  Final  Culture, blood (routine x 2)     Status: None   Collection Time: 11/16/14  4:37 AM  Result Value Ref Range Status   Report Status 11/18/2014 FINAL  Final    Anti-infectives    Start     Dose/Rate Route Frequency Ordered Stop   11/18/14 1600  vancomycin (VANCOCIN) 1,250 mg in sodium chloride 0.9 % 250 mL IVPB     1,250 mg 166.7 mL/hr over 90 Minutes Intravenous Every 12 hours 11/18/14 1537     11/16/14 1400  vancomycin (VANCOCIN) IVPB 1000 mg/200 mL premix  Status:  Discontinued     1,000 mg 200 mL/hr over 60 Minutes  Intravenous Every 12 hours 11/16/14 0456 11/18/14 1537   11/16/14 0600  aztreonam (AZACTAM) injection 1 g  Status:  Discontinued     1 g Intramuscular 3 times per day 11/16/14 0431 11/16/14 0443   11/16/14 0600  aztreonam (AZACTAM) 1 g in dextrose 5 % 50 mL IVPB     1 g 100 mL/hr over 30 Minutes Intravenous 3 times per day 11/16/14 0443     11/16/14 0500  azithromycin (ZITHROMAX) 500 mg in dextrose 5 % 250 mL IVPB     500 mg 250 mL/hr over 60 Minutes Intravenous Every 24 hours 11/16/14 0431     11/16/14 0500  vancomycin (VANCOCIN) IVPB 1000 mg/200 mL premix     1,000 mg 200 mL/hr over 60 Minutes Intravenous  Once 11/16/14 0449 11/16/14 0718   11/16/14 0030  doxycycline (VIBRAMYCIN) 100 mg in dextrose 5 % 250 mL IVPB     100 mg 125 mL/hr over 120 Minutes Intravenous  Once 11/16/14 0010 11/16/14 0357      Assessment: Patient currently ordered  Vancomycin '1250mg'$  IV q12h. Dose was increased on 10/18 due to trough level slightly subtherapeutic. Patient received contrast dye today so will need to watch renal function closely. Last SCr was 0.62 on 11/17/14.  Goal of Therapy:  Vancomycin trough level 15-20 mcg/ml  Plan:  Measure antibiotic drug levels at steady state Follow up culture results Will check a SCr with 11/20/14 am labs. May need to check daily for a few days if patient remains on Vancomycin. Will check trough level on 11/20/14 as well.  Paulina Fusi, PharmD, BCPS 11/19/2014 10:30 AM

## 2014-11-19 NOTE — Care Management Important Message (Signed)
Important Message  Patient Details  Name: Emily Kidd MRN: [id number] Date of Birth: [birth year]   Medicare Important Message Given:  Yes-third notification given    Shelbie Ammons, RN 11/19/2014, 3:05 PM

## 2014-11-19 NOTE — Plan of Care (Signed)
Pt's quantiferon blood test negative for TB.  Had CT of maxillary/facial  Area for pain - showed  R upper lip swelling but no obvious abscess.  ENT/Surgical consult talked to pt who sd lip protrudes b/c of previous stroke and not wearing her dentures.  Pt still on IV abx.  Will have outpt Bron[facility] on Monday b/c she's been on ASA/Plavix in last wk and must be off meds for 7 days.  Likely d/c home tomorrow.  [facility]'s followed by [facility].  Her PCP, Dr. Ovid Curd has looked in on pt past 2 days.

## 2014-11-19 NOTE — Consult Note (Addendum)
Emily Kidd, Emily Kidd [id number] [birth year] Riley Nearing, MD  Reason for Consult: Lower lip swelling Requesting Physician: Theodoro Grist, MD Consulting Physician: Riley Nearing, MD  HPI: This 64 y.o. year old female was admitted on 11/15/2014 for Community acquired pneumonia [J18.9] Hypoxia [R09.02]. I'm consulted to evaluate for a possible abscess in the lower lip. She had a CT scan which showed no evidence of an abscess in the lower lip. The patient denies any lip swelling or pain currently. She says her lip protrudes because of a prior stroke in the fact that she's not wearing her dentures. She denies any lip pain. She has a history of diagnosis of pneumonia approximately 2 weeks ago and presented to the hospital for shortness of breath, chills and wheezing. She also noted some hemoptysis. In emergency room CT scan of the chest revealed left upper lobe as well as left lower lobe pneumonia as well as 10 mm cavitary nodule in the right lower lobe . She was admitted for evaluation and treatment. PPD is negative at this time. QuantiFERON gold results will be available in 3 days after test is taken. Blood cultures are positive for Streptococcus, repeated blood cultures are still pending. Patient was seen by Dr. Ola Spurr, who recommended to get bronchoscopy during this admission. Dr.Kasa tentatively scheduled bronchoscopy for Monday afternoon.   Medications:  Current Facility-Administered Medications  Medication Dose Route Frequency Provider Last Rate Last Dose  . acetaminophen (TYLENOL) tablet 650 mg  650 mg Oral Q6H PRN Quintella Baton, MD   650 mg at 11/19/14 0305   Or  . acetaminophen (TYLENOL) suppository 650 mg  650 mg Rectal Q6H PRN Debby Crosley, MD      . albuterol (PROVENTIL) (2.5 MG/3ML) 0.083% nebulizer solution 2.5 mg  2.5 mg Nebulization Q6H PRN Debby Crosley, MD   2.5 mg at 11/17/14 0911  . alum & mag hydroxide-simeth (MAALOX/MYLANTA) 200-200-20 MG/5ML suspension 30 mL  30 mL Oral Q6H PRN  Debby Crosley, MD   30 mL at 11/19/14 0301  . amLODipine (NORVASC) tablet 5 mg  5 mg Oral Daily Debby Crosley, MD   5 mg at 11/18/14 1803   And  . atorvastatin (LIPITOR) tablet 20 mg  20 mg Oral q1800 Debby Crosley, MD   20 mg at 11/18/14 1803  . azithromycin (ZITHROMAX) 500 mg in dextrose 5 % 250 mL IVPB  500 mg Intravenous Q24H Debby Crosley, MD   500 mg at 11/19/14 0501  . aztreonam (AZACTAM) 1 g in dextrose 5 % 50 mL IVPB  1 g Intravenous 3 times per day Quintella Baton, MD   1 g at 11/19/14 1536  . citalopram (CELEXA) tablet 20 mg  20 mg Oral Daily Debby Crosley, MD   20 mg at 11/19/14 1041  . guaiFENesin (MUCINEX) 12 hr tablet 600 mg  600 mg Oral BID Debby Crosley, MD   600 mg at 11/19/14 1042  . lidocaine (XYLOCAINE) 2 % jelly 1 application  1 application Topical Once Flora Lipps, MD      . LORazepam (ATIVAN) tablet 1 mg  1 mg Oral Q12H PRN Quintella Baton, MD   1 mg at 11/19/14 0306  . metoprolol (LOPRESSOR) injection 5 mg  5 mg Intravenous Q6H PRN Debby Crosley, MD      . morphine 2 MG/ML injection 2 mg  2 mg Intravenous Q3H PRN Theodoro Grist, MD   2 mg at 11/18/14 2158  . oxybutynin (DITROPAN-XL) 24 hr tablet 10 mg  10 mg Oral QHS Debby  Crosley, MD   10 mg at 11/18/14 2015  . pregabalin (LYRICA) capsule 100 mg  100 mg Oral Daily Debby Crosley, MD   100 mg at 11/19/14 1041  . promethazine (PHENERGAN) tablet 12.5 mg  12.5 mg Oral Q6H PRN Debby Crosley, MD   12.5 mg at 11/16/14 1600  . senna-docusate (Senokot-S) tablet 1 tablet  1 tablet Oral QHS PRN Quintella Baton, MD   1 tablet at 11/18/14 1249  . traZODone (DESYREL) tablet 150 mg  150 mg Oral QHS PRN Quintella Baton, MD   150 mg at 11/18/14 2015  . vancomycin (VANCOCIN) 1,250 mg in sodium chloride 0.9 % 250 mL IVPB  1,250 mg Intravenous Q12H Vira Blanco, RPH   1,250 mg at 11/19/14 0308  .  Medications Prior to Admission  Medication Sig Dispense Refill  . acetaminophen (TYLENOL) 325 MG tablet Take 1 tablet by mouth every 4 (four) hours as  needed.    Marland Kitchen albuterol (PROAIR HFA) 108 (90 BASE) MCG/ACT inhaler Inhale into the lungs.    Marland Kitchen albuterol (PROVENTIL) (2.5 MG/3ML) 0.083% nebulizer solution Inhale into the lungs.    Marland Kitchen amLODipine-atorvastatin (CADUET) 5-20 MG tablet Take 1 tablet by mouth daily.     Marland Kitchen aspirin EC 81 MG tablet Take 81 mg by mouth daily.     . celecoxib (CELEBREX) 200 MG capsule Take 200 mg by mouth daily.     . citalopram (CELEXA) 20 MG tablet Take 20 mg by mouth daily.     . clopidogrel (PLAVIX) 75 MG tablet Take 75 mg by mouth daily.     Marland Kitchen LORazepam (ATIVAN) 1 MG tablet Take by mouth.    . oxybutynin (DITROPAN) 5 MG tablet Take 5 mg by mouth.     . oxybutynin (DITROPAN-XL) 10 MG 24 hr tablet Take 10 mg by mouth at bedtime.     Marland Kitchen oxyCODONE-acetaminophen (PERCOCET/ROXICET) 5-325 MG tablet Take 1 tablet by mouth every 6 (six) hours as needed.     . pregabalin (LYRICA) 100 MG capsule Take 100 mg by mouth daily.     . traMADol (ULTRAM-ER) 100 MG 24 hr tablet Take 100 mg by mouth daily as needed.     . traZODone (DESYREL) 150 MG tablet Take 150 mg by mouth at bedtime as needed.     . Vitamin D, Ergocalciferol, (DRISDOL) 50000 UNITS CAPS capsule Take 50,000 Units by mouth every 7 (seven) days.       Allergies:  Allergies  Allergen Reactions  . Penicillins Rash    PMH:  Past Medical History  Diagnosis Date  . Hypertension     Fam Hx: History reviewed. No pertinent family history.  Soc Hx:  Social History   Social History  . Marital Status: Widowed    Spouse Name: N/A  . Number of Children: N/A  . Years of Education: N/A   Occupational History  . Not on file.   Social History Main Topics  . Smoking status: Former Research scientist (life sciences)  . Smokeless tobacco: Not on file  . Alcohol Use: No  . Drug Use: No  . Sexual Activity: Not on file   Other Topics Concern  . Not on file   Social History Narrative    PSH: History reviewed. No pertinent past surgical history.. Procedures since admission: ENDOBRONCHIAL  ULTRASOUND  ROS: Review of systems normal other than 12 systems except per HPI.  PHYSICAL EXAM  Vitals: Blood pressure 118/58, pulse 98, temperature 98.6 F (37 C), temperature source Oral, resp. rate  22, height '5\' 6"'$  (1.676 m), weight 72.576 kg (160 lb), SpO2 95 %.. General: Well-developed, Well-nourished in no acute distress Mood: Mood and affect well adjusted, pleasant and cooperative. Orientation: Grossly alert and oriented. Vocal Quality: No hoarseness. Communicates verbally. head and Face: NCAT. No facial asymmetry. No visible skin lesions. No significant facial scars. No tenderness with sinus percussion. Facial strength normal and symmetric. Ears: External ears with normal landmarks, no lesions. External auditory canals free of infection, and are impacted with cerumen. Tympanic membranes cannot be visualized. Hearing: Speech reception grossly normal. Nose: External nose normal with midline dorsum and no lesions or deformity. Nasal Cavity reveals essentially midline septum with normal inferior turbinates. No significant mucosal congestion or erythema. Nasal secretions are minimal and clear. No polyps seen on anterior rhinoscopy. Oral Cavity/ Oropharynx: Lips are normal with no lesions. She is edentulous, which does indeed result in prolapse of the lips and tongue, but there is no evidence of abscess or even any areas of palpable tenderness or swelling. Gingiva healthy with no lesions or gingivitis. Oropharynx including tongue, buccal mucosa, floor of mouth, hard and soft palate, uvula and posterior pharynx free of exudates, erythema or lesions with normal symmetry and hydration.  Indirect Laryngoscopy/Nasopharyngoscopy: Visualization of the larynx, hypopharynx and nasopharynx is not possible in this setting with routine examination. Neck: Supple and symmetric with no palpable masses, tenderness or crepitance. The trachea is midline. Thyroid gland is soft, nontender and symmetric with no masses  or enlargement. Parotid and submandibular glands are soft, nontender and symmetric, without masses. Lymphatic: Cervical lymph nodes are without palpable lymphadenopathy or tenderness. Respiratory: Normal respiratory effort without labored breathing. Cardiovascular: Carotid pulse shows regular rate and rhythm Neurologic: Cranial Nerves II through XII are grossly intact. Eyes: Gaze and Ocular Motility are grossly normal. PERRLA. No visible nystagmus.  MEDICAL DECISION MAKING: Data Review:  Results for orders placed or performed during the hospital encounter of 11/15/14 (from the past 48 hour(s))  Vancomycin, trough     Status: None   Collection Time: 11/18/14  1:28 PM  Result Value Ref Range   Vancomycin Tr 14 10 - 20 ug/mL  Troponin I     Status: None   Collection Time: 11/18/14  4:19 PM  Result Value Ref Range   Troponin I <0.03 <0.031 ng/mL    Comment:        NO INDICATION OF MYOCARDIAL INJURY.   Troponin I     Status: None   Collection Time: 11/18/14  7:33 PM  Result Value Ref Range   Troponin I <0.03 <0.031 ng/mL    Comment:        NO INDICATION OF MYOCARDIAL INJURY.   Rapid HIV screen (HIV 1/2 Ab+Ag)     Status: None   Collection Time: 11/18/14  7:33 PM  Result Value Ref Range   HIV-1 P24 Antigen - HIV24 NON REACTIVE NON REACTIVE   HIV 1/2 Antibodies NON REACTIVE NON REACTIVE   Interpretation (HIV Ag Ab)      A non reactive test result means that HIV 1 or HIV 2 antibodies and HIV 1 p24 antigen were not detected in the specimen.  Troponin I     Status: None   Collection Time: 11/19/14  3:52 AM  Result Value Ref Range   Troponin I <0.03 <0.031 ng/mL    Comment:        NO INDICATION OF MYOCARDIAL INJURY.   Potassium     Status: None   Collection Time: 11/19/14  3:52 AM  Result Value Ref Range   Potassium 3.5 3.5 - 5.1 mmol/L  Magnesium     Status: None   Collection Time: 11/19/14  3:52 AM  Result Value Ref Range   Magnesium 2.2 1.7 - 2.4 mg/dL  . Ct Maxillofacial  W/cm  11/19/2014  CLINICAL DATA:  C/o inside mouth pain with no trauma x two months. Bilateral pain EXAM: CT MAXILLOFACIAL WITH CONTRAST TECHNIQUE: Multidetector CT imaging of the maxillofacial structures was performed with intravenous contrast. Multiplanar CT image reconstructions were also generated. A small metallic BB was placed on the right temple in order to reliably differentiate right from left. CONTRAST:  76m OMNIPAQUE IOHEXOL 350 MG/ML SOLN COMPARISON:  01/26/2014, 03/22/12 FINDINGS: Visualized portions of the brain show right middle cerebral artery territory encephalomalacia as was present previously. No abnormality in the region of the nasopharynx or oropharynx. No mucosal lesions. Major salivary glands are normal bilaterally. No significant submandibular adenopathy. Mild chronic appearing inflammatory change in the bilateral inferior maxillary sinuses. Minimal mucosal periosteal thickening sphenoid sinuses and minimal inflammatory change and the posterior-most left ethmoid air cell. Frontal sinuses are clear. Mastoid air cells are clear bilaterally. There is atherosclerotic calcification of the intracranial internal carotid arteries. There is carotid artery calcification in the neck bilaterally. There is asymmetric soft tissue swelling involving the upper lip on the right. This area measures 5 x 2 cm. There is increased attenuation in the involved soft tissues, with enhancement of the overlying margin of the process just below the skin surface. Underlying maxilla and mandible show no evidence of periostitis. The patient is edentulous except for the sclerotic remnant of an upper left molar. IMPRESSION: Nonspecific potentially infectious or inflammatory soft tissue swelling of the upper lip on the right.This is new from 2014. Electronically Signed   By: RSkipper ClicheM.D.   On: 11/19/2014 09:56  .  ASSESSMENT: The patient has no evidence of any infection or abscess on her lip. She says her lip  protrudes when she does not wear her dentures, and this has been more of an issue since her prior stroke. She is not having any pain in the area this afternoon, though apparently she complained of some discomfort earlier, and there is no fluctuance or tenderness to palpation. CT scan showed no evidence of any abscess. I have reviewed the CT. The area noted is due to overlap of soft tissues, and the area noted by the radiologist is actually contiguous with the tongue which is protruding beyond the mandible (due to lack of dentition to contain the tongue adequately).   PLAN: There is no clinical evidence of abscess or even a cellulitis to correlate with the concerns raised on the CT.    BRiley Nearing MD 11/19/2014 5:42 PM

## 2014-11-19 NOTE — Progress Notes (Addendum)
[facility] [place] NAME: Emily Kidd    MR#:  [id number]  DATE OF BIRTH:  12/16/1950  SUBJECTIVE:  CHIEF COMPLAINT:   Chief Complaint  Patient presents with  . Chest Pain   patient is 64 year old female with history of diagnosis of pneumonia approximately 2 weeks ago who presents to the hospital for shortness of breath, chills and wheezing. She also noted some hemoptysis. In emergency room, she was noted to be hypoxic with O2 sats in 80s. Patient's chest x-ray revealed a left upper lobe pneumonia , CT scan of the chest revealed left upper lobe as well as left lower lobe pneumonia as well as 10 mm cavitary nodule in the right lower lobe . She was admitted for evaluation and treatment. PPD was placed, as well as QuantiFERON test was taken for TB, patient is placed in respiratory isolation. She feels much improved today. No hemoptysis. No shortness of breath. PPD is negative at this time. QuantiFERON gold results will be available in 3 days after test is taken. She complains of less cough or sputum production. Feels good today and wants to go home. Pulmonary he is recommended to wait for QuantiFERON testing. Mycoplasma testing as well as influenza test is negative. Blood cultures are positive for Streptococcus, repeated blood cultures are still pending.  Patient was complaining of some right upper gum area. The and underwent CT scan of maxillofacial area which showed right upper lip swelling, but no obvious abscess was noted. ENT was consulted for recommendations. Patient was seen by Dr. Ola Spurr, who recommended to get bronchoscopy during this admission. Dr.Kasa tentatively scheduled bronchoscopy for Monday afternoon.    Review of Systems  Constitutional: Negative for fever, chills and weight loss.  HENT: Negative for congestion.   Eyes: Negative for blurred vision and double vision.  Respiratory: Negative for cough, sputum production, shortness  of breath and wheezing.   Cardiovascular: Negative for chest pain, palpitations, orthopnea, leg swelling and PND.  Gastrointestinal: Negative for nausea, vomiting, abdominal pain, diarrhea, constipation and blood in stool.  Genitourinary: Negative for dysuria, urgency, frequency and hematuria.  Musculoskeletal: Negative for falls.  Neurological: Negative for dizziness, tremors, focal weakness and headaches.  Endo/Heme/Allergies: Does not bruise/bleed easily.  Psychiatric/Behavioral: Negative for depression. The patient does not have insomnia.     VITAL SIGNS: Blood pressure 118/58, pulse 98, temperature 98.6 F (37 C), temperature source Oral, resp. rate 22, height '5\' 6"'$  (1.676 m), weight 72.576 kg (160 lb), SpO2 95 %.  PHYSICAL EXAMINATION:   GENERAL:  64 y.o.-year-old patient lying in the bed with no acute distress. He was sitting on the bed in bed more than ready to sign out against medical advise.  EYES: Pupils equal, round, reactive to light and accommodation. No scleral icterus. Extraocular muscles intact.  HEENT: Head atraumatic, normocephalic. Oropharynx and nasopharynx clear.  NECK:  Supple, no jugular venous distention. No thyroid enlargement, no tenderness.  LUNGS: Normal breath sounds bilaterally, no wheezing, few rales and rhonchi , no crepitation. No use of accessory muscles of respiration.  CARDIOVASCULAR: S1, S2 normal. No murmurs, rubs, or gallops.  ABDOMEN: Soft, nontender, nondistended. Bowel sounds present. No organomegaly or mass.  EXTREMITIES: No pedal edema, cyanosis, or clubbing.  NEUROLOGIC: Cranial nerves II through XII are intact. Muscle strength 5/5 in all extremities. Sensation intact. Gait not checked.  PSYCHIATRIC: The patient is alert and oriented x 3.  SKIN: No obvious rash, lesion, or ulcer. Right upper gum and  lip palpitation seem to be uncomfortable, but no discrete areas of discomfort when noted on nodularity  ORDERS/RESULTS REVIEWED:   CBC  Recent  Labs Lab 11/15/14 2110  WBC 8.1  HGB 13.1  HCT 39.9  PLT 328  MCV 85.1  MCH 27.8  MCHC 32.7  RDW 15.9*  LYMPHSABS 1.4  MONOABS 0.7  EOSABS 0.3  BASOSABS 0.0   ------------------------------------------------------------------------------------------------------------------  Chemistries   Recent Labs Lab 11/15/14 2110 11/17/14 0435 11/19/14 0352  NA 142  --   --   K 3.2*  --  3.5  CL 109  --   --   CO2 26  --   --   GLUCOSE 94  --   --   BUN 6  --   --   CREATININE 0.67 0.62  --   CALCIUM 8.7*  --   --   MG  --   --  2.2   ------------------------------------------------------------------------------------------------------------------ estimated creatinine clearance is 72.5 mL/min (by C-G formula based on Cr of 0.62). ------------------------------------------------------------------------------------------------------------------ No results for input(s): TSH, T4TOTAL, T3FREE, THYROIDAB in the last 72 hours.  Invalid input(s): FREET3  Cardiac Enzymes  Recent Labs Lab 11/18/14 1619 11/18/14 1933 11/19/14 0352  TROPONINI <0.03 <0.03 <0.03   ------------------------------------------------------------------------------------------------------------------ Invalid input(s): POCBNP ---------------------------------------------------------------------------------------------------------------  RADIOLOGY: Ct Maxillofacial W/cm  11/19/2014  CLINICAL DATA:  C/o inside mouth pain with no trauma x two months. Bilateral pain EXAM: CT MAXILLOFACIAL WITH CONTRAST TECHNIQUE: Multidetector CT imaging of the maxillofacial structures was performed with intravenous contrast. Multiplanar CT image reconstructions were also generated. A small metallic BB was placed on the right temple in order to reliably differentiate right from left. CONTRAST:  1m OMNIPAQUE IOHEXOL 350 MG/ML SOLN COMPARISON:  01/26/2014, 03/22/12 FINDINGS: Visualized portions of the brain show right middle  cerebral artery territory encephalomalacia as was present previously. No abnormality in the region of the nasopharynx or oropharynx. No mucosal lesions. Major salivary glands are normal bilaterally. No significant submandibular adenopathy. Mild chronic appearing inflammatory change in the bilateral inferior maxillary sinuses. Minimal mucosal periosteal thickening sphenoid sinuses and minimal inflammatory change and the posterior-most left ethmoid air cell. Frontal sinuses are clear. Mastoid air cells are clear bilaterally. There is atherosclerotic calcification of the intracranial internal carotid arteries. There is carotid artery calcification in the neck bilaterally. There is asymmetric soft tissue swelling involving the upper lip on the right. This area measures 5 x 2 cm. There is increased attenuation in the involved soft tissues, with enhancement of the overlying margin of the process just below the skin surface. Underlying maxilla and mandible show no evidence of periostitis. The patient is edentulous except for the sclerotic remnant of an upper left molar. IMPRESSION: Nonspecific potentially infectious or inflammatory soft tissue swelling of the upper lip on the right.This is new from 2014. Electronically Signed   By: RSkipper ClicheM.D.   On: 11/19/2014 09:56    EKG:  Orders placed or performed during the hospital encounter of 11/15/14  . EKG 12-Lead  . EKG 12-Lead    ASSESSMENT AND PLAN:  Active Problems:   Pneumonia   COPD (chronic obstructive pulmonary disease) (HCC)   GERD (gastroesophageal reflux disease)   Peripheral neuropathy (HCC)   Peripheral vascular disease (HCC) 1. Streptococcus mitis sepsis .  Continue current antibiotic therapy intravenously. I appreciate infectious disease involvement.  Repeated blood cultures were taken, negative self,  echocardiogram wasn't normal. No valvular abnormalities found. Patient had a right upper gum area pain and CT scan of maxillofacial area  revealed the right upper lip swelling, we will be getting ENT involved for further recommendations,  to rule out abscess.  2 Acute respiratory failure with hypoxia due to pneumonia due to unknown etiologic agent, oxygenation has improved significantly and now patient is on room air and saturating 100% 3. Left upper lobe and left lower lobe bacterial pneumonia of unclear but bacteriologic agent at present, continue broad-spectrum antibiotic therapy with Zithromax and Azactam and vancomycin. Attempted to obtain Sputum cultures. So far unsuccessful. Patient will need bronchoscopy done, pulmonologist recommends on this admission. It is tentatively scheduled for Monday afternoon versus outpatient, will discuss with infectious disease 4. Hypokalemia supplemented IV, normal level,  including magnesium 5. Hemoptysis, appreciate pulmonary consultation, thought to be pneumonia related, QuantiFERON test was negative, questionable MAI infection, Dr. Ola Spurr to reassess if patient needs to have intubation bronchoscopy or could be done as outpatient, discussed with Dr. Mortimer Fries Management plans discussed with the patient, family and they are in agreement.   DRUG ALLERGIES:  Allergies  Allergen Reactions  . Penicillins Rash    CODE STATUS:     Code Status Orders        Start     Ordered   11/16/14 0549  Full code   Continuous     11/16/14 0548    Advance Directive Documentation        Most Recent Value   Type of Advance Directive  Healthcare Power of Attorney   Pre-existing out of facility DNR order (yellow form or pink MOST form)     "MOST" Form in Place?        TOTAL TIME TAKING CARE OF THIS PATIENT: 45 minutes.  Coordination of care 20 minutes Rukiya Hodgkins M.D on 11/19/2014 at 2:35 PM  Between 7am to 6pm - Pager - [phone]  After 6pm go to [email or web address] - password [email or web address]  Office  [phone]  CC: Primary care physician; [place]

## 2014-11-19 NOTE — Plan of Care (Addendum)
Problem: Discharge Progression Outcomes Goal: Other Discharge Outcomes/Goals Plan of care progress to goal: - PRN pain med given with improvement. - Maalox PO given for indigestion with improvement. - Ativan PO given for anxiety with improvement. - Continues ABX. - Uses bedpan with assist. - VSS. - Airborne precautions continues. Will continue to monitor.

## 2014-11-19 NOTE — Consult Note (Signed)
After further review of CT scan and PET scan, will proceed with EBUS/Bronch next week as patient has taken ASA/Plavix.  She must be off these meds for 7 days   Case discussed with Dr. Ether Griffins.  I have personally obtained a history, examined the patient, evaluated Pertinent laboratory and [employer]/imaging results, and  formulated the assessment and plan    Corrin Parker, M.D.  [facility]  Medical Director [place] Director [place] Department

## 2014-11-20 LAB — CREATININE, SERUM
Creatinine, Ser: 0.64 mg/dL (ref 0.44–1.00)
GFR calc Af Amer: >60 mL/min (ref >60)

## 2014-11-20 LAB — VANCOMYCIN, TROUGH: Vancomycin Tr: 22 ug/mL (ref 10–20)

## 2014-11-20 MED ORDER — LORAZEPAM 1 MG PO TABS
1.0000 mg | ORAL_TABLET | Freq: Four times a day (QID) | ORAL | Status: DC | PRN
  Administered 2014-11-20: 19:00:00 1 mg via ORAL
  Filled 2014-11-20: qty 1

## 2014-11-20 NOTE — Progress Notes (Signed)
Notified MD of patients concerns and questions. Dr. Clayton Bibles to come back and talk to patient and mother.

## 2014-11-20 NOTE — Progress Notes (Addendum)
[facility] [place] NAME: Emily Kidd    MR#:  [id number]  DATE OF BIRTH:  16-May-1950  SUBJECTIVE:  CHIEF COMPLAINT:   Chief Complaint  Patient presents with  . Chest Pain   patient is 64 year old female with history of diagnosis of pneumonia approximately 2 weeks ago who presents to the hospital for shortness of breath, chills and wheezing. She also noted some hemoptysis. In emergency room, she was noted to be hypoxic with O2 sats in 80s. Patient's chest x-ray revealed a left upper lobe pneumonia , CT scan of the chest revealed left upper lobe as well as left lower lobe pneumonia as well as 10 mm cavitary nodule in the right lower lobe . She was admitted for evaluation and treatment. PPD was placed, as well as QuantiFERON test was taken for TB, patient is placed in respiratory isolation. She feels much improved today. No hemoptysis. No shortness of breath. PPD is negative at this time. QuantiFERON gold results will be available in 3 days after test is taken. She complains of less cough or sputum production. Feels good today and wants to go home. Pulmonary he is recommended to wait for QuantiFERON testing. Mycoplasma testing as well as influenza test is negative. Blood cultures are positive for Streptococcus, repeated blood cultures are still pending.  Patient was complaining of some right upper gum area. The and underwent CT scan of maxillofacial area which showed right upper lip swelling, but no obvious abscess was noted. ENT was consulted for recommendations. Patient was seen by Dr. Ola Spurr, who recommended to get bronchoscopy during this admission. Dr.Kasa tentatively scheduled bronchoscopy for Monday afternoon.  Today, patient is upset,  requested to be released home as soon as possible. Otherwise, she would sign out [place]. Explained that unfortunately she has infection in her bloodstream and leaving home [place]  with a mean demise. Became extremely excited upset and requested me to leave the  Room.   Review of Systems  Constitutional: Negative for fever, chills and weight loss.  HENT: Negative for congestion.   Eyes: Negative for blurred vision and double vision.  Respiratory: Negative for cough, sputum production, shortness of breath and wheezing.   Cardiovascular: Negative for chest pain, palpitations, orthopnea, leg swelling and PND.  Gastrointestinal: Negative for nausea, vomiting, abdominal pain, diarrhea, constipation and blood in stool.  Genitourinary: Negative for dysuria, urgency, frequency and hematuria.  Musculoskeletal: Negative for falls.  Neurological: Negative for dizziness, tremors, focal weakness and headaches.  Endo/Heme/Allergies: Does not bruise/bleed easily.  Psychiatric/Behavioral: Negative for depression. The patient does not have insomnia.     VITAL SIGNS: Blood pressure 134/75, pulse 104, temperature 98.7 F (37.1 C), temperature source Oral, resp. rate 18, height '5\' 6"'$  (1.676 m), weight 72.576 kg (160 lb), SpO2 93 %.  PHYSICAL EXAMINATION:   GENERAL:  64 y.o.-year-old patient lying in the bed with no acute distress. Restless and agitated  EYES: Pupils equal, round, reactive to light and accommodation. No scleral icterus. Extraocular muscles intact.  HEENT: Head atraumatic, normocephalic. Oropharynx and nasopharynx clear.  NECK:  Supple, no jugular venous distention. No thyroid enlargement, no tenderness.  LUNGS: Normal breath sounds bilaterally, no wheezing, few rales and rhonchi , no crepitation. No use of accessory muscles of respiration.  CARDIOVASCULAR: S1, S2 normal. No murmurs, rubs, or gallops.  ABDOMEN: Soft, nontender, nondistended. Bowel sounds present. No organomegaly or mass.  EXTREMITIES: No pedal edema, cyanosis, or clubbing.  NEUROLOGIC: Cranial  nerves II through XII are intact. Muscle strength 5/5 in all extremities. Sensation intact. Gait not checked.   PSYCHIATRIC: The patient is alert and oriented x 3.  SKIN: No obvious rash, lesion, or ulcer. Right upper gum and lip palpitation seem to be uncomfortable, but no discrete areas of discomfort when noted on nodularity  ORDERS/RESULTS REVIEWED:   CBC  Recent Labs Lab 11/15/14 2110  WBC 8.1  HGB 13.1  HCT 39.9  PLT 328  MCV 85.1  MCH 27.8  MCHC 32.7  RDW 15.9*  LYMPHSABS 1.4  MONOABS 0.7  EOSABS 0.3  BASOSABS 0.0   ------------------------------------------------------------------------------------------------------------------  Chemistries   Recent Labs Lab 11/15/14 2110 11/17/14 0435 11/19/14 0352 11/20/14 0632  NA 142  --   --   --   K 3.2*  --  3.5  --   CL 109  --   --   --   CO2 26  --   --   --   GLUCOSE 94  --   --   --   BUN 6  --   --   --   CREATININE 0.67 0.62  --  0.64  CALCIUM 8.7*  --   --   --   MG  --   --  2.2  --    ------------------------------------------------------------------------------------------------------------------ estimated creatinine clearance is 72.5 mL/min (by C-G formula based on Cr of 0.64). ------------------------------------------------------------------------------------------------------------------ No results for input(s): TSH, T4TOTAL, T3FREE, THYROIDAB in the last 72 hours.  Invalid input(s): FREET3  Cardiac Enzymes  Recent Labs Lab 11/18/14 1619 11/18/14 1933 11/19/14 0352  TROPONINI <0.03 <0.03 <0.03   ------------------------------------------------------------------------------------------------------------------ Invalid input(s): POCBNP ---------------------------------------------------------------------------------------------------------------  RADIOLOGY: Ct Maxillofacial W/cm  11/19/2014  CLINICAL DATA:  C/o inside mouth pain with no trauma x two months. Bilateral pain EXAM: CT MAXILLOFACIAL WITH CONTRAST TECHNIQUE: Multidetector CT imaging of the maxillofacial structures was performed with  intravenous contrast. Multiplanar CT image reconstructions were also generated. A small metallic BB was placed on the right temple in order to reliably differentiate right from left. CONTRAST:  30m OMNIPAQUE IOHEXOL 350 MG/ML SOLN COMPARISON:  01/26/2014, 03/22/12 FINDINGS: Visualized portions of the brain show right middle cerebral artery territory encephalomalacia as was present previously. No abnormality in the region of the nasopharynx or oropharynx. No mucosal lesions. Major salivary glands are normal bilaterally. No significant submandibular adenopathy. Mild chronic appearing inflammatory change in the bilateral inferior maxillary sinuses. Minimal mucosal periosteal thickening sphenoid sinuses and minimal inflammatory change and the posterior-most left ethmoid air cell. Frontal sinuses are clear. Mastoid air cells are clear bilaterally. There is atherosclerotic calcification of the intracranial internal carotid arteries. There is carotid artery calcification in the neck bilaterally. There is asymmetric soft tissue swelling involving the upper lip on the right. This area measures 5 x 2 cm. There is increased attenuation in the involved soft tissues, with enhancement of the overlying margin of the process just below the skin surface. Underlying maxilla and mandible show no evidence of periostitis. The patient is edentulous except for the sclerotic remnant of an upper left molar. IMPRESSION: Nonspecific potentially infectious or inflammatory soft tissue swelling of the upper lip on the right.This is new from 2014. Electronically Signed   By: RSkipper ClicheM.D.   On: 11/19/2014 09:56    EKG:  Orders placed or performed during the hospital encounter of 11/15/14  . EKG 12-Lead  . EKG 12-Lead    ASSESSMENT AND PLAN:  Active Problems:   Pneumonia   COPD (chronic obstructive  pulmonary disease) ([place])   GERD (gastroesophageal reflux disease)   Peripheral neuropathy (HCC)   Peripheral vascular disease  (HCC) 1. Streptococcus mitis sepsis .  Continue current antibiotic therapy intravenously until recommended differently by infectious disease specialist, Dr. Ola Spurr.   Repeated blood cultures are positive for gram-positive cocci again ID to follow,   echocardiogram was normal, may need to get TEE. No valvular abnormalities found on TTE. Patient had a right upper gum area pain and CT scan of maxillofacial area revealed the right upper lip swelling, patient was seen by ENT, Dr. Richardson Landry who did not feel that patient has an abscess. Repeated blood cultures.  2 Acute respiratory failure with hypoxia due to pneumonia due to unknown etiologic agent, oxygenation has improved significantly and now patient is on room air and saturating 100% 3. Left upper lobe and left lower lobe bacterial pneumonia of unclear bacteriologic agent at present, continue broad-spectrum antibiotic therapy with Zithromax and Azactam and vancomycin. Attempted to obtain Sputum cultures. So far unsuccessful. Patient will need bronchoscopy done, pulmonologist recommends on this admission. It is tentatively scheduled for Monday afternoon versus outpatient, will discuss with infectious disease appropriate timing for bronchoscopy.  4. Hypokalemia supplemented IV, normal level,  as well as magnesium 5. Hemoptysis, appreciate pulmonary consultation, thought to be pneumonia related, QuantiFERON test was negative, questionable MAI infection, Dr. Ola Spurr to assess the need for immediate bronchoscopy versus outpatient procedure, discussed with Dr. Mortimer Fries yesterday 6. Agitation, likely due to confinement, patient's nurses were asked to attend patient more frequently and help her with immediate needs, chaplain was consulted. Discussed case with her Management plans discussed with the patient, family and they are in agreement.   DRUG ALLERGIES:  Allergies  Allergen Reactions  . Penicillins Rash    CODE STATUS:     Code Status Orders         Start     Ordered   11/16/14 0549  Full code   Continuous     11/16/14 0548    Advance Directive Documentation        Most Recent Value   Type of Advance Directive  Healthcare Power of Attorney   Pre-existing out of facility DNR order (yellow form or pink MOST form)     "MOST" Form in Place?        TOTAL TIME TAKING CARE OF THIS PATIENT: 60 minutes.   Discussion this patient's mother as well as Dr. Ovid Curd , [facility] physician, coordination of care time 98 minutes  Reniah Cottingham M.D on 11/20/2014 at 11:28 AM  Between 7am to 6pm - Pager - [phone]  After 6pm go to [email or web address] - password [email or web address]  Office  [phone]  CC: Primary care physician; [place]

## 2014-11-20 NOTE — Care Management (Signed)
Spoke with Dr Ovid Curd from [place].  She stated at this time going home on IV antibiotics was not an option.  She is going to discussed with the team and see if moving the patient to [facility] with IV antibiotics, and outpatient bronch is an option.

## 2014-11-20 NOTE — Progress Notes (Signed)
ANTIBIOTIC CONSULT NOTE - FOLLOW UP  Pharmacy Consult for Vancomycin Indication: pneumonia  Allergies  Allergen Reactions  . Penicillins Rash    Patient Measurements: Height: '5\' 6"'$  (167.6 cm) Weight: 160 lb (72.576 kg) IBW/kg (Calculated) : 59.3 Adjusted Body Weight: n/a  Vital Signs: Temp: 98.7 F (37.1 C) (10/20 1444) Temp Source: Oral (10/20 1444) BP: 124/71 mmHg (10/20 1444) Pulse Rate: 102 (10/20 1444) Intake/Output from previous day: 10/19 0701 - 10/20 0700 In: -  Out: 950 [Urine:950] Intake/Output from this shift: Total I/O In: 840 [P.O.:840] Out: -   Labs:  Recent Labs  11/20/14 7782  CREATININE 0.64   Estimated Creatinine Clearance: 72.5 mL/min (by C-G formula based on Cr of 0.64).  Recent Labs  11/18/14 1328 11/20/14 1401  VANCOTROUGH 14 22*     Microbiology: Recent Results (from the past 720 hour(s))  Culture, blood (routine x 2)     Status: None   Collection Time: 11/15/14 11:21 PM  Result Value Ref Range Status   Specimen Description BLOOD RIGHT ARM  Final   Special Requests 2 BLOOD BAA  Final   Culture  Setup Time   Final    GRAM POSITIVE COCCI IN BOTH AEROBIC AND ANAEROBIC BOTTLES CRITICAL RESULT CALLED TO, READ BACK BY AND VERIFIED WITH: CHRIS BENNETT, RN 11/16/2014 AT 1500 BY JRS.    Culture STREPTOCOCCUS MITIS  Final   Report Status 11/18/2014 FINAL  Final   Organism ID, Bacteria STREPTOCOCCUS MITIS  Final      Susceptibility   Streptococcus mitis - MIC*    AMPICILLIN Value in next row Sensitive      SENSITIVE<=0.25    CLINDAMYCIN Value in next row Sensitive      SENSITIVE<=0.25    LEVOFLOXACIN Value in next row Sensitive      SENSITIVE1    ERYTHROMYCIN Value in next row Sensitive      SENSITIVE<=0.12    VANCOMYCIN Value in next row Sensitive      SENSITIVE0.5    * STREPTOCOCCUS MITIS/ORALIS  Culture, blood (routine x 2)     Status: None   Collection Time: 11/15/14 11:21 PM  Result Value Ref Range Status   Specimen  Description BLOOD RIGHT HAND  Final   Special Requests 2 BLOOD BAA  Final   Culture  Setup Time   Final    GRAM POSITIVE COCCI IN BOTH AEROBIC AND ANAEROBIC BOTTLES CRITICAL VALUE NOTED.  VALUE IS CONSISTENT WITH PREVIOUSLY REPORTED AND CALLED VALUE.    Culture   Final    STREPTOCOCCUS MITIS IN BOTH AEROBIC AND ANAEROBIC BOTTLES REFER TO OTHER SET FOR SUSCEPTIBILITIES    Report Status 11/19/2014 FINAL  Final  Culture, blood (routine x 2)     Status: None   Collection Time: 11/16/14  4:32 AM  Result Value Ref Range Status   Report Status 11/18/2014 FINAL  Final  Culture, blood (routine x 2)     Status: None   Collection Time: 11/16/14  4:37 AM  Result Value Ref Range Status   Report Status 11/18/2014 FINAL  Final    Anti-infectives    Start     Dose/Rate Route Frequency Ordered Stop   11/18/14 1600  vancomycin (VANCOCIN) 1,250 mg in sodium chloride 0.9 % 250 mL IVPB     1,250 mg 166.7 mL/hr over 90 Minutes Intravenous Every 12 hours 11/18/14 1537     11/16/14 1400  vancomycin (VANCOCIN) IVPB 1000 mg/200 mL premix  Status:  Discontinued     1,000  mg 200 mL/hr over 60 Minutes Intravenous Every 12 hours 11/16/14 0456 11/18/14 1537   11/16/14 0600  aztreonam (AZACTAM) injection 1 g  Status:  Discontinued     1 g Intramuscular 3 times per day 11/16/14 0431 11/16/14 0443   11/16/14 0600  aztreonam (AZACTAM) 1 g in dextrose 5 % 50 mL IVPB     1 g 100 mL/hr over 30 Minutes Intravenous 3 times per day 11/16/14 0443     11/16/14 0500  azithromycin (ZITHROMAX) 500 mg in dextrose 5 % 250 mL IVPB     500 mg 250 mL/hr over 60 Minutes Intravenous Every 24 hours 11/16/14 0431     11/16/14 0500  vancomycin (VANCOCIN) IVPB 1000 mg/200 mL premix     1,000 mg 200 mL/hr over 60 Minutes Intravenous  Once 11/16/14 0449 11/16/14 0718   11/16/14 0030  doxycycline (VIBRAMYCIN) 100 mg in dextrose 5 % 250 mL IVPB     100 mg 125 mL/hr over 120 Minutes Intravenous  Once 11/16/14 0010 11/16/14 0357       Assessment: Patient currently ordered Vancomycin '1250mg'$  IV q12h. Dose was increased on 10/18 due to trough level slightly subtherapeutic. Patient received contrast dye today so will need to watch renal function closely. SCr is stable.  Goal of Therapy:  Vancomycin trough level 15-20 mcg/ml  Plan:  1020 '@1401'$  VT = 22.  Trough drawn early ~9 hours after the end of the last dose.  Extrapolated true trough is ~17, which is therapeutic.  Will continue with current dose of '1250mg'$  IV Q12H and recheck trough in a couple of days.  Continue to monitor renal function.  Norma Fredrickson, PharmD Clinical Pharmacist  11/20/2014 3:03 PM

## 2014-11-20 NOTE — Progress Notes (Signed)
   11/20/14 1000  Clinical Encounter Type  Visited With Patient  Visit Type Initial  Referral From Chaplain  Consult/Referral To Chaplain  Spiritual Encounters  Spiritual Needs Prayer;Emotional  Stress Factors  Patient Stress Factors Lack of knowledge;Other (Comment)  Received information from on call chaplain that pt requested to speak to a chaplain.  Pt said that a doctor or nurse asked her "why do you want to go home, you're going to die here anyway".  Pt is very upset by this and told me she is discussing this with her attorney and plans to file a complaint with state boards.  Pt stated she was feeling better and was ready to go home until she was told this.  Now she says she is feeling very nervous, scared and has trouble breathing.  I let pt know that she was not alone and that I would try to speak to my supervisor about it and see if there was anything that could be done. I offered pastoral presence, support and prayer.  [place] [phone]

## 2014-11-20 NOTE — Plan of Care (Signed)
Problem: Discharge Progression Outcomes Goal: Other Discharge Outcomes/Goals Outcome: Progressing - PRN pain med given with improvement. - Complained of headache, tylenol given with improvement. - Ativan PO given for anxiety with improvement. - Continues ABX. - Uses bedpan with assist. - VSS. Will continue to monitor.

## 2014-11-20 NOTE — Consult Note (Signed)
[facility] Consultation      Assessment and Plan:  Pneumonia with hemoptysis.  -Review of CT chest shows a dense LUL pneumonia. There is also hilar and subcarinal lymphadenopathy. The patients hemoptysis is likely due to pneumonia while on plavix and aspirin. Hemoptysis has resolved. -TB quantity or on test is negative. -Streptococcal sepsis with positive blood cultures, now stable. -Plan is for the patient to undergo bronchoscopy with endobronchial ultrasound-guided biopsies of the lymph nodes. The patient is now clinically stable and is off oxygen, Plavix is been held for potential biopsy next week, which has been planned. As the patient is now stable, she could potentially be discharged and the procedure could be done on an outpatient basis if desirable.  Mycoplasma IgM Negative.  Viral influenza PCR negative.   Acute hypoxic respiratory failure.  -This has resolved, now stable on room air.  Left lung atelectasis with mediastinal shift. -Due to pneumonia.     Date: 11/20/2014  MRN# [id number] Emily Kidd [birth year]  Referring Physician:   RAJANEE Kidd is a 64 y.o. old female seen in consultation for chief complaint of:    Chief Complaint  Patient presents with  . Chest Pain    HPI:   Alert and awake, no resp distress, hemoptysis resolved, breathing much improved, off oxygen, currently oxygen saturation is 93% on room air.  PMHX:   Past Medical History  Diagnosis Date  . Hypertension    Surgical Hx:  History reviewed. No pertinent past surgical history. Family Hx:  History reviewed. No pertinent family history. Social Hx:   Social History  Substance Use Topics  . Smoking status: Former Research scientist (life sciences)  . Smokeless tobacco: None  . Alcohol Use: No   Medication:   No current outpatient prescriptions on file.    Allergies:  Penicillins  Review of Systems: Gen:  Denies  fever, sweats, chills HEENT: Denies blurred vision, double vision.  Cvc:   No dizziness, chest pain. Resp:   Denies cough or sputum porduction,  Gi: Denies swallowing difficulty, stomach pain. Gu:  Denies bladder incontinence.  Ext:   No Joint pain, stiffness. Skin: No skin rash,  hives Endoc:  No polyuria, polydipsia. Psych: No depression, insomnia. Other:  All other systems were reviewed with the patient and were negative other that what is mentioned in the HPI.   Physical Examination:   VS: BP 134/75 mmHg  Pulse 104  Temp(Src) 98.7 F (37.1 C) (Oral)  Resp 18  Ht '5\' 6"'$  (1.676 m)  Wt 160 lb (72.576 kg)  BMI 25.84 kg/m2  SpO2 93%  General Appearance: No distress  Neuro:without focal findings,  speech normal,  HEENT: PERRLA, EOM intact.   Pulmonary: Left lung crackles.  No wheezing.  CardiovascularNormal S1,S2.  No m/r/g.   Abdomen: Benign, Soft, non-tender. Renal:  No costovertebral tenderness  GU:  No performed at this time. Endoc: No evident thyromegaly, no signs of acromegaly. Skin:   warm, no rashes, no ecchymosis  Extremities: normal, no cyanosis, clubbing.    LABORATORY PANEL:   CBC  Recent Labs Lab 11/15/14 2110  WBC 8.1  HGB 13.1  HCT 39.9  PLT 328   ------------------------------------------------------------------------------------------------------------------  Chemistries   Recent Labs Lab 11/15/14 2110  11/19/14 0352 11/20/14 0632  NA 142  --   --   --   K 3.2*  --  3.5  --   CL 109  --   --   --   CO2 26  --   --   --  GLUCOSE 94  --   --   --   BUN 6  --   --   --   CREATININE 0.67  < >  --  0.64  CALCIUM 8.7*  --   --   --   MG  --   --  2.2  --   < > = values in this interval not displayed. ------------------------------------------------------------------------------------------------------------------  Cardiac Enzymes  Recent Labs Lab 11/19/14 0352  TROPONINI <0.03   ------------------------------------------------------------  RADIOLOGY:  Ct Maxillofacial W/cm  11/19/2014  CLINICAL DATA:   C/o inside mouth pain with no trauma x two months. Bilateral pain EXAM: CT MAXILLOFACIAL WITH CONTRAST TECHNIQUE: Multidetector CT imaging of the maxillofacial structures was performed with intravenous contrast. Multiplanar CT image reconstructions were also generated. A small metallic BB was placed on the right temple in order to reliably differentiate right from left. CONTRAST:  17m OMNIPAQUE IOHEXOL 350 MG/ML SOLN COMPARISON:  01/26/2014, 03/22/12 FINDINGS: Visualized portions of the brain show right middle cerebral artery territory encephalomalacia as was present previously. No abnormality in the region of the nasopharynx or oropharynx. No mucosal lesions. Major salivary glands are normal bilaterally. No significant submandibular adenopathy. Mild chronic appearing inflammatory change in the bilateral inferior maxillary sinuses. Minimal mucosal periosteal thickening sphenoid sinuses and minimal inflammatory change and the posterior-most left ethmoid air cell. Frontal sinuses are clear. Mastoid air cells are clear bilaterally. There is atherosclerotic calcification of the intracranial internal carotid arteries. There is carotid artery calcification in the neck bilaterally. There is asymmetric soft tissue swelling involving the upper lip on the right. This area measures 5 x 2 cm. There is increased attenuation in the involved soft tissues, with enhancement of the overlying margin of the process just below the skin surface. Underlying maxilla and mandible show no evidence of periostitis. The patient is edentulous except for the sclerotic remnant of an upper left molar. IMPRESSION: Nonspecific potentially infectious or inflammatory soft tissue swelling of the upper lip on the right.This is new from 2014. Electronically Signed   By: RSkipper ClicheM.D.   On: 11/19/2014 09:56      Deep RAshby Dawes M.D.

## 2014-11-20 NOTE — Plan of Care (Signed)
Problem: Discharge Progression Outcomes Goal: Other Discharge Outcomes/Goals Outcome: Progressing Plan of care progress to goal: IV ABX infusing. Tolerating diet well. C/o pain in head. Lorazepam given PRN for anxiety. Pts mother stayed all day. Calling out for needs.  Plan to d/c home with [facility] program at discharge.

## 2014-11-21 ENCOUNTER — Ambulatory Visit: Payer: Self-pay

## 2014-11-21 DIAGNOSIS — A419 Sepsis, unspecified organism: Secondary | ICD-10-CM

## 2014-11-21 DIAGNOSIS — J9601 Acute respiratory failure with hypoxia: Secondary | ICD-10-CM

## 2014-11-21 DIAGNOSIS — G9341 Metabolic encephalopathy: Secondary | ICD-10-CM

## 2014-11-21 DIAGNOSIS — J181 Lobar pneumonia, unspecified organism: Secondary | ICD-10-CM

## 2014-11-21 DIAGNOSIS — J189 Pneumonia, unspecified organism: Secondary | ICD-10-CM

## 2014-11-21 DIAGNOSIS — R042 Hemoptysis: Secondary | ICD-10-CM

## 2014-11-21 MED ORDER — ENSURE ENLIVE PO LIQD: 237.0000 mL | Freq: Three times a day (TID) | ORAL | Status: DC

## 2014-11-21 MED ORDER — CLINDAMYCIN HCL 300 MG PO CAPS: 300.0000 mg | ORAL_CAPSULE | Freq: Three times a day (TID) | ORAL | Status: DC

## 2014-11-21 NOTE — Discharge Instructions (Signed)
Community-Acquired Pneumonia, Adult Pneumonia is an infection of the lungs. One type of pneumonia can happen while a person is in a hospital. A different type can happen when a person is not in a hospital (community-acquired pneumonia). It is easy for this kind to spread from person to person. It can spread to you if you breathe near an infected person who coughs or sneezes. Some symptoms include:  A dry cough.  A wet (productive) cough.  Fever.  Sweating.  Chest pain. HOME CARE  Take over-the-counter and prescription medicines only as told by your doctor.  Only take cough medicine if you are losing sleep.  If you were prescribed an antibiotic medicine, take it as told by your doctor. Do not stop taking the antibiotic even if you start to feel better.  Sleep with your head and neck raised (elevated). You can do this by putting a few pillows under your head, or you can sleep in a recliner.  Do not use tobacco products. These include cigarettes, chewing tobacco, and e-cigarettes. If you need help quitting, ask your doctor.  Drink enough water to keep your pee (urine) clear or pale yellow. A shot (vaccine) can help prevent pneumonia. Shots are often suggested for:  People older than 64 years of age.  People older than 64 years of age:  Who are having cancer treatment.  Who have long-term (chronic) lung disease.  Who have problems with their body's defense system (immune system). You may also prevent pneumonia if you take these actions:  Get the flu (influenza) shot every year.  Go to the dentist as often as told.  Wash your hands often. If soap and water are not available, use hand sanitizer. GET HELP IF:  You have a fever.  You lose sleep because your cough medicine does not help. GET HELP RIGHT AWAY IF:  You are short of breath and it gets worse.  You have more chest pain.  Your sickness gets worse. This is very serious if:  You are an older adult.  Your  body's defense system is weak.  You cough up blood.   This information is not intended to replace advice given to you by your health care provider. Make sure you discuss any questions you have with your health care provider.   Document Released: 07/06/2007 Document Revised: 10/08/2014 Document Reviewed: 05/14/2014 Elsevier Interactive Patient Education 2016 [employer].  Bacteremia Bacteremia is the presence of bacteria in the blood. A small amount of bacteria may not cause any symptoms. Sometimes, the bacteria spread and cause infection in other parts of the body, such as the heart, joints, bones, or brain. Having a great amount of bacteria can cause a serious, sometimes life-threatening infection called sepsis. CAUSES This condition is caused by bacteria that get into the blood. Bacteria can enter the blood:  During a dental or medical procedure.  After you brush your teeth so hard that the gums bleed.  Through a scrape or cut on your skin. More severe types of bacteremia can be caused by:  A bacterial infection, such as pneumonia, that spreads to the blood.  Using a dirty needle. RISK FACTORS This condition is more likely to develop in:  Children and elderly adults.  People who have a long-lasting (chronic) disease or medical condition.  People who have an artificial joint or heart valve.  People who have heart valve disease.  People who have a tube, such as a catheter or IV tube, that has been inserted for  a medical treatment.  People who have a weak body defense system (immune system).  People who use IV drugs. SYMPTOMS Usually, this condition does not cause symptoms when it is mild. When it is more serious, it may cause:  Fever.  Chills.  Racing heart.  Shortness of breath.  Dizziness.  Weakness.  Confusion.  Nausea or vomiting.  Diarrhea. Bacteremia that has spread to other parts of the body may cause symptoms in those areas. DIAGNOSIS This  condition may be diagnosed with a physical exam and tests, such as:  A complete blood count (CBC). This test looks for signs of infection.  Blood cultures. These look for bacteria in your blood.  Tests of any IV tubes. These look for a source of infection.  Urine tests.  Imaging tests, such as an X-ray, CT scan, MRI, or heart ultrasound. TREATMENT If the condition is mild, treatment is usually not needed. Usually, the body's immune system will remove the bacteria. If the condition is more serious, it may be treated with:  Antibiotic medicines through an IV tube. These may be given for about 2 weeks. At first, the antibiotic that is given may kill most types of blood bacteria. If your test results show that a certain kind of bacteria is causing problems, the antibiotic may be changed to kill only the bacteria that are causing problems.  Antibiotics taken by mouth.  Removing any catheter or IV tube that is a source of infection.  Blood pressure and breathing support, if needed.  Surgery to control the source or spread of infection, if needed. HOME CARE INSTRUCTIONS  Take over-the-counter and prescription medicines only as told by your health care provider.  If you were prescribed an antibiotic, take it as told by your health care provider. Do not stop taking the antibiotic even if you start to feel better.  Rest at home until your condition is under control.  Drink enough fluid to keep your urine clear or pale yellow.  Keep all follow-up visits as told by your health care provider. This is important. PREVENTION Take these actions to help prevent future episodes of bacteremia:  Get all vaccinations as recommended by your health care provider.  Clean and cover scrapes or cuts.  Bathe regularly.  Wash your hands often.  Before any dental or surgical procedure, ask your health care provider if you should take an antibiotic. SEEK MEDICAL CARE IF:  Your symptoms get  worse.  You continue to have symptoms after treatment.  You develop new symptoms after treatment. SEEK IMMEDIATE MEDICAL CARE IF:  You have chest pain or trouble breathing.  You develop confusion, dizziness, or weakness.  You develop pale skin.   This information is not intended to replace advice given to you by your health care provider. Make sure you discuss any questions you have with your health care provider.   Document Released: 10/31/2005 Document Revised: 10/08/2014 Document Reviewed: 03/22/2014 Elsevier Interactive Patient Education [employer].

## 2014-11-21 NOTE — Discharge Planning (Signed)
Pt discharged with [facility]. IV removed and pt was dressed. Vss. Discharge paper work given to her.

## 2014-11-21 NOTE — Progress Notes (Signed)
Initial Nutrition Assessment   INTERVENTION:   Meals and Snacks: Cater to patient preferences; will send meats chopped with gravy as pt reports tolerating better Medical Food Supplement Therapy: will recommend Ensure Enlive po TID, each supplement provides 350 kcal and 20 grams of protein   NUTRITION DIAGNOSIS:   Inadequate oral intake related to poor appetite as evidenced by per patient/family report.  GOAL:   Patient will meet greater than or equal to 90% of their needs  MONITOR:    (Energy Intake, Electrolyte and Renal Profile, Digestive System, Anthropometrics, Pulmonary Profile)  REASON FOR ASSESSMENT:   LOS    ASSESSMENT:   Pt admitted with CP and LUL pna. Pt on airborne isolation during admission to r/o TB. TB negative per MD note. Pt with h/o lung mass, per MD note plan to undergo bronchoscopy with biopsy of lymph nodes likely outpatient. Pt to be discharged today per MD note.  Past Medical History  Diagnosis Date  . Hypertension     Diet Order:  Diet Heart Room service appropriate?: Yes; Fluid consistency:: Thin Diet - low sodium heart healthy    Current Nutrition: Pt reports eating 100% of breakfast this am. Recorded po intake limited when pt on airborne isolation. Pt ate 100% of all meals yesterday. Pt reports appetite is doing much better.   Food/Nutrition-Related History: Pt reports poor appetite for the past 2 weeks PTA. Pt likes vanilla ensure. Pt reports having some difficulty swallowing, reports with food not usually with liquids.   Scheduled Medications:  . amLODipine  5 mg Oral Daily   And  . atorvastatin  20 mg Oral q1800  . azithromycin  500 mg Intravenous Q24H  . aztreonam  1 g Intravenous 3 times per day  . citalopram  20 mg Oral Daily  . feeding supplement (ENSURE ENLIVE)  237 mL Oral TID WC  . guaiFENesin  600 mg Oral BID  . lidocaine  1 application Topical Once  . oxybutynin  10 mg Oral QHS  . pregabalin  100 mg Oral Daily  .  vancomycin  1,250 mg Intravenous Q12H     Electrolyte/Renal Profile and Glucose Profile:   Recent Labs Lab 11/15/14 2110 11/17/14 0435 11/19/14 0352 11/20/14 0632  NA 142  --   --   --   K 3.2*  --  3.5  --   CL 109  --   --   --   CO2 26  --   --   --   BUN 6  --   --   --   CREATININE 0.67 0.62  --  0.64  CALCIUM 8.7*  --   --   --   MG  --   --  2.2  --   GLUCOSE 94  --   --   --    Protein Profile: No results for input(s): ALBUMIN in the last 168 hours.  Gastrointestinal Profile: Last BM:  11/20/2014   Nutrition-Focused Physical Exam Findings: Nutrition-Focused physical exam completed. Findings are WDL for fat depletion, muscle depletion, and edema.    Weight Change: Pt reports stable weight PTA. RD weighed pt on visit 163.6lbs.   Skin:  Reviewed, no issues  Height:   Ht Readings from Last 1 Encounters:  11/21/14 '5\' 6"'$  (1.676 m)    Weight:   Wt Readings from Last 1 Encounters:  11/21/14 163 lb 9.6 oz (74.208 kg)     BMI:  Body mass index is 26.42 kg/(m^2).   EDUCATION NEEDS:  Education needs no appropriate at this time   [place], [place], LDN Pager [phone]

## 2014-11-21 NOTE — Progress Notes (Signed)
ID E note Reviewed CT, ENT notes. At this point needs bronch - can be done as otpt.  For abx for Strep Mitis- PCN allergic -  I would give 2 weeks of Clindamycin oral 300 TID at this time and fu me in 2 weeks Will repeat bcx off abx to ensure clearance. May need TEE if persistent.

## 2014-11-21 NOTE — Discharge Summary (Signed)
[facility] NAME: Emily Kidd    MR#:  [id number]  DATE OF BIRTH:  Dec 27, 1950  DATE OF ADMISSION:  11/15/2014 ADMITTING PHYSICIAN: Quintella Baton, MD  DATE OF DISCHARGE: 11/21/2014  1:42 PM  PRIMARY CARE PHYSICIAN: [place]     ADMISSION DIAGNOSIS:  Community acquired pneumonia [J18.9] Hypoxia [R09.02]  DISCHARGE DIAGNOSIS:  Principal Problem:   Sepsis ([place]) Active Problems:   Acute respiratory failure with hypoxia ([place])   Left upper lobe pneumonia   Pneumonia   COPD (chronic obstructive pulmonary disease) (HCC)   Encephalopathy, metabolic   Hemoptysis   GERD (gastroesophageal reflux disease)   Peripheral neuropathy (HCC)   Peripheral vascular disease ([place])   SECONDARY DIAGNOSIS:   Past Medical History  Diagnosis Date  . Hypertension     .pro HOSPITAL COURSE:  The patient is 64 year old female with history of diagnosis of pneumonia approximately 2 weeks ago who presents to the hospital for shortness of breath, chills and wheezing. She also noted some hemoptysis. In emergency room, she was noted to be hypoxic with O2 sats in 80s. Patient's chest x-ray revealed a left upper lobe pneumonia , CT scan of the chest revealed left upper lobe as well as left lower lobe pneumonia as well as 10 mm cavitary nodule in the right lower lobe . She was admitted for evaluation and treatment. PPD was placed, as well as QuantiFERON test was taken for TB, patient is placed in respiratory isolation.  PPD was negative and QuantiFERON gold was negative. Patient was treated with broad-spectrum antibiotic therapy and her condition improved. Patient was seen by pulmonologist as well as infectious disease specialist, who recommended,  in view of hemoptysis and scarring in upper lobes,  to have bronchoscopy done as outpatient.  Mycoplasma testing as well as influenza test is negative. Blood cultures taken on admission, where  positive for Streptococcus, repeated blood cultures were negative. Echocardiogram, transthoracic was unremarkable. In order to better investigate bacteremia and because of patient's right upper gum area pain on palpation she underwent CT scanning of maxillofacial area which showed right upper lip swelling, but no obvious abscess. ENT was consulted , but it was not felt that patient had an abscess. Dr. Ola Spurr, infectious disease specialist recommended to initiate patient on clindamycin at 300 mg 3 times daily dose to be continued for 14 days and follow-up with him as outpatient in 1-2 weeks after discharge. Bronchoscopy as outpatient is arranged for Monday 24th of October 2016, to be performed by Dr. Mortimer Fries.   Discussion by problem  1. Streptococcus mitis sepsis, unclear source . Initial blood cultures taken 11/15/2014 were positive for Streptococcus, but repeated blood cultures 16 of  October where negative.  Dr. Ola Spurr recommends to continue patient on clindamycin for 14 days and follow-up with him as outpatient. Transthoracic echocardiogram was normal.  Patient had a right upper gum area pain and CT scan of maxillofacial area revealed the right upper lip swelling, patient was seen by ENT, Dr. Richardson Landry who did not feel that patient has an abscess. At this point source remains unclear.   2 Acute respiratory failure with hypoxia due to pneumonia due to unknown etiologic agent, oxygenation has improved significantly and now patient is on room air and saturating 100% 3. Left upper lobe and left lower lobe bacterial pneumonia of unclear bacteriologic agent, patient received 6 day therapy with broad-spectrum antibiotic therapy with Zithromax and Azactam and vancomycin, IV recommends to continue  antibiotics with clindamycin at 300 mg 3 times daily dose for 14 days. Attempted to obtain Sputum cultures,  unsuccessful. Patient will undergo bronchoscopy as outpatient, scheduled 24th of October 2016.  Pulmonologist was informed about outpatient procedure. 4. Hypokalemia supplemented IV, normal level, as well as magnesium 5. Hemoptysis, she was seen by pulmonary and ID , and although her QuantiFERON test was negative, there was concern of possible MAI infection, patient is scheduled for outpatient bronchoscopy as above   DISCHARGE CONDITIONS:   Stable  CONSULTS OBTAINED:  Treatment Team:  Adrian Prows, MD Clyde Canterbury, MD  DRUG ALLERGIES:   Allergies  Allergen Reactions  . Penicillins Rash    DISCHARGE MEDICATIONS:   Discharge Medication List as of 11/21/2014 10:26 AM    START taking these medications   Details  clindamycin (CLEOCIN) 300 MG capsule Take 1 capsule (300 mg total) by mouth 3 (three) times daily., Starting 11/21/2014, Until Discontinued, Normal      CONTINUE these medications which have NOT CHANGED   Details  acetaminophen (TYLENOL) 325 MG tablet Take 1 tablet by mouth every 4 (four) hours as needed., Until Discontinued, Historical Med    albuterol (PROAIR HFA) 108 (90 BASE) MCG/ACT inhaler Inhale into the lungs., Until Discontinued, Historical Med    albuterol (PROVENTIL) (2.5 MG/3ML) 0.083% nebulizer solution Inhale into the lungs., Until Discontinued, Historical Med    amLODipine-atorvastatin (CADUET) 5-20 MG tablet Take 1 tablet by mouth daily. , Until Discontinued, Historical Med    celecoxib (CELEBREX) 200 MG capsule Take 200 mg by mouth daily. , Until Discontinued, Historical Med    citalopram (CELEXA) 20 MG tablet Take 20 mg by mouth daily. , Until Discontinued, Historical Med    LORazepam (ATIVAN) 1 MG tablet Take by mouth., Until Discontinued, Historical Med    oxybutynin (DITROPAN) 5 MG tablet Take 5 mg by mouth. , Until Discontinued, Historical Med    oxybutynin (DITROPAN-XL) 10 MG 24 hr tablet Take 10 mg by mouth at bedtime. , Until Discontinued, Historical Med    oxyCODONE-acetaminophen (PERCOCET/ROXICET) 5-325 MG tablet Take 1  tablet by mouth every 6 (six) hours as needed. , Until Discontinued, Historical Med    pregabalin (LYRICA) 100 MG capsule Take 100 mg by mouth daily. , Until Discontinued, Historical Med    traMADol (ULTRAM-ER) 100 MG 24 hr tablet Take 100 mg by mouth daily as needed. , Until Discontinued, Historical Med    traZODone (DESYREL) 150 MG tablet Take 150 mg by mouth at bedtime as needed. , Until Discontinued, Historical Med    Vitamin D, Ergocalciferol, (DRISDOL) 50000 UNITS CAPS capsule Take 50,000 Units by mouth every 7 (seven) days. , Until Discontinued, Historical Med      STOP taking these medications     aspirin EC 81 MG tablet      clopidogrel (PLAVIX) 75 MG tablet          DISCHARGE INSTRUCTIONS:    Patient is to follow-up with her primary care physician, pulmonologist, infectious disease specialist  If you experience worsening of your admission symptoms, develop shortness of breath, life threatening emergency, suicidal or homicidal thoughts you must seek medical attention immediately by calling 911 or calling your MD immediately  if symptoms less severe.  You Must read complete instructions/literature along with all the possible adverse reactions/side effects for all the Medicines you take and that have been prescribed to you. Take any new Medicines after you have completely understood and accept all the possible adverse reactions/side effects.  Please note  You were cared for by a hospitalist during your hospital stay. If you have any questions about your discharge medications or the care you received while you were in the hospital after you are discharged, you can call the unit and asked to speak with the hospitalist on call if the hospitalist that took care of you is not available. Once you are discharged, your primary care physician will handle any further medical issues. Please note that NO REFILLS for any discharge medications will be authorized once you are discharged, as  it is imperative that you return to your primary care physician (or establish a relationship with a primary care physician if you do not have one) for your aftercare needs so that they can reassess your need for medications and monitor your lab values.    Today   CHIEF COMPLAINT:   Chief Complaint  Patient presents with  . Chest Pain    HISTORY OF PRESENT ILLNESS:  Yanira Tolsma  is a 64 y.o. female with a known history of left AKA, hypertension, hyperlipidemia, asthma/COPD, peripheral vascular disease with history of diagnosis of pneumonia approximately 2 weeks ago who presents to the hospital for shortness of breath, chills and wheezing. She also noted some hemoptysis. In emergency room, she was noted to be hypoxic with O2 sats in 80s. Patient's chest x-ray revealed a left upper lobe pneumonia , CT scan of the chest revealed left upper lobe as well as left lower lobe pneumonia as well as 10 mm cavitary nodule in the right lower lobe . She was admitted for evaluation and treatment. PPD was placed, as well as QuantiFERON test was taken for TB, patient is placed in respiratory isolation.  PPD was negative and QuantiFERON gold was negative. Patient was treated with broad-spectrum antibiotic therapy and her condition improved. Patient was seen by pulmonologist as well as infectious disease specialist, who recommended,  in view of hemoptysis and scarring in upper lobes,  to have bronchoscopy done as outpatient.  Mycoplasma testing as well as influenza test is negative. Blood cultures taken on admission, where positive for Streptococcus, repeated blood cultures were negative. Echocardiogram, transthoracic was unremarkable. In order to better investigate bacteremia and because of patient's right upper gum area pain on palpation she underwent CT scanning of maxillofacial area which showed right upper lip swelling, but no obvious abscess. ENT was consulted , but it was not felt that patient had an abscess. Dr.  Ola Spurr, infectious disease specialist recommended to initiate patient on clindamycin at 300 mg 3 times daily dose to be continued for 14 days and follow-up with him as outpatient in 1-2 weeks after discharge. Bronchoscopy as outpatient is arranged for Monday 24th of October 2016, to be performed by Dr. Mortimer Fries.   Discussion by problem  1. Streptococcus mitis sepsis, unclear source . Initial blood cultures taken 11/15/2014 were positive for Streptococcus, but repeated blood cultures 16 of  October where negative.  Dr. Ola Spurr recommends to continue patient on clindamycin for 14 days and follow-up with him as outpatient. Transthoracic echocardiogram was normal.  Patient had a right upper gum area pain and CT scan of maxillofacial area revealed the right upper lip swelling, patient was seen by ENT, Dr. Richardson Landry who did not feel that patient has an abscess. At this point source remains unclear.   2 Acute respiratory failure with hypoxia due to pneumonia due to unknown etiologic agent, oxygenation has improved significantly and now patient is on room air and saturating 100% 3.  Left upper lobe and left lower lobe bacterial pneumonia of unclear bacteriologic agent, patient received 6 day therapy with broad-spectrum antibiotic therapy with Zithromax and Azactam and vancomycin, IV recommends to continue antibiotics with clindamycin at 300 mg 3 times daily dose for 14 days. Attempted to obtain Sputum cultures,  unsuccessful. Patient will undergo bronchoscopy as outpatient, scheduled 24th of October 2016. Pulmonologist was informed about outpatient procedure. 4. Hypokalemia supplemented IV, normal level, as well as magnesium 5. Hemoptysis, she was seen by pulmonary and ID , and although her QuantiFERON test was negative, there was concern of possible MAI infection, patient is scheduled for outpatient bronchoscopy as above      VITAL SIGNS:  Blood pressure 122/65, pulse 106, temperature 98.9 F (37.2  C), temperature source Oral, resp. rate 18, height '5\' 6"'$  (1.676 m), weight 74.208 kg (163 lb 9.6 oz), SpO2 92 %.  I/O:   Intake/Output Summary (Last 24 hours) at 11/21/14 1434 Last data filed at 11/21/14 0506  Gross per 24 hour  Intake    910 ml  Output      0 ml  Net    910 ml    PHYSICAL EXAMINATION:  GENERAL:  64 y.o.-year-old patient lying in the bed with no acute distress.  EYES: Pupils equal, round, reactive to light and accommodation. No scleral icterus. Extraocular muscles intact.  HEENT: Head atraumatic, normocephalic. Oropharynx and nasopharynx clear.  NECK:  Supple, no jugular venous distention. No thyroid enlargement, no tenderness.  LUNGS: Normal breath sounds bilaterally, no wheezing, rales,rhonchi or crepitation. No use of accessory muscles of respiration.  CARDIOVASCULAR: S1, S2 normal. No murmurs, rubs, or gallops.  ABDOMEN: Soft, non-tender, non-distended. Bowel sounds present. No organomegaly or mass.  EXTREMITIES: No pedal edema, cyanosis, or clubbing.  NEUROLOGIC: Cranial nerves II through XII are intact. Muscle strength 5/5 in all extremities. Sensation intact. Gait not checked.  PSYCHIATRIC: The patient is alert and oriented x 3.  SKIN: No obvious rash, lesion, or ulcer.   DATA REVIEW:   CBC  Recent Labs Lab 11/15/14 2110  WBC 8.1  HGB 13.1  HCT 39.9  PLT 328    Chemistries   Recent Labs Lab 11/15/14 2110  11/19/14 0352 11/20/14 0632  NA 142  --   --   --   K 3.2*  --  3.5  --   CL 109  --   --   --   CO2 26  --   --   --   GLUCOSE 94  --   --   --   BUN 6  --   --   --   CREATININE 0.67  < >  --  0.64  CALCIUM 8.7*  --   --   --   MG  --   --  2.2  --   < > = values in this interval not displayed.  Cardiac Enzymes  Recent Labs Lab 11/19/14 0352  TROPONINI <0.03    Microbiology Results  Results for orders placed or performed during the hospital encounter of 11/15/14  Culture, blood (routine x 2)     Status: None   Collection  Time: 11/15/14 11:21 PM  Result Value Ref Range Status   Specimen Description BLOOD RIGHT ARM  Final   Special Requests 2 BLOOD BAA  Final   Culture  Setup Time   Final    GRAM POSITIVE COCCI IN BOTH AEROBIC AND ANAEROBIC BOTTLES CRITICAL RESULT CALLED TO, READ BACK BY AND VERIFIED WITH:  CHRIS BENNETT, RN 11/16/2014 AT 1500 BY JRS.    Culture STREPTOCOCCUS MITIS  Final   Report Status 11/18/2014 FINAL  Final   Organism ID, Bacteria STREPTOCOCCUS MITIS  Final      Susceptibility   Streptococcus mitis - MIC*    AMPICILLIN Value in next row Sensitive      SENSITIVE<=0.25    CLINDAMYCIN Value in next row Sensitive      SENSITIVE<=0.25    LEVOFLOXACIN Value in next row Sensitive      SENSITIVE1    ERYTHROMYCIN Value in next row Sensitive      SENSITIVE<=0.12    VANCOMYCIN Value in next row Sensitive      SENSITIVE0.5    * STREPTOCOCCUS MITIS/ORALIS  Culture, blood (routine x 2)     Status: None   Collection Time: 11/15/14 11:21 PM  Result Value Ref Range Status   Specimen Description BLOOD RIGHT HAND  Final   Special Requests 2 BLOOD BAA  Final   Culture  Setup Time   Final    GRAM POSITIVE COCCI IN BOTH AEROBIC AND ANAEROBIC BOTTLES CRITICAL VALUE NOTED.  VALUE IS CONSISTENT WITH PREVIOUSLY REPORTED AND CALLED VALUE.    Culture   Final    STREPTOCOCCUS MITIS IN BOTH AEROBIC AND ANAEROBIC BOTTLES REFER TO OTHER SET FOR SUSCEPTIBILITIES    Report Status 11/19/2014 FINAL  Final  Culture, blood (routine x 2)     Status: None   Collection Time: 11/16/14  4:32 AM  Result Value Ref Range Status   Report Status 11/18/2014 FINAL  Final  Culture, blood (routine x 2)     Status: None   Collection Time: 11/16/14  4:37 AM  Result Value Ref Range Status   Report Status 11/18/2014 FINAL  Final    RADIOLOGY:  No results found.  EKG:   Orders placed or performed during the hospital encounter of 11/15/14  . EKG 12-Lead  . EKG 12-Lead      Management plans discussed with the  patient, family and they are in agreement.  CODE STATUS:     Code Status Orders        Start     Ordered   11/16/14 0549  Full code   Continuous     11/16/14 0548    Advance Directive Documentation        Most Recent Value   Type of Advance Directive  Healthcare Power of Attorney   Pre-existing out of facility DNR order (yellow form or pink MOST form)     "MOST" Form in Place?        TOTAL TIME TAKING CARE OF THIS PATIENT: 40 minutes.    Theodoro Grist M.D on 11/21/2014 at 2:34 PM  Between 7am to 6pm - Pager - [phone]  After 6pm go to [email or web address] - password [email or web address] [facility]  Office  [phone]  CC: Primary care physician; [place]

## 2014-11-21 NOTE — Plan of Care (Signed)
Problem: Discharge Progression Outcomes Goal: Other Discharge Outcomes/Goals Outcome: Progressing Plan of Care Progress to Goal:   Pt report pain x2 during shift and nausea x1. Pt has been resting comfortably during shift. Pt mother is at the bedside. Pt wanted to be placed on oxygen for comfort. No other signs of distress noted. Will continue to monitor.

## 2014-11-21 NOTE — Care Management Important Message (Signed)
Important Message  Patient Details  Name: VON INSCOE MRN: [id number] Date of Birth: 1950-12-27   Medicare Important Message Given:  Yes-fourth notification given    Juliann Pulse A Allmond 11/21/2014, 11:19 AM

## 2014-11-21 NOTE — Care Management (Signed)
Patient to be discharge today on oral antibiotics, with follow up bronch on Monday.  Left message for [facility] with updated plan

## 2014-11-24 ENCOUNTER — Encounter: Payer: Self-pay | Admitting: Emergency Medicine

## 2014-11-24 ENCOUNTER — Ambulatory Visit: Admit: 2014-11-24 | Payer: Self-pay | Admitting: Internal Medicine

## 2014-11-24 ENCOUNTER — Encounter: Payer: Self-pay | Admitting: Anesthesiology

## 2014-11-24 ENCOUNTER — Emergency Department: Payer: Medicare (Managed Care)

## 2014-11-24 ENCOUNTER — Encounter
Admission: EM | Disposition: A | Discharge status: Still In Hospital | Payer: Self-pay | Source: Ambulatory Visit | Attending: Internal Medicine

## 2014-11-24 ENCOUNTER — Emergency Department
Admission: EM | Admit: 2014-11-24 | Discharge: 2014-11-24 | Disposition: A | Discharge status: Home / Self Care | Payer: Medicare (Managed Care) | Attending: Emergency Medicine | Admitting: Emergency Medicine

## 2014-11-24 ENCOUNTER — Ambulatory Visit
Admission: EM | Admit: 2014-11-24 | Discharge: 2014-11-24 | Disposition: A | Discharge status: Still In Hospital | Payer: Medicare (Managed Care) | Source: Ambulatory Visit | Attending: Internal Medicine | Admitting: Internal Medicine

## 2014-11-24 DIAGNOSIS — R079 Chest pain, unspecified: Secondary | ICD-10-CM | POA: Diagnosis not present

## 2014-11-24 DIAGNOSIS — Z79899 Other long term (current) drug therapy: Secondary | ICD-10-CM | POA: Diagnosis not present

## 2014-11-24 DIAGNOSIS — I10 Essential (primary) hypertension: Secondary | ICD-10-CM | POA: Insufficient documentation

## 2014-11-24 DIAGNOSIS — J449 Chronic obstructive pulmonary disease, unspecified: Secondary | ICD-10-CM | POA: Diagnosis not present

## 2014-11-24 HISTORY — DX: Chronic obstructive pulmonary disease, unspecified: J44.9

## 2014-11-24 HISTORY — PX: ENDOBRONCHIAL ULTRASOUND: SHX5096

## 2014-11-24 LAB — BASIC METABOLIC PANEL
ANION GAP: 10 (ref 5–15)
BUN: 7 mg/dL (ref 6–20)
CHLORIDE: 108 mmol/L (ref 101–111)
CO2: 23 mmol/L (ref 22–32)
Calcium: 9 mg/dL (ref 8.9–10.3)
Creatinine, Ser: 0.71 mg/dL (ref 0.44–1.00)
GFR calc Af Amer: >60 mL/min (ref >60)
GFR calc non Af Amer: >60 mL/min (ref >60)
Glucose, Bld: 92 mg/dL (ref 65–99)
POTASSIUM: 3.5 mmol/L (ref 3.5–5.1)
SODIUM: 141 mmol/L (ref 135–145)

## 2014-11-24 LAB — CBC
HEMATOCRIT: 36.6 % (ref 35.0–47.0)
HEMOGLOBIN: 12.5 g/dL (ref 12.0–16.0)
MCH: 29.2 pg (ref 26.0–34.0)
MCHC: 34.1 g/dL (ref 32.0–36.0)
MCV: 85.6 fL (ref 80.0–100.0)
Platelets: 340 10*3/uL (ref 150–440)
RBC: 4.28 MIL/uL (ref 3.80–5.20)
RDW: 15.9 % — ABNORMAL HIGH (ref 11.5–14.5)
WBC: 7.1 10*3/uL (ref 3.6–11.0)

## 2014-11-24 LAB — TROPONIN I

## 2014-11-24 SURGERY — ENDOBRONCHIAL ULTRASOUND (EBUS)
Anesthesia: General | Laterality: Bilateral

## 2014-11-24 SURGERY — ENDOBRONCHIAL ULTRASOUND (EBUS)
Anesthesia: General

## 2014-11-24 MED ORDER — ASPIRIN 81 MG PO CHEW
81.0000 mg | CHEWABLE_TABLET | Freq: Once | ORAL | Status: AC
  Administered 2014-11-24: 81 mg via ORAL
  Filled 2014-11-24: qty 1

## 2014-11-24 MED ORDER — ASPIRIN EC 325 MG PO TBEC
DELAYED_RELEASE_TABLET | ORAL | Status: AC
  Filled 2014-11-24: qty 1

## 2014-11-24 MED ORDER — ASPIRIN 81 MG PO CHEW
CHEWABLE_TABLET | ORAL | Status: AC
  Administered 2014-11-24: 81 mg via ORAL
  Filled 2014-11-24: qty 1

## 2014-11-24 NOTE — ED Notes (Signed)
Pt refused for this RN to start IV.  Pt refused to have blood drawn w/ butterfly.  Pt sts "you ain't sticking me no more".  When reasons for blood/IV needed pt sts "I pass on that".  MD Marcelene Butte informed.

## 2014-11-24 NOTE — OR Nursing (Signed)
Pt presents to [facility] with chest pain. O2 applied, aspirin 81 mg po. Dr Micah Flesher and [place].

## 2014-11-24 NOTE — ED Notes (Signed)
Pt brought down from same day sx, pt was sch to have a bronchoscopy and then dev chest pain. Pt did not have procedure done today. Pt arrives to ed through lobby on stretcher. Pt reports that she has chest pain all the time but was just worse this am prior to having a procedure done. RN from same day gave '81mg'$  asa. Pt a/ox3, no acute distress noted, ED protocols initiated. ekg completed prior to bringing pt down and was signed by edp.

## 2014-11-24 NOTE — Discharge Instructions (Signed)
Nonspecific Chest Pain  °Chest pain can be caused by many different conditions. There is always a chance that your pain could be related to something serious, such as a heart attack or a blood clot in your lungs. Chest pain can also be caused by conditions that are not life-threatening. If you have chest pain, it is very important to follow up with your health care provider. °CAUSES  °Chest pain can be caused by: °· Heartburn. °· Pneumonia or bronchitis. °· Anxiety or stress. °· Inflammation around your heart (pericarditis) or lung (pleuritis or pleurisy). °· A blood clot in your lung. °· A collapsed lung (pneumothorax). It can develop suddenly on its own (spontaneous pneumothorax) or from trauma to the chest. °· Shingles infection (varicella-zoster virus). °· Heart attack. °· Damage to the bones, muscles, and cartilage that make up your chest wall. This can include: °¨ Bruised bones due to injury. °¨ Strained muscles or cartilage due to frequent or repeated coughing or overwork. °¨ Fracture to one or more ribs. °¨ Sore cartilage due to inflammation (costochondritis). °RISK FACTORS  °Risk factors for chest pain may include: °· Activities that increase your risk for trauma or injury to your chest. °· Respiratory infections or conditions that cause frequent coughing. °· Medical conditions or overeating that can cause heartburn. °· Heart disease or family history of heart disease. °· Conditions or health behaviors that increase your risk of developing a blood clot. °· Having had chicken pox (varicella zoster). °SIGNS AND SYMPTOMS °Chest pain can feel like: °· Burning or tingling on the surface of your chest or deep in your chest. °· Crushing, pressure, aching, or squeezing pain. °· Dull or sharp pain that is worse when you move, cough, or take a deep breath. °· Pain that is also felt in your back, neck, shoulder, or arm, or pain that spreads to any of these areas. °Your chest pain may come and go, or it may stay  constant. °DIAGNOSIS °Lab tests or other studies may be needed to find the cause of your pain. Your health care provider may have you take a test called an ambulatory ECG (electrocardiogram). An ECG records your heartbeat patterns at the time the test is performed. You may also have other tests, such as: °· Transthoracic echocardiogram (TTE). During echocardiography, sound waves are used to create a picture of all of the heart structures and to look at how blood flows through your heart. °· Transesophageal echocardiogram (TEE). This is a more advanced imaging test that obtains images from inside your body. It allows your health care provider to see your heart in finer detail. °· Cardiac monitoring. This allows your health care provider to monitor your heart rate and rhythm in real time. °· Holter monitor. This is a portable device that records your heartbeat and can help to diagnose abnormal heartbeats. It allows your health care provider to track your heart activity for several days, if needed. °· Stress tests. These can be done through exercise or by taking medicine that makes your heart beat more quickly. °· Blood tests. °· Imaging tests. °TREATMENT  °Your treatment depends on what is causing your chest pain. Treatment may include: °· Medicines. These may include: °¨ Acid blockers for heartburn. °¨ Anti-inflammatory medicine. °¨ Pain medicine for inflammatory conditions. °¨ Antibiotic medicine, if an infection is present. °¨ Medicines to dissolve blood clots. °¨ Medicines to treat coronary artery disease. °· Supportive care for conditions that do not require medicines. This may include: °¨ Resting. °¨ Applying heat   or cold packs to injured areas.  Limiting activities until pain decreases. HOME CARE INSTRUCTIONS  If you were prescribed an antibiotic medicine, finish it all even if you start to feel better.  Avoid any activities that bring on chest pain.  Do not use any tobacco products, including  cigarettes, chewing tobacco, or electronic cigarettes. If you need help quitting, ask your health care provider.  Do not drink alcohol.  Take medicines only as directed by your health care provider.  Keep all follow-up visits as directed by your health care provider. This is important. This includes any further testing if your chest pain does not go away.  If heartburn is the cause for your chest pain, you may be told to keep your head raised (elevated) while sleeping. This reduces the chance that acid will go from your stomach into your esophagus.  Make lifestyle changes as directed by your health care provider. These may include:  Getting regular exercise. Ask your health care provider to suggest some activities that are safe for you.  Eating a heart-healthy diet. A registered dietitian can help you to learn healthy eating options.  Maintaining a healthy weight.  Managing diabetes, if necessary.  Reducing stress. SEEK MEDICAL CARE IF:  Your chest pain does not go away after treatment.  You have a rash with blisters on your chest.  You have a fever. SEEK IMMEDIATE MEDICAL CARE IF:   Your chest pain is worse.  You have an increasing cough, or you cough up blood.  You have severe abdominal pain.  You have severe weakness.  You faint.  You have chills.  You have sudden, unexplained chest discomfort.  You have sudden, unexplained discomfort in your arms, back, neck, or jaw.  You have shortness of breath at any time.  You suddenly start to sweat, or your skin gets clammy.  You feel nauseous or you vomit.  You suddenly feel light-headed or dizzy.  Your heart begins to beat quickly, or it feels like it is skipping beats. These symptoms may represent a serious problem that is an emergency. Do not wait to see if the symptoms will go away. Get medical help right away. Call your local emergency services (911 in the [place] Do not drive yourself to the hospital.   This  information is not intended to replace advice given to you by your health care provider. Make sure you discuss any questions you have with your health care provider.   Document Released: 10/27/2004 Document Revised: 02/07/2014 Document Reviewed: 08/23/2013 Elsevier Interactive Patient Education [employer].  Please return immediately if condition worsens. Please contact her primary physician or the physician you were given for referral. If you have any specialist physicians involved in her treatment and plan please also contact them. Thank you for using [place] [facility]. Please return especially if you wish to continue your evaluation for chest pain. Recommended studies were for a chest CT to rule out pulmonary embolism (blood clot in your lung) and a repeat troponin to better assess for the possibility of acute coronary syndrome (angina versus heart attack).

## 2014-11-24 NOTE — ED Provider Notes (Signed)
Time Seen: Approximately ----------------------------------------- 2:07 PM on 11/24/2014 -----------------------------------------    I have reviewed the triage notes  Chief Complaint: Chest Pain   History of Present Illness: Emily Kidd is a 64 y.o. female who presents with substernal chest discomfort. Patient stated her pain started 11:15 and lasted till approximately 2 PM. She denies any exacerbating or relieving factors. She denies any pleuritic or positional component. She describes it as a chest tightness. Patient denies any fever or chills or productive cough. She was recently admitted here with pneumonia and was given IV antibiotics and sent home on oral clindamycin which she states she's tolerated well. 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 Previous history of stroke with left-sided weakness.   Past Medical History  Diagnosis Date  . Hypertension   . COPD (chronic obstructive pulmonary disease) [facility])     Patient Active Problem List   Diagnosis Date Noted  . Sepsis ([place]) 11/21/2014  . Acute respiratory failure with hypoxia ([place]) 11/21/2014  . Encephalopathy, metabolic 19/14/7829  . Left upper lobe pneumonia 11/21/2014  . Hemoptysis 11/21/2014  . Pneumonia 11/16/2014  . COPD (chronic obstructive pulmonary disease) ([place]) 11/16/2014  . GERD (gastroesophageal reflux disease) 11/16/2014  . Peripheral neuropathy ([place]) 11/16/2014  . Peripheral vascular disease ([place]) 11/16/2014    No past surgical history on file.  No past surgical history on file.  Current Outpatient Rx  Name  Route  Sig  Dispense  Refill  . acetaminophen (TYLENOL) 325 MG tablet   Oral   Take 1 tablet by mouth every 4 (four) hours as needed.         Marland Kitchen albuterol (PROAIR HFA) 108 (90 BASE) MCG/ACT inhaler   Inhalation   Inhale into the lungs.         Marland Kitchen albuterol (PROVENTIL) (2.5 MG/3ML) 0.083% nebulizer  solution   Inhalation   Inhale into the lungs.         Marland Kitchen amLODipine-atorvastatin (CADUET) 5-20 MG tablet   Oral   Take 1 tablet by mouth daily.          . celecoxib (CELEBREX) 200 MG capsule   Oral   Take 200 mg by mouth daily.          . citalopram (CELEXA) 20 MG tablet   Oral   Take 20 mg by mouth daily.          . clindamycin (CLEOCIN) 300 MG capsule   Oral   Take 1 capsule (300 mg total) by mouth 3 (three) times daily.   42 capsule   0   . LORazepam (ATIVAN) 1 MG tablet   Oral   Take by mouth.         . oxybutynin (DITROPAN) 5 MG tablet   Oral   Take 5 mg by mouth.          . oxybutynin (DITROPAN-XL) 10 MG 24 hr tablet   Oral   Take 10 mg by mouth at bedtime.          Marland Kitchen oxyCODONE-acetaminophen (PERCOCET/ROXICET) 5-325 MG tablet   Oral   Take 1 tablet by mouth every 6 (six) hours as needed.          . pregabalin (LYRICA) 100 MG capsule   Oral   Take 100 mg by mouth daily.          . traMADol (ULTRAM-ER) 100 MG 24 hr tablet  Oral   Take 100 mg by mouth daily as needed.          . traZODone (DESYREL) 150 MG tablet   Oral   Take 150 mg by mouth at bedtime as needed.          . Vitamin D, Ergocalciferol, (DRISDOL) 50000 UNITS CAPS capsule   Oral   Take 50,000 Units by mouth every 7 (seven) days.            Allergies:  Penicillins  Family History: No family history on file.  Social History: Social History  Substance Use Topics  . Smoking status: Former Research scientist (life sciences)  . Smokeless tobacco: None  . Alcohol Use: No     Review of Systems:   10 point review of systems was performed and was otherwise negative:  Constitutional: No fever Eyes: No visual disturbances ENT: No sore throat, ear pain Cardiac: No radiation of the chest discomfort to the arm job back or flank area Respiratory: No shortness of breath, wheezing, or stridor Abdomen: No abdominal pain, no vomiting, No diarrhea Endocrine: No weight loss, No night  sweats Extremities: No peripheral edema, cyanosis Skin: No rashes, easy bruising Neurologic: No focal weakness, trouble with speech or swollowing Urologic: No dysuria, Hematuria, or urinary frequency   Physical Exam:  ED Triage Vitals  Enc Vitals Group     BP 11/24/14 1230 110/61 mmHg     Pulse Rate 11/24/14 1230 81     Resp 11/24/14 1230 16     Temp 11/24/14 1230 98.2 F (36.8 C)     Temp Source 11/24/14 1230 Oral     SpO2 11/24/14 1230 97 %     Weight 11/24/14 1230 152 lb (68.947 kg)     Height 11/24/14 1230 '5\' 4"'$  (1.626 m)     Head Cir --      Peak Flow --      Pain Score 11/24/14 1231 8     Pain Loc --      Pain Edu? --      Excl. in [place]? --     General: Awake , Alert , and Oriented times 3; GCS 15 Head: Normal cephalic , atraumatic Eyes: Pupils equal , round, reactive to light Nose/Throat: No nasal drainage, patent upper airway without erythema or exudate.  Neck: Supple, Full range of motion, No anterior adenopathy or palpable thyroid masses Lungs: Clear to ascultation without wheezes , rhonchi, or rales Heart: Regular rate, regular rhythm without murmurs , gallops , or rubs Abdomen: Soft, non tender without rebound, guarding , or rigidity; bowel sounds positive and symmetric in all 4 quadrants. No organomegaly .        Extremities: Patient has a left-sided above-the-knee amputation. Right side exam shows no calf tenderness or swelling Neurologic: No new motor deficits, sensory intact Skin: warm, dry, no rashes   Labs:   All laboratory work was reviewed including any pertinent negatives or positives listed below:  Labs Reviewed  CBC - Abnormal; Notable for the following:    RDW 15.9 (*)    All other components within normal limits  BASIC METABOLIC PANEL  TROPONIN I   review of laboratory work shows no significant abnormalities  EKG:  ED ECG REPORT I, Daymon Larsen, the attending physician, personally viewed and interpreted this ECG.  Date: 11/24/2014 EKG  Time: 1157 Rate: *81 Rhythm: normal sinus rhythm QRS Axis: normal Intervals: normal ST/T Wave abnormalities: normal Conduction Disutrbances: none Narrative Interpretation: unremarkable Normal EKG  Radiology:  EXAM: CHEST 2 VIEW  COMPARISON: 11/15/2014 and chest CT dated 11/16/2014.  FINDINGS: The cardiac silhouette remains borderline enlarged. No significant change in airspace opacity in the left lung. Clear right lung. Thoracic spine degenerative changes.  IMPRESSION: Stable left lung pneumonia.   I personally reviewed the radiologic studies    ED Course: Differential includes all life-threatening causes for chest pain. This includes but is not exclusive to acute coronary syndrome, aortic dissection, pulmonary embolism, cardiac tamponade, community-acquired pneumonia, pericarditis, musculoskeletal chest wall pain, etc.  Patient's stay here was uneventful. She had an order for a chest CT with contrast to rule out pulmonary embolism. The plan was also to repeat her troponin level. The patient has elected to leave at this time after the initial wave of studies. She is oriented 3 and of competent mind and states that she just does not want to have any further testing. Patient is aware of the risks that she may have one of these life-threatening causes and still elects to be discharged at this time. She was advised she can always return if she wishes to continue her evaluation, and at this time all of her laboratory work and chest x-ray appeared stable and given her past history of recent pneumonia.    Assessment:  Acute unspecified chest pain Pneumonia   Final Clinical Impression:  * Final diagnoses:  Chest pain syndrome     Plan:  Outpatient management Patient was advised to return immediately if condition worsens. Patient was advised to follow up with her primary care physician or other specialized physicians involved and in their current  assessment.             Daymon Larsen, MD 11/24/14 [phone]

## 2014-11-24 NOTE — ED Notes (Signed)
Patient resting in stretcher. Respirations even and unlabored. No obvious distress. Cardiac monitor in place. No needs/concerns verbalized at this time. Call bell within reach. Encouraged to call with needs. Will continue to monitor. 

## 2014-11-24 NOTE — OR Nursing (Signed)
12 lead ekg performed. Pt transported to ER via stretcher.

## 2014-11-25 ENCOUNTER — Encounter: Payer: Self-pay | Admitting: Internal Medicine

## 2014-11-25 LAB — CULTURE, BLOOD (ROUTINE X 2)
CULTURE: NO GROWTH
Culture: NO GROWTH

## 2014-12-01 ENCOUNTER — Ambulatory Visit: Payer: Medicare (Managed Care)

## 2014-12-05 ENCOUNTER — Inpatient Hospital Stay: Admission: RE | Admit: 2014-12-05 | Payer: Medicare (Managed Care) | Source: Ambulatory Visit

## 2014-12-05 ENCOUNTER — Encounter: Payer: Self-pay | Admitting: *Deleted

## 2014-12-05 NOTE — Patient Instructions (Addendum)
  Your procedure is scheduled on: 12-12-14 (FRIDAY) Report to [facility] To find out your arrival time please call [phone] between [place] on 12-11-14 (THURSDAY)  Remember: Instructions that are not followed completely may result in serious medical risk, up to and including death, or upon the discretion of your surgeon and anesthesiologist your surgery may need to be rescheduled.    _X___ 1. Do not eat food or drink liquids after midnight. No gum chewing or hard candies.     _X___ 2. No Alcohol for 24 hours before or after surgery.   ____ 3. Bring all medications with you on the day of surgery if instructed.    _X___ 4. Notify your doctor if there is any change in your medical condition     (cold, fever, infections).     Do not wear jewelry, make-up, hairpins, clips or nail polish.  Do not wear lotions, powders, or perfumes. You may wear deodorant.  Do not shave 48 hours prior to surgery. Men may shave face and neck.  Do not bring valuables to the hospital.    [facility] is not responsible for any belongings or valuables.               Contacts, dentures or bridgework may not be worn into surgery.  Leave your suitcase in the car. After surgery it may be brought to your room.  For patients admitted to the hospital, discharge time is determined by your treatment team.   Patients discharged the day of surgery will not be allowed to drive home.   Please read over the following fact sheets that you were given:    __X__ Take these medicines the morning of surgery with A SIP OF WATER:    1. CARVEDILOL  2. VENLAFAXINE  3. PANTOPRAZOLE  4. LYRICA  5.  6.  ____ Fleet Enema (as directed)   ____ Use CHG Soap as directed  _X__ Use inhalers on the day of surgery-USE SPIRIVA AND DO NEBULIZER TREATMENT PRIOR TO [place]  _X___ Stop metformin 2 days prior to surgery-LAST DOSE ON Tuesday 12-09-14    ____ Take 1/2 of usual insulin dose the  night before surgery and none on the morning of surgery.   _X___ Stop Coumadin/Plavix/aspirin-STOP PLAVIX NOW PER DR KASA   ____ Stop Anti-inflammatories-NO NSAIDS OR ASA PRODUCTS-ACETAMINOPHEN OK TO CONTINUE   _X__ Stop supplements until after surgery-OMEGA XL  AND MELATONIN NOW  ____ Bring C-Pap to the hospital.

## 2014-12-07 ENCOUNTER — Emergency Department
Admission: EM | Admit: 2014-12-07 | Discharge: 2014-12-07 | Disposition: A | Discharge status: Home / Self Care | Payer: Medicare (Managed Care) | Attending: Emergency Medicine | Admitting: Emergency Medicine

## 2014-12-07 ENCOUNTER — Emergency Department: Payer: Medicare (Managed Care)

## 2014-12-07 ENCOUNTER — Encounter: Payer: Self-pay | Admitting: Emergency Medicine

## 2014-12-07 DIAGNOSIS — I10 Essential (primary) hypertension: Secondary | ICD-10-CM | POA: Insufficient documentation

## 2014-12-07 DIAGNOSIS — Z79899 Other long term (current) drug therapy: Secondary | ICD-10-CM | POA: Diagnosis not present

## 2014-12-07 DIAGNOSIS — E119 Type 2 diabetes mellitus without complications: Secondary | ICD-10-CM | POA: Diagnosis not present

## 2014-12-07 DIAGNOSIS — Z880 Allergy status to penicillin: Secondary | ICD-10-CM | POA: Diagnosis not present

## 2014-12-07 DIAGNOSIS — J441 Chronic obstructive pulmonary disease with (acute) exacerbation: Secondary | ICD-10-CM | POA: Insufficient documentation

## 2014-12-07 DIAGNOSIS — R079 Chest pain, unspecified: Secondary | ICD-10-CM | POA: Diagnosis present

## 2014-12-07 DIAGNOSIS — Z792 Long term (current) use of antibiotics: Secondary | ICD-10-CM | POA: Diagnosis not present

## 2014-12-07 DIAGNOSIS — Z87891 Personal history of nicotine dependence: Secondary | ICD-10-CM | POA: Insufficient documentation

## 2014-12-07 LAB — CBC WITH DIFFERENTIAL/PLATELET
BASOS ABS: 0 10*3/uL (ref 0–0.1)
BASOS PCT: 1 %
EOS ABS: 0.3 10*3/uL (ref 0–0.7)
EOS PCT: 5 %
HCT: 40.5 % (ref 35.0–47.0)
Hemoglobin: 13.2 g/dL (ref 12.0–16.0)
Lymphocytes Relative: 17 %
Lymphs Abs: 1 10*3/uL (ref 1.0–3.6)
MCH: 27.9 pg (ref 26.0–34.0)
MCHC: 32.6 g/dL (ref 32.0–36.0)
MCV: 85.8 fL (ref 80.0–100.0)
MONO ABS: 0.5 10*3/uL (ref 0.2–0.9)
Monocytes Relative: 9 %
Neutro Abs: 4.1 10*3/uL (ref 1.4–6.5)
Neutrophils Relative %: 68 %
PLATELETS: 299 10*3/uL (ref 150–440)
RBC: 4.72 MIL/uL (ref 3.80–5.20)
RDW: 16.1 % — AB (ref 11.5–14.5)
WBC: 6 10*3/uL (ref 3.6–11.0)

## 2014-12-07 LAB — BASIC METABOLIC PANEL
ANION GAP: 3 — AB (ref 5–15)
CALCIUM: 8.9 mg/dL (ref 8.9–10.3)
CO2: 23 mmol/L (ref 22–32)
Chloride: 114 mmol/L — ABNORMAL HIGH (ref 101–111)
Creatinine, Ser: 0.65 mg/dL (ref 0.44–1.00)
GFR calc Af Amer: >60 mL/min (ref >60)
GLUCOSE: 95 mg/dL (ref 65–99)
POTASSIUM: 4.9 mmol/L (ref 3.5–5.1)
SODIUM: 140 mmol/L (ref 135–145)

## 2014-12-07 LAB — TROPONIN I

## 2014-12-07 MED ORDER — OXYCODONE-ACETAMINOPHEN 5-325 MG PO TABS
1.0000 | ORAL_TABLET | Freq: Once | ORAL | Status: AC
  Administered 2014-12-07: 1 via ORAL
  Filled 2014-12-07: qty 1

## 2014-12-07 MED ORDER — IOHEXOL 350 MG/ML SOLN
75.0000 mL | Freq: Once | INTRAVENOUS | Status: AC | PRN
  Administered 2014-12-07: 75 mL via INTRAVENOUS

## 2014-12-07 NOTE — ED Provider Notes (Signed)
[facility] Emergency Department Provider Note   ____________________________________________  Time seen: Upon EMS arrival I have reviewed the triage vital signs and the triage nursing note.  HISTORY  Chief Complaint Chest Pain and Hemoptysis   Historian Patient  HPI Emily Kidd is a 64 y.o. female who is here for evaluation of central sharp chest pain which occurred earlier today and is now gone. Patient states she was diagnosed with pneumonia during the hospital stay about 2 weeks ago. She states she still on antibiotic, and checking her bag of pills, it appears as clindamycin. Patient states that she has had some intermittent small amount of coughing up blood since the diagnosis of the pneumonia. She is reporting that she has an appointment for bronchoscopy rescheduled for Friday, due to the fact that she still on Plavix. No trouble breathing, denies cough now. States that she has had some mild wheezing.  No exacerbating or alleviating factors. Symptoms were moderate earlier, and gone now.  Upon chart review, patient was discharged from the hospital after an infectious disease consult to be on clindamycin, which she is. She does have a bronchoscopy rescheduled for this coming Friday. She was seen 2 days ago in the ED for chest discomfort, and after initial troponin negative, and chest x-ray on changed, patient decided to return home rather than stay for CT scan to rule out pulmonary embolism, and a repeat troponin.    Past Medical History  Diagnosis Date  . Hypertension   . COPD (chronic obstructive pulmonary disease) ([place])   . Pneumonia   . Sepsis ([place])   . Lung mass   . Diabetes mellitus without complication ([place])   . Dysrhythmia     TACHYCARDIA  . GERD (gastroesophageal reflux disease)   . Mitral regurgitation   . Carotid artery stenosis   . Muscle spasms of neck   . TIA (transient ischemic attack)   . Anxiety   . Hyperlipidemia   . Major  depressive disorder ([place])   . Chronic pain syndrome   . Drug abuse in remission   . Vertigo   . Stable angina ([facility])   . Neuropathy ([place])   . Left-sided weakness     FROM TIA  . CHF (congestive heart failure) [facility])     Patient Active Problem List   Diagnosis Date Noted  . Sepsis ([place]) 11/21/2014  . Acute respiratory failure with hypoxia ([place]) 11/21/2014  . Encephalopathy, metabolic 16/11/9602  . Left upper lobe pneumonia 11/21/2014  . Hemoptysis 11/21/2014  . Pneumonia 11/16/2014  . COPD (chronic obstructive pulmonary disease) ([place]) 11/16/2014  . GERD (gastroesophageal reflux disease) 11/16/2014  . Peripheral neuropathy ([place]) 11/16/2014  . Peripheral vascular disease ([place]) 11/16/2014    Past Surgical History  Procedure Laterality Date  . Endobronchial ultrasound N/A 11/24/2014    Procedure: ENDOBRONCHIAL ULTRASOUND;  Surgeon: Flora Lipps, MD;  Location: [facility] ORS;  Service: Cardiopulmonary;  Laterality: N/A;  . Above knee amputation Left     Current Outpatient Rx  Name  Route  Sig  Dispense  Refill  . acetaminophen (TYLENOL) 325 MG tablet   Oral   Take 1 tablet by mouth every 4 (four) hours as needed.         Marland Kitchen albuterol (PROAIR HFA) 108 (90 BASE) MCG/ACT inhaler   Inhalation   Inhale into the lungs.         Marland Kitchen albuterol (PROVENTIL) (2.5 MG/3ML) 0.083% nebulizer solution   Inhalation   Inhale into the lungs.         Marland Kitchen  amLODipine-atorvastatin (CADUET) 5-20 MG tablet   Oral   Take 1 tablet by mouth daily.          . celecoxib (CELEBREX) 200 MG capsule   Oral   Take 200 mg by mouth daily.          . citalopram (CELEXA) 20 MG tablet   Oral   Take 20 mg by mouth daily.          . clindamycin (CLEOCIN) 300 MG capsule   Oral   Take 1 capsule (300 mg total) by mouth 3 (three) times daily.   42 capsule   0   . LORazepam (ATIVAN) 1 MG tablet   Oral   Take by mouth.         . oxybutynin (DITROPAN) 5 MG tablet   Oral   Take 5 mg by mouth.           . oxybutynin (DITROPAN-XL) 10 MG 24 hr tablet   Oral   Take 10 mg by mouth at bedtime.          Marland Kitchen oxyCODONE-acetaminophen (PERCOCET/ROXICET) 5-325 MG tablet   Oral   Take 1 tablet by mouth every 6 (six) hours as needed.          . pregabalin (LYRICA) 100 MG capsule   Oral   Take 100 mg by mouth daily.          . traMADol (ULTRAM-ER) 100 MG 24 hr tablet   Oral   Take 100 mg by mouth daily as needed.          . traZODone (DESYREL) 150 MG tablet   Oral   Take 150 mg by mouth at bedtime as needed.          . Vitamin D, Ergocalciferol, (DRISDOL) 50000 UNITS CAPS capsule   Oral   Take 50,000 Units by mouth every 7 (seven) days.            Allergies Penicillins  No family history on file.  Social History Social History  Substance Use Topics  . Smoking status: Former Research scientist (life sciences)  . Smokeless tobacco: None  . Alcohol Use: No    Review of Systems  Constitutional: Negative for fever. Eyes: Negative for visual changes. ENT: Negative for sore throat. Cardiovascular: Negative for chest pain. Respiratory: Negative for shortness of breath. Gastrointestinal: Negative for abdominal pain, vomiting and diarrhea. Genitourinary: Negative for dysuria. Musculoskeletal: Negative for back pain. Skin: Negative for rash. Neurological: Negative for headache. 10 point Review of Systems otherwise negative ____________________________________________   PHYSICAL EXAM:  VITAL SIGNS: ED Triage Vitals  Enc Vitals Group     BP 12/07/14 0855 149/78 mmHg     Pulse Rate 12/07/14 0855 83     Resp 12/07/14 0855 16     Temp 12/07/14 0855 97.8 F (36.6 C)     Temp Source 12/07/14 0855 Oral     SpO2 12/07/14 0855 94 %     Weight 12/07/14 0855 167 lb 1.7 oz (75.8 kg)     Height 12/07/14 0855 '5\' 2"'$  (1.575 m)     Head Cir --      Peak Flow --      Pain Score --      Pain Loc --      Pain Edu? --      Excl. in [place]? --      Constitutional: Alert and oriented. Well appearing and  in no distress. Eyes: Conjunctivae are normal. PERRL. Normal extraocular movements. ENT  Head: Normocephalic and atraumatic.   Nose: No congestion/rhinnorhea.   Mouth/Throat: Mucous membranes are moist.   Neck: No stridor. Cardiovascular/Chest: Normal rate, regular rhythm.  No murmurs, rubs, or gallops. Respiratory: Normal respiratory effort without tachypnea nor retractions. Breath sounds are clear and equal bilaterally. No wheezes/rales/rhonchi. Gastrointestinal: Soft. No distention, no guarding, no rebound. Nontender . Obese.  Genitourinary/rectal:Deferred Musculoskeletal: Nontender with normal range of motion in all extremities. No joint effusions.  No lower extremity tenderness.  Trace lower extremity edema bilaterally. Neurologic:  Normal speech and language. No gross or focal neurologic deficits are appreciated. Skin:  Skin is warm, dry and intact. No rash noted. Psychiatric: Mood and affect are normal. Speech and behavior are normal. Patient exhibits appropriate insight and judgment.  ____________________________________________   EKG I, Lisa Roca, MD, the attending physician have personally viewed and interpreted all ECGs.  81 bpm. Normal sinus rhythm. Narrow QRS. Normal axis. Normal ST and T-wave.  Repeat EKG. 73 bpm. Normal sinus rhythm. Narrow QRS. Normal axis. Normal ST and T-wave ____________________________________________  LABS (pertinent positives/negatives)  Basic metabolic panel without significant abnormality CBC within normal limits with a normal white blood cell count 6.0 with no left shift. Hemoglobin 13.2 and platelet count 299 Troponin less than 0.03 Repeat troponin less than 0.03 ____________________________________________  RADIOLOGY All Xrays were viewed by me. Imaging interpreted by Radiologist.  Chest x-ray portable:  IMPRESSION: Persistent left upper lobe opacification which in the cute setting may be consistent with  pneumonia.  Followup PA and lateral chest X-ray is recommended in 3-4 weeks following trial of antibiotic therapy to ensure resolution and exclude underlying malignancy. __________________________________________  PROCEDURES  Procedure(s) performed: None  Critical Care performed: None  ____________________________________________   ED COURSE / ASSESSMENT AND PLAN  CONSULTATIONS: None  Pertinent labs & imaging results that were available during my care of the patient were reviewed by me and considered in my medical decision making (see chart for details).  EMS initially reported that the patient's chief complaint was coughing up blood, however in talking to patient, she states that that has been minimal and she's had several episodes of this since her diagnosis of "pneumonia." She is actually here this morning due to chest pain episode which was located centrally. When reviewed the past visits, patient was here about one week ago for similar chest pain episode. Her exam and evaluation were reassuring at that time, however there was discussion about obtaining a CTA chest to rule out pulmonary embolism and repeat troponin. I do think that this evaluation is prudent today. I discussed this with the patient and her family member.  No hemoptysis here. Patient states that she would've calm for the minimal blood streaking that came up with her sputum. She came in for chest pain. In terms of her chest discomfort, I suspect it may be coming from the persistent infiltrate. I am less suspicious for acute coronary syndrome, and her EKG and delta troponin are also reassuring.  Patient will be discharged home. She does have bronchoscopy this week.  Patient / Family / Caregiver informed of clinical course, medical decision-making process, and agree with plan.   I discussed return precautions, follow-up instructions, and discharged instructions with patient and/or  family.  ___________________________________________   FINAL CLINICAL IMPRESSION(S) / ED DIAGNOSES   Final diagnoses:  Chest pain, unspecified chest pain type       Lisa Roca, MD 12/07/14 1444

## 2014-12-07 NOTE — ED Notes (Signed)
Patient brought in by [facility], per EMS patient was c/o coughing up bright red blood this morning. Patient was diagnosed with Pneumonia last week, was supposed to have a bronchoscopy 12/08/14 but this was rescheduled for 12/12/14 due to patient being on blood thinners. Per EMS, VSS. Patient has hx/o COPD and CVA. Patient currently c/o chest pain that started this morning around 5am.

## 2014-12-07 NOTE — Discharge Instructions (Signed)
You were evaluated for chest discomfort, and although no certain cause was found, your exam and evaluation are reassuring. I suspect her pain is probably coming from the persistent left lung mass/infection.  Return to the emergency room for any new or worsening condition including any trouble breathing, chest pain, any additional or larger amount of coughing up blood. Return for any fevers. Return for any dizziness or passing out, or any other symptoms concerning to you.  Nonspecific Chest Pain  Chest pain can be caused by many different conditions. There is always a chance that your pain could be related to something serious, such as a heart attack or a blood clot in your lungs. Chest pain can also be caused by conditions that are not life-threatening. If you have chest pain, it is very important to follow up with your health care provider. CAUSES  Chest pain can be caused by:  Heartburn.  Pneumonia or bronchitis.  Anxiety or stress.  Inflammation around your heart (pericarditis) or lung (pleuritis or pleurisy).  A blood clot in your lung.  A collapsed lung (pneumothorax). It can develop suddenly on its own (spontaneous pneumothorax) or from trauma to the chest.  Shingles infection (varicella-zoster virus).  Heart attack.  Damage to the bones, muscles, and cartilage that make up your chest wall. This can include:  Bruised bones due to injury.  Strained muscles or cartilage due to frequent or repeated coughing or overwork.  Fracture to one or more ribs.  Sore cartilage due to inflammation (costochondritis). RISK FACTORS  Risk factors for chest pain may include:  Activities that increase your risk for trauma or injury to your chest.  Respiratory infections or conditions that cause frequent coughing.  Medical conditions or overeating that can cause heartburn.  Heart disease or family history of heart disease.  Conditions or health behaviors that increase your risk of  developing a blood clot.  Having had chicken pox (varicella zoster). SIGNS AND SYMPTOMS Chest pain can feel like:  Burning or tingling on the surface of your chest or deep in your chest.  Crushing, pressure, aching, or squeezing pain.  Dull or sharp pain that is worse when you move, cough, or take a deep breath.  Pain that is also felt in your back, neck, shoulder, or arm, or pain that spreads to any of these areas. Your chest pain may come and go, or it may stay constant. DIAGNOSIS Lab tests or other studies may be needed to find the cause of your pain. Your health care provider may have you take a test called an ambulatory ECG (electrocardiogram). An ECG records your heartbeat patterns at the time the test is performed. You may also have other tests, such as:  Transthoracic echocardiogram (TTE). During echocardiography, sound waves are used to create a picture of all of the heart structures and to look at how blood flows through your heart.  Transesophageal echocardiogram (TEE).This is a more advanced imaging test that obtains images from inside your body. It allows your health care provider to see your heart in finer detail.  Cardiac monitoring. This allows your health care provider to monitor your heart rate and rhythm in real time.  Holter monitor. This is a portable device that records your heartbeat and can help to diagnose abnormal heartbeats. It allows your health care provider to track your heart activity for several days, if needed.  Stress tests. These can be done through exercise or by taking medicine that makes your heart beat more quickly.  Blood tests.  Imaging tests. TREATMENT  Your treatment depends on what is causing your chest pain. Treatment may include:  Medicines. These may include:  Acid blockers for heartburn.  Anti-inflammatory medicine.  Pain medicine for inflammatory conditions.  Antibiotic medicine, if an infection is present.  Medicines to  dissolve blood clots.  Medicines to treat coronary artery disease.  Supportive care for conditions that do not require medicines. This may include:  Resting.  Applying heat or cold packs to injured areas.  Limiting activities until pain decreases. HOME CARE INSTRUCTIONS  If you were prescribed an antibiotic medicine, finish it all even if you start to feel better.  Avoid any activities that bring on chest pain.  Do not use any tobacco products, including cigarettes, chewing tobacco, or electronic cigarettes. If you need help quitting, ask your health care provider.  Do not drink alcohol.  Take medicines only as directed by your health care provider.  Keep all follow-up visits as directed by your health care provider. This is important. This includes any further testing if your chest pain does not go away.  If heartburn is the cause for your chest pain, you may be told to keep your head raised (elevated) while sleeping. This reduces the chance that acid will go from your stomach into your esophagus.  Make lifestyle changes as directed by your health care provider. These may include:  Getting regular exercise. Ask your health care provider to suggest some activities that are safe for you.  Eating a heart-healthy diet. A registered dietitian can help you to learn healthy eating options.  Maintaining a healthy weight.  Managing diabetes, if necessary.  Reducing stress. SEEK MEDICAL CARE IF:  Your chest pain does not go away after treatment.  You have a rash with blisters on your chest.  You have a fever. SEEK IMMEDIATE MEDICAL CARE IF:   Your chest pain is worse.  You have an increasing cough, or you cough up blood.  You have severe abdominal pain.  You have severe weakness.  You faint.  You have chills.  You have sudden, unexplained chest discomfort.  You have sudden, unexplained discomfort in your arms, back, neck, or jaw.  You have shortness of breath at  any time.  You suddenly start to sweat, or your skin gets clammy.  You feel nauseous or you vomit.  You suddenly feel light-headed or dizzy.  Your heart begins to beat quickly, or it feels like it is skipping beats. These symptoms may represent a serious problem that is an emergency. Do not wait to see if the symptoms will go away. Get medical help right away. Call your local emergency services (911 in the [place] Do not drive yourself to the hospital.   This information is not intended to replace advice given to you by your health care provider. Make sure you discuss any questions you have with your health care provider.   Document Released: 10/27/2004 Document Revised: 02/07/2014 Document Reviewed: 08/23/2013 Elsevier Interactive Patient Education [employer].

## 2014-12-10 NOTE — Pre-Procedure Instructions (Signed)
SPOKE WITH MISTY AT DR KASA'S OFFICE AND SHE SAID THAT PT NEEDED TO BE OFF PLAVIX X 7 DAYS AND PTS LAST DOSE WAS ON 12-08-14 (MONDAY)-MISTY SAID DR KASA HAS CHANGED SURGERY DATE TO 12-15-14. I CALLED AND TALKED TO STACEY AT [facility] AND INFORMED HER OF THIS AND REINFORCED THAT PT NEEDS TO STAY OFF PLAVIX UNTIL AFTER HER SURGERY(I HAD SPOKEN WITH STACEY ON 12-08-14 AND TOLD HER THAT DR KASA SAID TO STOP PLAVIX NOW  AND FOR PT TO BE NPO AFTER [place] Sunday NIGHT. STACEY SAID THAT INSTRUCTIONS WERE RECEIVED WHEN FAXED ON Monday.

## 2014-12-12 ENCOUNTER — Encounter: Payer: Self-pay | Admitting: *Deleted

## 2014-12-15 ENCOUNTER — Ambulatory Visit: Payer: Medicare (Managed Care) | Admitting: Anesthesiology

## 2014-12-15 ENCOUNTER — Encounter
Admission: RE | Disposition: A | Discharge status: Home / Self Care | Payer: Self-pay | Source: Ambulatory Visit | Attending: Internal Medicine

## 2014-12-15 ENCOUNTER — Observation Stay
Admission: RE | Admit: 2014-12-15 | Discharge: 2014-12-16 | Disposition: A | Discharge status: Home / Self Care | Payer: Medicare (Managed Care) | Source: Ambulatory Visit | Attending: Internal Medicine | Admitting: Internal Medicine

## 2014-12-15 ENCOUNTER — Encounter: Payer: Self-pay | Admitting: *Deleted

## 2014-12-15 ENCOUNTER — Observation Stay: Payer: Medicare (Managed Care)

## 2014-12-15 DIAGNOSIS — I340 Nonrheumatic mitral (valve) insufficiency: Secondary | ICD-10-CM | POA: Diagnosis not present

## 2014-12-15 DIAGNOSIS — I10 Essential (primary) hypertension: Secondary | ICD-10-CM | POA: Insufficient documentation

## 2014-12-15 DIAGNOSIS — Z87898 Personal history of other specified conditions: Secondary | ICD-10-CM | POA: Insufficient documentation

## 2014-12-15 DIAGNOSIS — Z79899 Other long term (current) drug therapy: Secondary | ICD-10-CM | POA: Insufficient documentation

## 2014-12-15 DIAGNOSIS — J9811 Atelectasis: Secondary | ICD-10-CM | POA: Insufficient documentation

## 2014-12-15 DIAGNOSIS — K219 Gastro-esophageal reflux disease without esophagitis: Secondary | ICD-10-CM | POA: Diagnosis not present

## 2014-12-15 DIAGNOSIS — J449 Chronic obstructive pulmonary disease, unspecified: Secondary | ICD-10-CM | POA: Diagnosis not present

## 2014-12-15 DIAGNOSIS — R042 Hemoptysis: Secondary | ICD-10-CM | POA: Diagnosis not present

## 2014-12-15 DIAGNOSIS — G894 Chronic pain syndrome: Secondary | ICD-10-CM | POA: Diagnosis not present

## 2014-12-15 DIAGNOSIS — Z87891 Personal history of nicotine dependence: Secondary | ICD-10-CM | POA: Diagnosis not present

## 2014-12-15 DIAGNOSIS — J9801 Acute bronchospasm: Secondary | ICD-10-CM | POA: Insufficient documentation

## 2014-12-15 DIAGNOSIS — R590 Localized enlarged lymph nodes: Secondary | ICD-10-CM | POA: Diagnosis not present

## 2014-12-15 DIAGNOSIS — Z7951 Long term (current) use of inhaled steroids: Secondary | ICD-10-CM | POA: Insufficient documentation

## 2014-12-15 DIAGNOSIS — Z881 Allergy status to other antibiotic agents status: Secondary | ICD-10-CM | POA: Diagnosis not present

## 2014-12-15 DIAGNOSIS — E1140 Type 2 diabetes mellitus with diabetic neuropathy, unspecified: Secondary | ICD-10-CM | POA: Diagnosis not present

## 2014-12-15 DIAGNOSIS — Z823 Family history of stroke: Secondary | ICD-10-CM | POA: Insufficient documentation

## 2014-12-15 DIAGNOSIS — R591 Generalized enlarged lymph nodes: Secondary | ICD-10-CM | POA: Diagnosis not present

## 2014-12-15 DIAGNOSIS — I69854 Hemiplegia and hemiparesis following other cerebrovascular disease affecting left non-dominant side: Secondary | ICD-10-CM | POA: Insufficient documentation

## 2014-12-15 DIAGNOSIS — R918 Other nonspecific abnormal finding of lung field: Secondary | ICD-10-CM | POA: Diagnosis not present

## 2014-12-15 DIAGNOSIS — I208 Other forms of angina pectoris: Secondary | ICD-10-CM | POA: Diagnosis not present

## 2014-12-15 DIAGNOSIS — E785 Hyperlipidemia, unspecified: Secondary | ICD-10-CM | POA: Diagnosis not present

## 2014-12-15 DIAGNOSIS — Z794 Long term (current) use of insulin: Secondary | ICD-10-CM | POA: Diagnosis not present

## 2014-12-15 DIAGNOSIS — I739 Peripheral vascular disease, unspecified: Secondary | ICD-10-CM | POA: Insufficient documentation

## 2014-12-15 DIAGNOSIS — R0902 Hypoxemia: Secondary | ICD-10-CM | POA: Diagnosis not present

## 2014-12-15 DIAGNOSIS — Z8249 Family history of ischemic heart disease and other diseases of the circulatory system: Secondary | ICD-10-CM | POA: Insufficient documentation

## 2014-12-15 DIAGNOSIS — M62838 Other muscle spasm: Secondary | ICD-10-CM | POA: Diagnosis not present

## 2014-12-15 DIAGNOSIS — Z7984 Long term (current) use of oral hypoglycemic drugs: Secondary | ICD-10-CM | POA: Insufficient documentation

## 2014-12-15 DIAGNOSIS — I6529 Occlusion and stenosis of unspecified carotid artery: Secondary | ICD-10-CM | POA: Diagnosis not present

## 2014-12-15 DIAGNOSIS — F329 Major depressive disorder, single episode, unspecified: Secondary | ICD-10-CM | POA: Diagnosis not present

## 2014-12-15 DIAGNOSIS — R000 Tachycardia, unspecified: Secondary | ICD-10-CM | POA: Insufficient documentation

## 2014-12-15 DIAGNOSIS — F419 Anxiety disorder, unspecified: Secondary | ICD-10-CM | POA: Diagnosis not present

## 2014-12-15 DIAGNOSIS — R0602 Shortness of breath: Secondary | ICD-10-CM

## 2014-12-15 DIAGNOSIS — R05 Cough: Secondary | ICD-10-CM | POA: Insufficient documentation

## 2014-12-15 DIAGNOSIS — Z880 Allergy status to penicillin: Secondary | ICD-10-CM | POA: Insufficient documentation

## 2014-12-15 DIAGNOSIS — J9589 Other postprocedural complications and disorders of respiratory system, not elsewhere classified: Secondary | ICD-10-CM | POA: Insufficient documentation

## 2014-12-15 DIAGNOSIS — R42 Dizziness and giddiness: Secondary | ICD-10-CM | POA: Insufficient documentation

## 2014-12-15 DIAGNOSIS — J189 Pneumonia, unspecified organism: Secondary | ICD-10-CM | POA: Diagnosis not present

## 2014-12-15 DIAGNOSIS — R599 Enlarged lymph nodes, unspecified: Secondary | ICD-10-CM | POA: Insufficient documentation

## 2014-12-15 HISTORY — DX: Other psychoactive substance abuse, in remission: F19.11

## 2014-12-15 HISTORY — DX: Other muscle spasm: M62.838

## 2014-12-15 HISTORY — DX: Anxiety disorder, unspecified: F41.9

## 2014-12-15 HISTORY — DX: Gastro-esophageal reflux disease without esophagitis: K21.9

## 2014-12-15 HISTORY — DX: Type 2 diabetes mellitus without complications: E11.9

## 2014-12-15 HISTORY — PX: ENDOBRONCHIAL ULTRASOUND: SHX5096

## 2014-12-15 HISTORY — DX: Major depressive disorder, single episode, unspecified: F32.9

## 2014-12-15 HISTORY — DX: Nonrheumatic mitral (valve) insufficiency: I34.0

## 2014-12-15 HISTORY — DX: Transient cerebral ischemic attack, unspecified: G45.9

## 2014-12-15 HISTORY — DX: Other nonspecific abnormal finding of lung field: R91.8

## 2014-12-15 HISTORY — DX: Pneumonia, unspecified organism: J18.9

## 2014-12-15 HISTORY — DX: Occlusion and stenosis of unspecified carotid artery: I65.29

## 2014-12-15 HISTORY — DX: Chronic pain syndrome: G89.4

## 2014-12-15 HISTORY — DX: Hyperlipidemia, unspecified: E78.5

## 2014-12-15 HISTORY — DX: Cardiac arrhythmia, unspecified: I49.9

## 2014-12-15 HISTORY — DX: Polyneuropathy, unspecified: G62.9

## 2014-12-15 HISTORY — DX: Other forms of angina pectoris: I20.8

## 2014-12-15 HISTORY — DX: Sepsis, unspecified organism: A41.9

## 2014-12-15 HISTORY — DX: Weakness: R53.1

## 2014-12-15 HISTORY — DX: Dizziness and giddiness: R42

## 2014-12-15 LAB — URINE DRUG SCREEN, QUALITATIVE (ARMC ONLY)
AMPHETAMINES, UR SCREEN: NOT DETECTED
BARBITURATES, UR SCREEN: NOT DETECTED
BENZODIAZEPINE, UR SCRN: NOT DETECTED
COCAINE METABOLITE, UR ~~LOC~~: NOT DETECTED
Cannabinoid 50 Ng, Ur ~~LOC~~: NOT DETECTED
MDMA (Ecstasy)Ur Screen: NOT DETECTED
Methadone Scn, Ur: NOT DETECTED
OPIATE, UR SCREEN: NOT DETECTED
PHENCYCLIDINE (PCP) UR S: NOT DETECTED
Tricyclic, Ur Screen: NOT DETECTED

## 2014-12-15 LAB — CBC
HCT: 39.9 % (ref 35.0–47.0)
Hemoglobin: 13.2 g/dL (ref 12.0–16.0)
MCH: 28.5 pg (ref 26.0–34.0)
MCHC: 33.1 g/dL (ref 32.0–36.0)
MCV: 86 fL (ref 80.0–100.0)
PLATELETS: 268 10*3/uL (ref 150–440)
RBC: 4.64 MIL/uL (ref 3.80–5.20)
RDW: 16 % — ABNORMAL HIGH (ref 11.5–14.5)
WBC: 9.9 10*3/uL (ref 3.6–11.0)

## 2014-12-15 LAB — BASIC METABOLIC PANEL
Anion gap: 6 (ref 5–15)
BUN: 7 mg/dL (ref 6–20)
CALCIUM: 8.7 mg/dL — AB (ref 8.9–10.3)
CO2: 25 mmol/L (ref 22–32)
CREATININE: 0.63 mg/dL (ref 0.44–1.00)
Chloride: 109 mmol/L (ref 101–111)
Glucose, Bld: 141 mg/dL — ABNORMAL HIGH (ref 65–99)
Potassium: 3.6 mmol/L (ref 3.5–5.1)
SODIUM: 140 mmol/L (ref 135–145)

## 2014-12-15 LAB — GLUCOSE, CAPILLARY
GLUCOSE-CAPILLARY: 99 mg/dL (ref 65–99)
GLUCOSE-CAPILLARY: 168 mg/dL — AB (ref 65–99)
Glucose-Capillary: 98 mg/dL (ref 65–99)

## 2014-12-15 SURGERY — ENDOBRONCHIAL ULTRASOUND (EBUS)
Anesthesia: General

## 2014-12-15 MED ORDER — ROCURONIUM BROMIDE 100 MG/10ML IV SOLN
INTRAVENOUS | Status: DC | PRN
  Administered 2014-12-15 (×2): 5 mg via INTRAVENOUS

## 2014-12-15 MED ORDER — PREDNISONE 20 MG PO TABS
40.0000 mg | ORAL_TABLET | Freq: Once | ORAL | Status: AC
  Administered 2014-12-15: 40 mg via ORAL
  Filled 2014-12-15: qty 2

## 2014-12-15 MED ORDER — INSULIN ASPART 100 UNIT/ML ~~LOC~~ SOLN: 0.0000 [IU] | Freq: Every day | SUBCUTANEOUS | Status: DC

## 2014-12-15 MED ORDER — LOPERAMIDE HCL 2 MG PO CAPS
2.0000 mg | ORAL_CAPSULE | ORAL | Status: DC | PRN
Start: 2014-12-15 — End: 2014-12-16

## 2014-12-15 MED ORDER — DOCUSATE SODIUM 100 MG PO CAPS: 100.0000 mg | ORAL_CAPSULE | Freq: Every day | ORAL | Status: DC | PRN

## 2014-12-15 MED ORDER — ONDANSETRON HCL 4 MG/2ML IJ SOLN
INTRAMUSCULAR | Status: DC | PRN
  Administered 2014-12-15: 4 mg via INTRAVENOUS

## 2014-12-15 MED ORDER — ACETAMINOPHEN 650 MG RE SUPP: 650.0000 mg | Freq: Four times a day (QID) | RECTAL | Status: DC | PRN

## 2014-12-15 MED ORDER — MECLIZINE HCL 25 MG PO TABS: 12.5000 mg | ORAL_TABLET | Freq: Two times a day (BID) | ORAL | Status: DC | PRN

## 2014-12-15 MED ORDER — ONDANSETRON HCL 4 MG/2ML IJ SOLN: 4.0000 mg | Freq: Four times a day (QID) | INTRAMUSCULAR | Status: DC | PRN

## 2014-12-15 MED ORDER — IPRATROPIUM-ALBUTEROL 0.5-2.5 (3) MG/3ML IN SOLN
3.0000 mL | Freq: Once | RESPIRATORY_TRACT | Status: AC
  Administered 2014-12-15: 3 mL via RESPIRATORY_TRACT

## 2014-12-15 MED ORDER — HEPARIN SODIUM (PORCINE) 5000 UNIT/ML IJ SOLN: 5000.0000 [IU] | Freq: Three times a day (TID) | INTRAMUSCULAR | Status: DC

## 2014-12-15 MED ORDER — ACETAMINOPHEN 325 MG PO TABS
650.0000 mg | ORAL_TABLET | Freq: Four times a day (QID) | ORAL | Status: DC | PRN
  Administered 2014-12-16 (×2): 650 mg via ORAL
  Filled 2014-12-15 (×2): qty 2

## 2014-12-15 MED ORDER — BENZONATATE 100 MG PO CAPS
100.0000 mg | ORAL_CAPSULE | Freq: Three times a day (TID) | ORAL | Status: DC | PRN
  Administered 2014-12-16 (×2): 100 mg via ORAL
  Filled 2014-12-15 (×2): qty 1

## 2014-12-15 MED ORDER — NEOSTIGMINE METHYLSULFATE 10 MG/10ML IV SOLN
INTRAVENOUS | Status: DC | PRN
  Administered 2014-12-15: 3 mg via INTRAVENOUS

## 2014-12-15 MED ORDER — FENTANYL CITRATE (PF) 100 MCG/2ML IJ SOLN
INTRAMUSCULAR | Status: DC | PRN
  Administered 2014-12-15 (×2): 50 ug via INTRAVENOUS

## 2014-12-15 MED ORDER — ALBUTEROL SULFATE (2.5 MG/3ML) 0.083% IN NEBU: 2.5000 mg | INHALATION_SOLUTION | RESPIRATORY_TRACT | Status: DC | PRN

## 2014-12-15 MED ORDER — PROPOFOL 10 MG/ML IV BOLUS
INTRAVENOUS | Status: DC | PRN
  Administered 2014-12-15: 150 mg via INTRAVENOUS

## 2014-12-15 MED ORDER — MELATONIN 3 MG PO TABS: 1.0000 | ORAL_TABLET | Freq: Every day | ORAL | Status: DC

## 2014-12-15 MED ORDER — SUCCINYLCHOLINE CHLORIDE 20 MG/ML IJ SOLN
INTRAMUSCULAR | Status: DC | PRN
  Administered 2014-12-15: 120 mg via INTRAVENOUS

## 2014-12-15 MED ORDER — IPRATROPIUM-ALBUTEROL 0.5-2.5 (3) MG/3ML IN SOLN
RESPIRATORY_TRACT | Status: AC
  Administered 2014-12-15: 3 mL via RESPIRATORY_TRACT
  Filled 2014-12-15: qty 3

## 2014-12-15 MED ORDER — CLOPIDOGREL BISULFATE 75 MG PO TABS
75.0000 mg | ORAL_TABLET | Freq: Every day | ORAL | Status: DC
  Administered 2014-12-15: 75 mg via ORAL
  Filled 2014-12-15: qty 1

## 2014-12-15 MED ORDER — ZOLPIDEM TARTRATE 5 MG PO TABS
5.0000 mg | ORAL_TABLET | Freq: Every evening | ORAL | Status: DC | PRN
  Administered 2014-12-15: 5 mg via ORAL
  Filled 2014-12-15: qty 1

## 2014-12-15 MED ORDER — INSULIN ASPART 100 UNIT/ML ~~LOC~~ SOLN
0.0000 [IU] | Freq: Three times a day (TID) | SUBCUTANEOUS | Status: DC
  Administered 2014-12-16: 1 [IU] via SUBCUTANEOUS
  Filled 2014-12-15: qty 1

## 2014-12-15 MED ORDER — VENLAFAXINE HCL ER 75 MG PO CP24: 75.0000 mg | ORAL_CAPSULE | Freq: Every day | ORAL | Status: DC

## 2014-12-15 MED ORDER — SODIUM CHLORIDE 0.9 % IV SOLN: 250.0000 mL | INTRAVENOUS | Status: DC | PRN

## 2014-12-15 MED ORDER — SENNA 8.6 MG PO TABS
1.0000 | ORAL_TABLET | Freq: Every day | ORAL | Status: DC
  Administered 2014-12-15 – 2014-12-16 (×2): 8.6 mg via ORAL
  Filled 2014-12-15 (×2): qty 1

## 2014-12-15 MED ORDER — PROPOFOL 500 MG/50ML IV EMUL
INTRAVENOUS | Status: DC | PRN
Start: 2014-12-15 — End: 2014-12-15
  Administered 2014-12-15: 100 ug/kg/min via INTRAVENOUS

## 2014-12-15 MED ORDER — BUDESONIDE-FORMOTEROL FUMARATE 160-4.5 MCG/ACT IN AERO
2.0000 | INHALATION_SPRAY | Freq: Two times a day (BID) | RESPIRATORY_TRACT | Status: DC
  Administered 2014-12-15 – 2014-12-16 (×2): 2 via RESPIRATORY_TRACT
  Filled 2014-12-15: qty 6

## 2014-12-15 MED ORDER — ATORVASTATIN CALCIUM 20 MG PO TABS
40.0000 mg | ORAL_TABLET | Freq: Every day | ORAL | Status: DC
  Administered 2014-12-15: 40 mg via ORAL
  Filled 2014-12-15: qty 2

## 2014-12-15 MED ORDER — POLYETHYLENE GLYCOL 3350 17 G PO PACK
17.0000 g | PACK | Freq: Every day | ORAL | Status: DC
  Filled 2014-12-15 (×2): qty 1

## 2014-12-15 MED ORDER — LIDOCAINE HCL 2 % EX GEL: 1.0000 "application " | Freq: Once | CUTANEOUS | Status: DC

## 2014-12-15 MED ORDER — ONDANSETRON HCL 4 MG PO TABS: 4.0000 mg | ORAL_TABLET | Freq: Four times a day (QID) | ORAL | Status: DC | PRN

## 2014-12-15 MED ORDER — TIOTROPIUM BROMIDE MONOHYDRATE 18 MCG IN CAPS
18.0000 ug | ORAL_CAPSULE | Freq: Every day | RESPIRATORY_TRACT | Status: DC
  Administered 2014-12-16: 18 ug via RESPIRATORY_TRACT
  Filled 2014-12-15: qty 5

## 2014-12-15 MED ORDER — PHENYLEPHRINE HCL 10 MG/ML IJ SOLN
INTRAMUSCULAR | Status: DC | PRN
  Administered 2014-12-15: 100 ug via INTRAVENOUS
  Administered 2014-12-15: 200 ug via INTRAVENOUS
  Administered 2014-12-15 (×3): 100 ug via INTRAVENOUS

## 2014-12-15 MED ORDER — FENTANYL CITRATE (PF) 100 MCG/2ML IJ SOLN: 25.0000 ug | INTRAMUSCULAR | Status: DC | PRN

## 2014-12-15 MED ORDER — ALUM & MAG HYDROXIDE-SIMETH 200-200-20 MG/5ML PO SUSP: 10.0000 mL | Freq: Four times a day (QID) | ORAL | Status: DC | PRN

## 2014-12-15 MED ORDER — MIDAZOLAM HCL 2 MG/2ML IJ SOLN
INTRAMUSCULAR | Status: DC | PRN
  Administered 2014-12-15: 1 mg via INTRAVENOUS

## 2014-12-15 MED ORDER — SODIUM CHLORIDE 0.9 % IJ SOLN
3.0000 mL | Freq: Two times a day (BID) | INTRAMUSCULAR | Status: DC
  Administered 2014-12-15 – 2014-12-16 (×2): 3 mL via INTRAVENOUS

## 2014-12-15 MED ORDER — CARVEDILOL 3.125 MG PO TABS
3.1250 mg | ORAL_TABLET | Freq: Two times a day (BID) | ORAL | Status: DC
  Administered 2014-12-16: 3.125 mg via ORAL
  Filled 2014-12-15: qty 1

## 2014-12-15 MED ORDER — SODIUM CHLORIDE 0.9 % IV SOLN
INTRAVENOUS | Status: DC
Start: 2014-12-15 — End: 2014-12-15
  Administered 2014-12-15: 15:00:00 via INTRAVENOUS

## 2014-12-15 MED ORDER — SODIUM CHLORIDE 0.9 % IJ SOLN: 3.0000 mL | INTRAMUSCULAR | Status: DC | PRN

## 2014-12-15 MED ORDER — GLYCOPYRROLATE 0.2 MG/ML IJ SOLN
INTRAMUSCULAR | Status: DC | PRN
  Administered 2014-12-15: 0.4 mg via INTRAVENOUS

## 2014-12-15 MED ORDER — DEXAMETHASONE SODIUM PHOSPHATE 4 MG/ML IJ SOLN
INTRAMUSCULAR | Status: DC | PRN
  Administered 2014-12-15: 8 mg via INTRAVENOUS

## 2014-12-15 MED ORDER — ONDANSETRON HCL 4 MG/2ML IJ SOLN: 4.0000 mg | Freq: Once | INTRAMUSCULAR | Status: DC | PRN

## 2014-12-15 MED ORDER — PANTOPRAZOLE SODIUM 40 MG PO TBEC
40.0000 mg | DELAYED_RELEASE_TABLET | Freq: Every day | ORAL | Status: DC
  Administered 2014-12-15 – 2014-12-16 (×2): 40 mg via ORAL
  Filled 2014-12-15 (×2): qty 1

## 2014-12-15 MED ORDER — LIDO-CAPSAICIN-MEN-METHYL SAL 0.5-0.035-5-20 % EX PTCH: 1.0000 | MEDICATED_PATCH | Freq: Every day | CUTANEOUS | Status: DC

## 2014-12-15 NOTE — H&P (Signed)
[place] [facility] [place] NAME: Emily Kidd    MR#:  [id number]  DATE OF BIRTH:  1951/01/21  DATE OF ADMISSION:  12/15/2014  PRIMARY CARE PHYSICIAN: [facility]   REQUESTING/REFERRING PHYSICIAN: Dr. Mortimer Fries  CHIEF COMPLAINT:  No chief complaint on file.  hypoxic after bronchoscopy.  HISTORY OF PRESENT ILLNESS:  Emily Kidd  is a 64 y.o. female with a known history of hypertension, COPD and diabetes. Patient got bronchoscopy for evaluation of adenopathy today. After extubation, the patient was found hypoxic with O2 saturation at 70 to 80s. The patient was put on oxygen 3 L by nasal cannular oxygen saturation increased to 90s. However, the patient has hypoxia without oxygen several times. Patient complains of cough and shortness of breath, but denies any fever or chills, no chest pain or wheezing. Hospitalist was called for admission.  PAST MEDICAL HISTORY:   Past Medical History  Diagnosis Date  . Hypertension   . COPD (chronic obstructive pulmonary disease) ([place])   . Pneumonia   . Sepsis ([place])   . Lung mass   . Diabetes mellitus without complication ([place])   . Dysrhythmia     TACHYCARDIA  . GERD (gastroesophageal reflux disease)   . Mitral regurgitation   . Carotid artery stenosis   . Muscle spasms of neck   . TIA (transient ischemic attack)   . Anxiety   . Hyperlipidemia   . Major depressive disorder ([place])   . Chronic pain syndrome   . Drug abuse in remission   . Vertigo   . Stable angina ([facility])   . Neuropathy ([place])   . Left-sided weakness     FROM TIA    PAST SURGICAL HISTORY:   Past Surgical History  Procedure Laterality Date  . Endobronchial ultrasound N/A 11/24/2014    Procedure: ENDOBRONCHIAL ULTRASOUND;  Surgeon: Flora Lipps, MD;  Location: [facility] ORS;  Service: Cardiopulmonary;  Laterality: N/A;  . Above knee amputation Left     SOCIAL HISTORY:   Social History  Substance Use Topics  . Smoking  status: Former Research scientist (life sciences)  . Smokeless tobacco: Not on file  . Alcohol Use: No    FAMILY HISTORY:   Family History  Problem Relation Age of Onset  . Heart attack Father   . Stroke Father     DRUG ALLERGIES:   Allergies  Allergen Reactions  . Penicillins Rash    REVIEW OF SYSTEMS:  CONSTITUTIONAL: No fever, fatigue or weakness.  EYES: No blurred or double vision.  EARS, NOSE, AND THROAT: No tinnitus or ear pain.  RES: Has cough and shortness of breath,  no wheezing or hemoptysis.  CARDIOVASCULAR: No chest pain, orthopnea, edema.  GASTROINTESTINAL: No nausea, vomiting, diarrhea or abdominal pain.  GENITOURINARY: No dysuria, hematuria.  ENDOCRINE: No polyuria, nocturia,  HEMATOLOGY: No anemia, easy bruising or bleeding SKIN: No rash or lesion. MUSCULOSKELETAL: No joint pain or arthritis.   NEUROLOGIC: No tingling, numbness, weakness.  PSYCHIATRY: No anxiety or depression.   MEDICATIONS AT HOME:   Prior to Admission medications   Medication Sig Start Date End Date Taking? Authorizing Provider  albuterol (PROVENTIL) (2.5 MG/3ML) 0.083% nebulizer solution Inhale into the lungs.   Yes Historical Provider, MD  alum & mag hydroxide-simeth (MAALOX ADVANCED MAX ST) 932-355-73 MG/5ML suspension Take 10 mLs by mouth 4 (four) times daily as needed for indigestion.   Yes Historical Provider, MD  amLODipine-atorvastatin (CADUET) 5-20 MG tablet Take 1 tablet by mouth daily.  Yes Historical Provider, MD  atorvastatin (LIPITOR) 40 MG tablet Take 40 mg by mouth at bedtime.   Yes Historical Provider, MD  benzocaine (HURRICAINE) 20 % GEL Use as directed 1 application in the mouth or throat 3 (three) times daily as needed.   Yes Historical Provider, MD  benzonatate (TESSALON) 100 MG capsule Take 100 mg by mouth 3 (three) times daily as needed for cough.   Yes Historical Provider, MD  camphor-menthol Timoteo Ace) lotion Apply 1 application topically 4 (four) times daily as needed for itching.   Yes  Historical Provider, MD  Carboxymethylcellul-Glycerin (OPTIVE) 0.5-0.9 % SOLN Apply 1 drop to eye 3 (three) times daily as needed.   Yes Historical Provider, MD  carvedilol (COREG) 3.125 MG tablet Take 3.125 mg by mouth 2 (two) times daily with a meal.   Yes Historical Provider, MD  celecoxib (CELEBREX) 200 MG capsule Take 200 mg by mouth daily.    Yes Historical Provider, MD  clopidogrel (PLAVIX) 75 MG tablet Take 75 mg by mouth daily.   Yes Historical Provider, MD  Diaper Rash Products (DESITIN) OINT Apply topically every morning.   Yes Historical Provider, MD  diclofenac sodium (VOLTAREN) 1 % GEL Apply 2 g topically every 8 (eight) hours as needed (apply to right shoulder).   Yes Historical Provider, MD  docusate sodium (COLACE) 100 MG capsule Take 100 mg by mouth daily as needed for mild constipation.   Yes Historical Provider, MD  Lido-Capsaicin-Men-Methyl Sal (MEDI-PATCH-LIDOCAINE) 0.5-0.035-5-20 % PTCH Apply 1 patch topically at bedtime.   Yes Historical Provider, MD  loperamide (IMODIUM A-D) 2 MG capsule Take 2 mg by mouth as needed for diarrhea or loose stools (2 by mouth after 1st loose stool, then 1 following each subsequent loose stool).   Yes Historical Provider, MD  meclizine (ANTIVERT) 12.5 MG tablet Take 12.5 mg by mouth 2 (two) times daily as needed for dizziness or nausea.   Yes Historical Provider, MD  Melatonin 3 MG TABS Take 1 tablet by mouth at bedtime.   Yes Historical Provider, MD  metFORMIN (GLUCOPHAGE) 500 MG tablet Take 500 mg by mouth daily.   Yes Historical Provider, MD  miconazole (ZEASORB-AF) 2 % powder Apply topically as needed for itching (apply to groin after bath each morning).   Yes Historical Provider, MD  OVER THE COUNTER MEDICATION 1 tablet every morning.   Yes Historical Provider, MD  pantoprazole (PROTONIX) 40 MG tablet Take 40 mg by mouth daily.   Yes Historical Provider, MD  polyethylene glycol (MIRALAX / GLYCOLAX) packet Take 17 g by mouth daily.   Yes  Historical Provider, MD  pregabalin (LYRICA) 200 MG capsule Take 200 mg by mouth.   Yes Historical Provider, MD  SALINE NASAL SPRAY NA Place 1-2 sprays into the nose daily as needed.   Yes Historical Provider, MD  senna (SENOKOT) 8.6 MG TABS tablet Take 1 tablet by mouth daily.   Yes Historical Provider, MD  tiotropium (SPIRIVA HANDIHALER) 18 MCG inhalation capsule Place 18 mcg into inhaler and inhale daily.   Yes Historical Provider, MD  traZODone (DESYREL) 150 MG tablet Take 100 mg by mouth at bedtime.    Yes Historical Provider, MD  venlafaxine (EFFEXOR) 75 MG tablet Take 75 mg by mouth daily.   Yes Historical Provider, MD  Vitamin D, Ergocalciferol, (DRISDOL) 50000 UNITS CAPS capsule Take 50,000 Units by mouth every 7 (seven) days.    Yes Historical Provider, MD  acetaminophen (TYLENOL) 325 MG tablet Take 650 mg by  mouth 3 (three) times daily as needed.     Historical Provider, MD  albuterol (PROAIR HFA) 108 (90 BASE) MCG/ACT inhaler Inhale into the lungs.    Historical Provider, MD  citalopram (CELEXA) 20 MG tablet Take 20 mg by mouth daily.     Historical Provider, MD  clindamycin (CLEOCIN) 300 MG capsule Take 1 capsule (300 mg total) by mouth 3 (three) times daily. 11/21/14   Theodoro Grist, MD  LORazepam (ATIVAN) 1 MG tablet Take by mouth.    Historical Provider, MD  oxybutynin (DITROPAN) 5 MG tablet Take 5 mg by mouth at bedtime.     Historical Provider, MD  oxybutynin (DITROPAN-XL) 10 MG 24 hr tablet Take 10 mg by mouth at bedtime.     Historical Provider, MD  pregabalin (LYRICA) 100 MG capsule Take 100 mg by mouth daily.     Historical Provider, MD  traMADol (ULTRAM-ER) 100 MG 24 hr tablet Take 100 mg by mouth daily as needed.     Historical Provider, MD      VITAL SIGNS:  Blood pressure 128/93, pulse 95, temperature 96.8 F (36 C), temperature source Tympanic, resp. rate 16, height '5\' 2"'$  (1.575 m), weight 75.751 kg (167 lb), SpO2 89 %.  PHYSICAL EXAMINATION:  GENERAL:  64  y.o.-year-old patient lying in the bed with no acute distress.  EYES: Pupils equal, round, reactive to light and accommodation. No scleral icterus. Extraocular muscles intact.  HEENT: Head atraumatic, normocephalic. Oropharynx and nasopharynx clear.  NECK:  Supple, no jugular venous distention. No thyroid enlargement, no tenderness.  LUNG: Course breath sounds bilaterally, no wheezing, rales,rhonchi or crepitation. No use of accessory muscles of respiration.  CARDIOVASCULAR: S1, S2 normal. No murmurs, rubs, or gallops.  ABDOMEN: Soft, nontender, nondistended. Bowel sounds present. No organomegaly or mass.  EXTREMITIES: No pedal edema, cyanosis, or clubbing.  left AKA.  NEUROLOGIC: Cranial nerves II through XII are intact. Muscle strength 5/5 in all extremities. Sensation intact. Gait not checked.  PSYCHIATRIC: The patient is alert and oriented x 3.  SKIN: No obvious rash, lesion, or ulcer.   LABORATORY PANEL:   CBC No results for input(s): WBC, HGB, HCT, PLT in the last 168 hours. ------------------------------------------------------------------------------------------------------------------  Chemistries  No results for input(s): NA, K, CL, CO2, GLUCOSE, BUN, CREATININE, CALCIUM, MG, AST, ALT, ALKPHOS, BILITOT in the last 168 hours.  Invalid input(s): GFRCGP ------------------------------------------------------------------------------------------------------------------  Cardiac Enzymes No results for input(s): TROPONINI in the last 168 hours. ------------------------------------------------------------------------------------------------------------------  RADIOLOGY:  No results found.  EKG:   Orders placed or performed during the hospital encounter of 12/07/14  . ED EKG  . ED EKG    IMPRESSION AND PLAN:   Hypoxia Adenopathy COPD Possible OSA Hypertension Diabetes PVD  The patient will be placed for observation. I will get a chest x-ray, CBC and BMP.  Continue  oxygen by nasal cannular, DuoNeb when necessary, prednisone 40 mg by mouth 1 dose and Pulmicort twice a day. Continue Spiriva. Tessalon when necessary. Start Sliding scale.  continue Plavix, Coreg and Lipitor.   I discussed with the anesthesiologist.   All the records are reviewed and case discussed with ED provider. Management plans discussed with the patient, family and they are in agreement.  CODE STATUS: Full code  TOTAL TIME TAKING CARE OF THIS PATIENT: 53 minutes.    Demetrios Loll M.D on 12/15/2014 at 6:18 PM  Between 7am to 6pm - Pager - [phone]  After 6pm go to [email or web address] - password [email or web address] [facility]  Office  [phone]  CC: Primary care physician; [place]

## 2014-12-15 NOTE — Progress Notes (Signed)
Hospital doctor in to see pt   resp unlabored and regular sat on 4 liters 94   When off oxygen  Sat goes to 85  Encouraging deep breathing and incentive spirmethy  Lungs sounds clear but diminished

## 2014-12-15 NOTE — Progress Notes (Signed)
Patient complains of difficulty falling asleep at night, would like to get something to help her . MD Jannifer Franklin notified. Order for Ambien 5 Mg PRN placed. Will continue to monitor.   Iran Sizer M

## 2014-12-15 NOTE — Progress Notes (Signed)
Called dr Mortimer Fries told to wait 15 minutes and then try pt off oxygen   Check sat then

## 2014-12-15 NOTE — Progress Notes (Signed)
Oxygen sat remains low off oxygen  Coughing a little less    Dr piscitello   In to see pt

## 2014-12-15 NOTE — Progress Notes (Signed)
Working with incentive spirometry  Can pull up to 738m

## 2014-12-15 NOTE — Interval H&P Note (Signed)
History and Physical Interval Note:  12/15/2014 3:03 PM  Emily Kidd  has presented today for surgery, with the diagnosis of lung mass,pneumonia adenopathy  The various methods of treatment have been discussed with the patient and family. After consideration of risks, benefits and other options for treatment, the patient has consented to  Procedure(s): ENDOBRONCHIAL ULTRASOUND (N/A) as a surgical intervention .  The patient's history has been reviewed, patient examined, no change in status, stable for surgery.  I have reviewed the patient's chart and labs.  Questions were answered to the patient's satisfaction.     Flora Lipps

## 2014-12-15 NOTE — Discharge Instructions (Signed)
Flexible Bronchoscopy, Care After Refer to this sheet in the next few weeks. These instructions provide you with information on caring for yourself after your procedure. Your health care provider may also give you more specific instructions. Your treatment has been planned according to current medical practices, but problems sometimes occur. Call your health care provider if you have any problems or questions after your procedure.  WHAT TO EXPECT AFTER THE PROCEDURE It is normal to have the following symptoms for 24-48 hours after the procedure:   Increased cough.  Low-grade fever.  Sore throat or hoarse voice.  Small streaks of blood in your thick spit (sputum) if tissue samples were taken (biopsy). HOME CARE INSTRUCTIONS   Do not eat or drink anything for 2 hours after your procedure. Your nose and throat were numbed by medicine. If you try to eat or drink before the medicine wears off, food or drink could go into your lungs or you could burn yourself. After the numbness is gone and your cough and gag reflexes have returned, you may eat soft food and drink liquids slowly.   The day after the procedure, you can go back to your normal diet.   You may resume normal activities.   Keep all follow-up visits as directed by your health care provider. It is important to keep all your appointments, especially if tissue samples were taken for testing (biopsy). SEEK IMMEDIATE MEDICAL CARE IF:   You have increasing shortness of breath.   You become light-headed or faint.   You have chest pain.   You have any new concerning symptoms.  You cough up more than a small amount of blood.  The amount of blood you cough up increases. MAKE SURE YOU:  Understand these instructions.  Will watch your condition.  Will get help right away if you are not doing well or get worse.   This information is not intended to replace advice given to you by your health care provider. Make sure you discuss  any questions you have with your health care provider.   Document Released: 08/06/2004 Document Revised: 02/07/2014 Document Reviewed: 09/21/2012 Elsevier Interactive Patient Education [employer].

## 2014-12-15 NOTE — Anesthesia Postprocedure Evaluation (Signed)
  Anesthesia Post-op Note  Patient: Emily Kidd  Procedure(s) Performed: Procedure(s): ENDOBRONCHIAL ULTRASOUND (N/A)  Anesthesia type:General  Patient location: PACU  Post pain: Pain level controlled  Post assessment: Post-op Vital signs reviewed, Patient's Cardiovascular Status Stable, Respiratory Function Stable, Patent Airway and No signs of Nausea or vomiting  Post vital signs: Reviewed and stable  Last Vitals:  Filed Vitals:   12/15/14 1800  BP: 128/93  Pulse: 95  Temp:   Resp: 16    Level of consciousness: awake, alert  and patient cooperative  Complications: Patient desaturates to low 80's when taken off of 2 L [place].  No increased work of breathing, dyspnea, sedation or obstruction. Lungs clear.  Plan to admit for overnight observation.

## 2014-12-15 NOTE — Anesthesia Procedure Notes (Signed)
Procedure Name: Intubation Date/Time: 12/15/2014 3:16 PM Performed by: Johnna Acosta Pre-anesthesia Checklist: Patient identified, Emergency Drugs available, Suction available and Patient being monitored Patient Re-evaluated:Patient Re-evaluated prior to inductionOxygen Delivery Method: Circle system utilized Preoxygenation: Pre-oxygenation with 100% oxygen Intubation Type: IV induction Ventilation: Oral airway inserted - appropriate to patient size and Mask ventilation without difficulty Laryngoscope Size: Miller and 2 Grade View: Grade I Tube type: Oral Tube size: 8.5 mm Number of attempts: 1 Placement Confirmation: ETT inserted through vocal cords under direct vision and positive ETCO2 Secured at: 21 cm Tube secured with: Tape Dental Injury: Teeth and Oropharynx as per pre-operative assessment

## 2014-12-15 NOTE — Progress Notes (Signed)
Dr piscitello called sat low off oxygen  To come and see pt

## 2014-12-15 NOTE — H&P (View-Only) (Signed)
ID E note Reviewed CT, ENT notes. At this point needs bronch - can be done as otpt.  For abx for Strep Mitis- PCN allergic -  I would give 2 weeks of Clindamycin oral 300 TID at this time and fu me in 2 weeks Will repeat bcx off abx to ensure clearance. May need TEE if persistent.

## 2014-12-15 NOTE — Progress Notes (Signed)
Checking lungs periodicly   Remains pretty much the same  Low air flow with same rhonchi throughtout

## 2014-12-15 NOTE — Op Note (Signed)
PROCEDURE: ENDOBRONCHIAL ULTRASOUND   PROCEDURE DATE: 12/15/2014  TIME:  NAME:  Emily Kidd  DOB:December 10, 1950  MRN: [id number] LOC:  [facility]/None    HOSP DAY: '@LENGTHOFSTAYDAYS'$ @ CODE STATUS:        Indications/Preliminary Diagnosis: adenopathy  Consent: (Place X beside choice/s below)  The benefits, risks and possible complications of the procedure were        explained to:  _x__ patient  ___ patient's family  ___ other:___________  who verbalized understanding and gave:  ___ verbal  ___ written  _x__ verbal and written  ___ telephone  ___ other:________ consent.      Unable to obtain consent; procedure performed on emergent basis.     Other:       PRESEDATION ASSESSMENT: History and Physical has been performed. Patient meds and allergies have been reviewed. Presedation airway examination has been performed and documented. Baseline vital signs, sedation score, oxygenation status, and cardiac rhythm were reviewed. Patient was deemed to be in satisfactory condition to undergo the procedure.    PREMEDICATIONS: SEE ANESTHESIOLOGY RECORDS   Airway Prep (Place X beside choice below)   1% Transtracheal Lidocaine Anesthetization 7 cc   Patient prepped per Bronchoscopy Lab Policy       Insertion Route (Place X beside choice below)   Nasal   Oral  x Endotracheal Tube   Tracheostomy   INTRAPROCEDURE MEDICATIONS: SEE ANESTHESIOLOGY RECORDS   PROCEDURE DETAILS: Timeout performed and correct patient, name, & ID confirmed. Following prep per Pulmonary policy, appropriate sedation was administered.  Airway exam proceeded with findings, technical procedures, and specimen collection as noted below. At the end of exam the scope was withdrawn without incident. Impression and Plan as noted below.  There was extensive subcarinal adenopathy visualized under [place], approx 3x4 cm. Multiple samples were taken       TECHNICAL PROCEDURES: (Place X beside choice below)   Procedures  Description    None     Electrocautery     Cryotherapy     Balloon Dilatation     Bronchography     Stent Placement     Therapeutic Aspiration     Laser/Argon Plasma    Brachytherapy Catheter Placement    Foreign Body Removal     SPECIMENS (Sites): (Place X beside choice below)  Specimens Description   No Specimens Obtained     [place]    Lavage   x Biopsies TRANSBRONCHIAL BIOPSIES FNA 21 GAUGE x 5   Fine Needle Aspirates    Brushings    Sputum    FINDINGS: large subcarinal adenopathy 3x4 CM ESTIMATED BLOOD LOSS: none COMPLICATIONS/RESOLUTION: none       IMPRESSION:POST-PROCEDURE DX: adenopathy    RECOMMENDATION/PLAN:  Follow up pathology resports    Corrin Parker, M.D.  [facility]  Medical Director [place] Director [place] Department

## 2014-12-15 NOTE — Anesthesia Preprocedure Evaluation (Signed)
Anesthesia Evaluation  Patient identified by MRN, date of birth, ID band Patient awake    Reviewed: Allergy & Precautions, NPO status , Patient's Chart, lab work & pertinent test results, reviewed documented beta blocker date and time   Airway Mallampati: II  TM Distance: >3 FB     Dental  (+) Chipped, Edentulous Upper, Edentulous Lower   Pulmonary pneumonia, resolved, COPD, former smoker,           Cardiovascular hypertension, Pt. on medications and Pt. on home beta blockers + angina + Peripheral Vascular Disease  + dysrhythmias      Neuro/Psych PSYCHIATRIC DISORDERS Anxiety Depression TIA Neuromuscular disease    GI/Hepatic GERD  ,  Endo/Other  diabetes, Type 2  Renal/GU      Musculoskeletal   Abdominal   Peds  Hematology   Anesthesia Other Findings   Reproductive/Obstetrics                             Anesthesia Physical Anesthesia Plan  ASA: III  Anesthesia Plan: General   Post-op Pain Management:    Induction: Intravenous  Airway Management Planned: Oral ETT  Additional Equipment:   Intra-op Plan:   Post-operative Plan:   Informed Consent: I have reviewed the patients History and Physical, chart, labs and discussed the procedure including the risks, benefits and alternatives for the proposed anesthesia with the patient or authorized representative who has indicated his/her understanding and acceptance.     Plan Discussed with: CRNA  Anesthesia Plan Comments:         Anesthesia Quick Evaluation

## 2014-12-15 NOTE — Progress Notes (Signed)
Oxygen sat  Down to 84 when off oxygen   Coughing continues  Duo neb ordered and given

## 2014-12-15 NOTE — Progress Notes (Signed)
PHARMACIST - PHYSICIAN ORDER COMMUNICATION  CONCERNING: P&T Medication Policy on Herbal Medications  DESCRIPTION:  This patient's order for:  melatonin  has been noted.  This product(s) is classified as an "herbal" or natural product. Due to a lack of definitive safety studies or FDA approval, nonstandard manufacturing practices, plus the potential risk of unknown drug-drug interactions while on inpatient medications, the Pharmacy and Therapeutics Committee does not permit the use of "herbal" or natural products of this type within [facility].   ACTION TAKEN: The pharmacy department is unable to verify this order at this time and your patient has been informed of this safety policy. Please reevaluate patient's clinical condition at discharge and address if the herbal or natural product(s) should be resumed at that time.  Rexene Edison, PharmD Clinical Pharmacist  12/15/2014 8:03 PM

## 2014-12-15 NOTE — Op Note (Signed)
PROCEDURE: BRONCHOSCOPY Therapeutic Aspiration of Tracheobronchial Tree  PROCEDURE DATE: 12/15/2014  TIME:  NAME:  Emily Kidd  DOB:05-12-1950  MRN: [id number] LOC:  ARPO/None    HOSP DAY: '@LENGTHOFSTAYDAYS'$ @ CODE STATUS:        Indications/Preliminary Diagnosis: adenopathy and pneumonia  Consent: (Place X beside choice/s below)  The benefits, risks and possible complications of the procedure were        explained to:  _x__ patient  ___ patient's family  ___ other:___________  who verbalized understanding and gave:  ___ verbal  ___ written  _x__ verbal and written  ___ telephone  ___ other:________ consent.      Unable to obtain consent; procedure performed on emergent basis.     Other:       PRESEDATION ASSESSMENT: History and Physical has been performed. Patient meds and allergies have been reviewed. Presedation airway examination has been performed and documented. Baseline vital signs, sedation score, oxygenation status, and cardiac rhythm were reviewed. Patient was deemed to be in satisfactory condition to undergo the procedure.    PREMEDICATIONS:   Sedative/Narcotic Amt Dose   Versed  mg   Fentanyl  mcg  Diprivan  mg            PROCEDURE DETAILS: Timeout performed and correct patient, name, & ID confirmed. Following prep per Pulmonary policy, appropriate sedation was administered. The Bronchoscope was inserted in to oral cavity with bite block in place. Therapeutic aspiration of Tracheobronchial tree was performed.  Airway exam proceeded with findings, technical procedures, and specimen collection as noted below. At the end of exam the scope was withdrawn without incident. Impression and Plan as noted below.   Bronchoscope was inserted into airway, airway examination off all segments did not revelalany acute process         Airway Prep (Place X beside choice below)   1% Transtracheal Lidocaine Anesthetization 7 cc   Patient prepped per Bronchoscopy Lab  Policy       Insertion Route (Place X beside choice below)   Nasal   Oral   Endotracheal Tube   Tracheostomy   INTRAPROCEDURE MEDICATIONS:  Sedative/Narcotic Amt Dose   Versed  mg   Fentanyl  mcg  Diprivan  mg       Medication Amt Dose  Medication Amt Dose  Lidocaine 1%  cc  Epinephrine 1:10,000 sol  cc  Xylocaine 4%  cc  Cocaine  cc   TECHNICAL PROCEDURES: (Place X beside choice below)   Procedures  Description    None     Electrocautery     Cryotherapy     Balloon Dilatation     Bronchography     Stent Placement     Therapeutic Aspiration     Laser/Argon Plasma    Brachytherapy Catheter Placement    Foreign Body Removal         SPECIMENS (Sites): (Place X beside choice below)  Specimens Description   No Specimens Obtained     [place]   x Lavage 40 cc's instilled in LUL, 15 cc's return volume:bloody   Biopsies    Fine Needle Aspirates    Brushings    Sputum    FINDINGS: no acute findings, no masses seen, no active bleeding noted  ESTIMATED BLOOD LOSS: none COMPLICATIONS/RESOLUTION: none      IMPRESSION:POST-PROCEDURE DX: Normal airway exam    RECOMMENDATION/PLAN:  Follow up BAL    Corrin Parker, M.D.  [facility] Pulmonary & Critical Care Medicine  Medical Director [place]  Medical Director [place] Department

## 2014-12-15 NOTE — Transfer of Care (Signed)
Immediate Anesthesia Transfer of Care Note  Patient: ADALAYA IRION  Procedure(s) Performed: Procedure(s): ENDOBRONCHIAL ULTRASOUND (N/A)  Patient Location: PACU  Anesthesia Type:General  Level of Consciousness: awake and alert   Airway & Oxygen Therapy: Patient Spontanous Breathing and Patient connected to face mask oxygen  Post-op Assessment: Report given to RN and Post -op Vital signs reviewed and stable  Post vital signs: Reviewed and stable  Last Vitals:  Filed Vitals:   12/15/14 1613  BP: 118/79  Pulse: 104  Temp: 36 C  Resp:     Complications: No apparent anesthesia complications

## 2014-12-16 DIAGNOSIS — R591 Generalized enlarged lymph nodes: Secondary | ICD-10-CM

## 2014-12-16 DIAGNOSIS — R0902 Hypoxemia: Secondary | ICD-10-CM

## 2014-12-16 LAB — GLUCOSE, CAPILLARY
GLUCOSE-CAPILLARY: 116 mg/dL — AB (ref 65–99)
Glucose-Capillary: 128 mg/dL — ABNORMAL HIGH (ref 65–99)

## 2014-12-16 MED ORDER — PREDNISONE 10 MG PO TABS
30.0000 mg | ORAL_TABLET | Freq: Every day | ORAL | Status: DC
Start: 2014-12-16 — End: 2015-02-10

## 2014-12-16 MED ORDER — PREDNISONE 20 MG PO TABS
30.0000 mg | ORAL_TABLET | Freq: Every day | ORAL | Status: DC
  Administered 2014-12-16: 30 mg via ORAL
  Filled 2014-12-16: qty 1

## 2014-12-16 NOTE — Care Management (Signed)
Contacted [facility] and notified patient's nurse Charita of patient's admission

## 2014-12-16 NOTE — Consult Note (Signed)
[place] [facility] Consultation     Date: 12/16/2014,   MRN# [id number] Emily Kidd [birth year] Code Status:     Code Status Orders        Start     Ordered   12/15/14 1902  Full code   Continuous     12/15/14 1901     Hosp day:'@LENGTHOFSTAYDAYS'$ @ Referring MD: '@ATDPROV'$ @     PCP:      AdmissionWeight: 167 lb (75.751 kg)                 CurrentWeight: 167 lb (75.751 kg) Emily Kidd is a 64 y.o. old female seen in consultation for hypoxia after procedure  CC: hypoxia    HPI  64 yo AAF s/p EBUS for adenopathy and pneumonia, patient had hypoxia after her EBUS yesterday down to mid 80's And no acute SOB or WOB, patient weaned off oxygen this AM, alert and awake, NAD.  Patient with some hemoptysis last night, will plan to keep off plavix, follow up pathology results, will keep prednisone for now No other issues at this time, patient wants to go home  Home Medication:  No current outpatient prescriptions on file.  Current Medication:   Current facility-administered medications:  .  0.9 %  sodium chloride infusion, 250 mL, Intravenous, PRN, Demetrios Loll, MD .  acetaminophen (TYLENOL) tablet 650 mg, 650 mg, Oral, Q6H PRN, 650 mg at 12/16/14 1026 **OR** acetaminophen (TYLENOL) suppository 650 mg, 650 mg, Rectal, Q6H PRN, Demetrios Loll, MD .  albuterol (PROVENTIL) (2.5 MG/3ML) 0.083% nebulizer solution 2.5 mg, 2.5 mg, Nebulization, Q2H PRN, Demetrios Loll, MD .  alum & mag hydroxide-simeth (MAALOX/MYLANTA) 200-200-20 MG/5ML suspension 10 mL, 10 mL, Oral, QID PRN, Demetrios Loll, MD .  atorvastatin (LIPITOR) tablet 40 mg, 40 mg, Oral, QHS, Demetrios Loll, MD, 40 mg at 12/15/14 2334 .  benzonatate (TESSALON) capsule 100 mg, 100 mg, Oral, TID PRN, Demetrios Loll, MD, 100 mg at 12/16/14 0817 .  budesonide-formoterol (SYMBICORT) 160-4.5 MCG/ACT inhaler 2 puff, 2 puff, Inhalation, BID, Demetrios Loll, MD, 2 puff at 12/16/14 [phone] .  carvedilol (COREG) tablet 3.125 mg, 3.125 mg, Oral, BID WC, Demetrios Loll,  MD, 3.125 mg at 12/16/14 0817 .  docusate sodium (COLACE) capsule 100 mg, 100 mg, Oral, Daily PRN, Demetrios Loll, MD .  heparin injection 5,000 Units, 5,000 Units, Subcutaneous, 3 times per day, Demetrios Loll, MD, 5,000 Units at 12/15/14 2200 .  insulin aspart (novoLOG) injection 0-5 Units, 0-5 Units, Subcutaneous, QHS, Demetrios Loll, MD, 0 Units at 12/15/14 2221 .  insulin aspart (novoLOG) injection 0-9 Units, 0-9 Units, Subcutaneous, TID WC, Demetrios Loll, MD, 1 Units at 12/16/14 0818 .  loperamide (IMODIUM) capsule 2 mg, 2 mg, Oral, PRN, Demetrios Loll, MD .  meclizine (ANTIVERT) tablet 12.5 mg, 12.5 mg, Oral, BID PRN, Demetrios Loll, MD .  ondansetron [facility]) tablet 4 mg, 4 mg, Oral, Q6H PRN **OR** ondansetron (ZOFRAN) injection 4 mg, 4 mg, Intravenous, Q6H PRN, Demetrios Loll, MD .  pantoprazole (PROTONIX) EC tablet 40 mg, 40 mg, Oral, Daily, Demetrios Loll, MD, 40 mg at 12/16/14 1010 .  polyethylene glycol (MIRALAX / GLYCOLAX) packet 17 g, 17 g, Oral, Daily, Demetrios Loll, MD, 17 g at 12/15/14 2045 .  predniSONE (DELTASONE) tablet 30 mg, 30 mg, Oral, Q breakfast, Loletha Grayer, MD, 30 mg at 12/16/14 0817 .  senna (SENOKOT) tablet 8.6 mg, 1 tablet, Oral, Daily, Demetrios Loll, MD, 8.6 mg at 12/16/14 1009 .  sodium chloride 0.9 %  injection 3 mL, 3 mL, Intravenous, Q12H, Demetrios Loll, MD, 3 mL at 12/16/14 1010 .  sodium chloride 0.9 % injection 3 mL, 3 mL, Intravenous, PRN, Demetrios Loll, MD .  tiotropium [facility]) inhalation capsule 18 mcg, 18 mcg, Inhalation, Daily, Demetrios Loll, MD, 18 mcg at 12/16/14 [phone] .  venlafaxine [facility]) tablet 75 mg, 75 mg, Oral, Daily, Demetrios Loll, MD .  zolpidem [facility]) tablet 5 mg, 5 mg, Oral, QHS PRN, Lance Coon, MD, 5 mg at 12/15/14 2334     ALLERGIES   Azithromycin and Penicillins     REVIEW OF SYSTEMS   Review of Systems  Constitutional: Negative.   HENT: Negative.   Respiratory: Positive for hemoptysis. Negative for sputum production and shortness of breath.   Cardiovascular: Negative for  chest pain and palpitations.  Gastrointestinal: Negative.   Skin: Negative.      VS: BP 175/54 mmHg  Pulse 104  Temp(Src) 98.3 F (36.8 C) (Oral)  Resp 20  Ht '5\' 2"'$  (1.575 m)  Wt 167 lb (75.751 kg)  BMI 30.54 kg/m2  SpO2 89%     PHYSICAL EXAM   Physical Exam  Constitutional: She is oriented to person, place, and time. She appears well-developed and well-nourished. No distress.  HENT:  Head: Normocephalic and atraumatic.  Eyes: Pupils are equal, round, and reactive to light.  Neck: Normal range of motion.  Cardiovascular: Normal rate, regular rhythm and normal heart sounds.   No murmur heard. Pulmonary/Chest: Effort normal and breath sounds normal. No respiratory distress. She has no wheezes.  Abdominal: Soft. Bowel sounds are normal.  Neurological: She is alert and oriented to person, place, and time.  Skin: She is not diaphoretic.        LABS    Recent Labs     12/15/14  1953  HGB  13.2  HCT  39.9  MCV  86.0  WBC  9.9  BUN  7  CREATININE  0.63  GLUCOSE  141*  CALCIUM  8.7*  ,    IMAGING    Portable Chest 1 View  12/15/2014  CLINICAL DATA:  Hypoxic after bronchoscopy. EXAM: PORTABLE CHEST 1 VIEW COMPARISON:  12/07/2014 FINDINGS: The cardiac silhouette, mediastinal and hilar contours are grossly stable. There is tortuosity and calcification of the thoracic aorta. Persistent and progressive left lung airspace process. Could not exclude superimposed hemorrhage or asymmetric edema in the left lung. Right basilar atelectasis is also noted. No pneumothorax is identified. IMPRESSION: Worsening left lung aeration with progressive airspace disease. Right lower lobe atelectasis. No postprocedural pneumothorax. Electronically Signed   By: Marijo Sanes M.D.   On: 12/15/2014 18:49       ASSESSMENT/PLAN   64 yo AAF admitted for observation yesterday for mild hypoxia after procedure from bronchospasms and some mild hemoptysis from needle biopsy  1.prednisone 30  mg daily for 7 days 2.no abx at this time-await BAL cultures 3.await FNA of subcarinal adenopathy 4.follow up with Dr. Ola Spurr    Patient satisfied with Plan of action and management. All questions answered   Corrin Parker, M.D.  [facility]  Medical Director [place] Director [facility] Cardio-Pulmonary Department

## 2014-12-16 NOTE — Progress Notes (Signed)
Pt discharged home with her [facility] program after visit from pulmonologist,  IV site DCd earlier at pt's request, no tele monitor, pt dressed and placed in her chair for pick up by [facility] transporter, pt given her discharge instructions, her [facility] RN was also given a copy and told verbally about pt's scrip and f/u appts, understanding verbalized. Pt was picked up by [facility] transporter.

## 2014-12-16 NOTE — Care Management (Signed)
Discharge order placed.  [facility] transportation contacted.  Transport to pick patient up from room at 1 pm.  Patient to be ready and in wheelchair when they arrive.  Malachy Mood RN notified.

## 2014-12-16 NOTE — Discharge Summary (Signed)
[place] [facility] [place] NAME: Emily Kidd    MR#:  [id number]  DATE OF BIRTH:  05-03-1950  DATE OF ADMISSION:  12/15/2014 ADMITTING PHYSICIAN: Demetrios Loll, MD  DATE OF DISCHARGE: 12/16/2014  PRIMARY CARE PHYSICIAN: [place]    ADMISSION DIAGNOSIS:  lung mass,pneumonia adenopathy  DISCHARGE DIAGNOSIS:  Principal Problem:   Hypoxia Active Problems:   Adenopathy   SECONDARY DIAGNOSIS:   Past Medical History  Diagnosis Date  . Hypertension   . COPD (chronic obstructive pulmonary disease) ([place])   . Pneumonia   . Sepsis ([place])   . Lung mass   . Diabetes mellitus without complication ([place])   . Dysrhythmia     TACHYCARDIA  . GERD (gastroesophageal reflux disease)   . Mitral regurgitation   . Carotid artery stenosis   . Muscle spasms of neck   . TIA (transient ischemic attack)   . Anxiety   . Hyperlipidemia   . Major depressive disorder ([place])   . Chronic pain syndrome   . Drug abuse in remission   . Vertigo   . Stable angina ([facility])   . Neuropathy ([place])   . Left-sided weakness     FROM TIA    HOSPITAL COURSE:   1. Acute hypoxic respiratory failure with hypoxia. Patient after bronchoscopy had hypoxia and was brought in as an observation. This morning her pulse ox is 93% on room air and breathing more comfortably. We will give a prednisone 30 mg daily for 7 days. She will continue her inhalers. 2. Hemoptysis status post bronchoscopy. Patient was evaluated by Dr. Mortimer Fries. And he felt it was safe to go home at this point. My associate did start Plavix last night. At this point we are unable to give Plavix. I will hold Plavix for at least 2 weeks. I will leave it up to the PMD on whether to restart that. 3. Lung mass- status post bronchoscopy. Follow-up cultures and biopsy results. Further management as outpatient depending on results. 4. Essential hypertension continue usual medications 5. Hyperlipidemia  unspecified continue atorvastatin 6. Type 2 diabetes with neuropathy- continue Glucophage and Lyrica 7. Gastroesophageal reflux disease without esophagitis continue PPI  DISCHARGE CONDITIONS:   Fair  CONSULTS OBTAINED:  Pulmonary  DRUG ALLERGIES:   Allergies  Allergen Reactions  . Azithromycin   . Penicillins Rash    DISCHARGE MEDICATIONS:   Current Discharge Medication List    START taking these medications   Details  predniSONE (DELTASONE) 10 MG tablet Take 3 tablets (30 mg total) by mouth daily with breakfast. Qty: 21 tablet, Refills: 0      CONTINUE these medications which have NOT CHANGED   Details  acetaminophen (TYLENOL) 650 MG CR tablet Take 650 mg by mouth 3 (three) times daily.    albuterol (PROVENTIL) (2.5 MG/3ML) 0.083% nebulizer solution Take 2.5 mg by nebulization every 4 (four) hours as needed for shortness of breath.     alum & mag hydroxide-simeth (MAALOX ADVANCED MAX ST) 400-400-40 MG/5ML suspension Take 15 mLs by mouth 4 (four) times daily as needed (nausea).     atorvastatin (LIPITOR) 40 MG tablet Take 40 mg by mouth at bedtime.    benzocaine (HURRICAINE) 20 % GEL Use as directed 1 application in the mouth or throat 3 (three) times daily as needed (for pain).     benzonatate (TESSALON) 100 MG capsule Take 100 mg by mouth 3 (three) times daily as needed for cough.  camphor-menthol (SARNA) lotion Apply 1 application topically 4 (four) times daily as needed for itching.    Carboxymethylcellul-Glycerin (OPTIVE) 0.5-0.9 % SOLN Apply 1 drop to eye See admin instructions. 3 to 4 times daily as needed for dryness/irritation    carvedilol (COREG) 3.125 MG tablet Take 3.125 mg by mouth 2 (two) times daily.     Diaper Rash Products (DESITIN) OINT Apply 1 application topically every morning. To buttocks    diclofenac sodium (VOLTAREN) 1 % GEL Apply 2 g topically every 8 (eight) hours as needed (for pain).     docusate sodium (COLACE) 100 MG capsule Take  100 mg by mouth daily as needed for mild constipation.    lidocaine (LIDODERM) 5 % Place 1 patch onto the skin at bedtime. Remove & Discard patch within 12 hours or as directed by MD    loperamide (IMODIUM A-D) 2 MG capsule Take 2 mg by mouth as needed for diarrhea or loose stools (2 by mouth after 1st loose stool, then 1 following each subsequent loose stool).    Melatonin 3 MG TABS Take 1 tablet by mouth at bedtime.    metFORMIN (GLUCOPHAGE) 500 MG tablet Take 500 mg by mouth daily.    miconazole (ZEASORB-AF) 2 % powder Apply 1 application topically every morning. To thoroughly dry groin after bath    Omega-3 Fatty Acids (OMEGA-3 FISH OIL PO) Take 2 capsules by mouth daily.    oxybutynin (DITROPAN-XL) 10 MG 24 hr tablet Take 10 mg by mouth at bedtime.     pantoprazole (PROTONIX) 40 MG tablet Take 40 mg by mouth every morning.     polyethylene glycol (MIRALAX / GLYCOLAX) packet Take 17 g by mouth every evening.     !! pregabalin (LYRICA) 150 MG capsule Take 150 mg by mouth 2 (two) times daily. Along with lyrica 50 mg    !! pregabalin (LYRICA) 200 MG capsule Take 200 mg by mouth.    !! pregabalin (LYRICA) 50 MG capsule Take 50 mg by mouth 2 (two) times daily. Along with Lyrica 150 mg    senna (SENOKOT) 8.6 MG TABS tablet Take 1 tablet by mouth every evening.     sodium chloride (OCEAN) 0.65 % SOLN nasal spray Place 1-2 sprays into both nostrils as needed for congestion.    tiotropium (SPIRIVA HANDIHALER) 18 MCG inhalation capsule Place 18 mcg into inhaler and inhale daily.    traZODone (DESYREL) 100 MG tablet Take 100 mg by mouth at bedtime.    venlafaxine XR (EFFEXOR-XR) 75 MG 24 hr capsule Take 75 mg by mouth daily.    Vitamin D, Ergocalciferol, (DRISDOL) 50000 UNITS CAPS capsule Take 50,000 Units by mouth every 30 (thirty) days. On the first Monday of each month     !! - Potential duplicate medications found. Please discuss with provider.    STOP taking these medications      clopidogrel (PLAVIX) 75 MG tablet      meclizine (ANTIVERT) 12.5 MG tablet      venlafaxine (EFFEXOR) 75 MG tablet          DISCHARGE INSTRUCTIONS:   Follow-up PMD one week  If you experience worsening of your admission symptoms, develop shortness of breath, life threatening emergency, suicidal or homicidal thoughts you must seek medical attention immediately by calling 911 or calling your MD immediately  if symptoms less severe.  You Must read complete instructions/literature along with all the possible adverse reactions/side effects for all the Medicines you take and that have been  prescribed to you. Take any new Medicines after you have completely understood and accept all the possible adverse reactions/side effects.   Please note  You were cared for by a hospitalist during your hospital stay. If you have any questions about your discharge medications or the care you received while you were in the hospital after you are discharged, you can call the unit and asked to speak with the hospitalist on call if the hospitalist that took care of you is not available. Once you are discharged, your primary care physician will handle any further medical issues. Please note that NO REFILLS for any discharge medications will be authorized once you are discharged, as it is imperative that you return to your primary care physician (or establish a relationship with a primary care physician if you do not have one) for your aftercare needs so that they can reassess your need for medications and monitor your lab values.    Today   CHIEF COMPLAINT:  No chief complaint on file.   HISTORY OF PRESENT ILLNESS:  Emily Kidd  is a 64 y.o. female admitted with hypoxia after bronchoscopy   VITAL SIGNS:  Blood pressure 175/54, pulse 104, temperature 98.3 F (36.8 C), temperature source Oral, resp. rate 20, height '5\' 2"'$  (1.575 m), weight 75.751 kg (167 lb), SpO2 89 %.    PHYSICAL EXAMINATION:  GENERAL:   64 y.o.-year-old patient lying in the bed with no acute distress.  EYES: Pupils equal, round, reactive to light and accommodation. No scleral icterus. Extraocular muscles intact.  HEENT: Head atraumatic, normocephalic. Oropharynx and nasopharynx clear.  NECK:  Supple, no jugular venous distention. No thyroid enlargement, no tenderness.  LUNGS: Decreased breath sounds bilaterally, no wheezing, rales,rhonchi or crepitation. No use of accessory muscles of respiration. Patient with hemoptysis small amount. CARDIOVASCULAR: S1, S2 normal. No murmurs, rubs, or gallops.  ABDOMEN: Soft, non-tender, non-distended. Bowel sounds present. No organomegaly or mass.  EXTREMITIES: No pedal edema, cyanosis, or clubbing.  NEUROLOGIC: Cranial nerves II through XII are intact. Muscle strength 5/5 in all extremities. Sensation intact. Gait not checked.  PSYCHIATRIC: The patient is alert and oriented x 3.  SKIN: No obvious rash, lesion, or ulcer.   DATA REVIEW:   CBC  Recent Labs Lab 12/15/14 1953  WBC 9.9  HGB 13.2  HCT 39.9  PLT 268    Chemistries   Recent Labs Lab 12/15/14 1953  NA 140  K 3.6  CL 109  CO2 25  GLUCOSE 141*  BUN 7  CREATININE 0.63  CALCIUM 8.7*     RADIOLOGY:  Portable Chest 1 View  12/15/2014  CLINICAL DATA:  Hypoxic after bronchoscopy. EXAM: PORTABLE CHEST 1 VIEW COMPARISON:  12/07/2014 FINDINGS: The cardiac silhouette, mediastinal and hilar contours are grossly stable. There is tortuosity and calcification of the thoracic aorta. Persistent and progressive left lung airspace process. Could not exclude superimposed hemorrhage or asymmetric edema in the left lung. Right basilar atelectasis is also noted. No pneumothorax is identified. IMPRESSION: Worsening left lung aeration with progressive airspace disease. Right lower lobe atelectasis. No postprocedural pneumothorax. Electronically Signed   By: Marijo Sanes M.D.   On: 12/15/2014 18:49     Management plans discussed  with the patient, and pulmonary and they are in agreement.  CODE STATUS:     Code Status Orders        Start     Ordered   12/15/14 1902  Full code   Continuous     12/15/14 1901  TOTAL TIME TAKING CARE OF THIS PATIENT: 35 minutes.    Loletha Grayer M.D on 12/16/2014 at 11:45 AM  Between 7am to 6pm - Pager - [phone]  After 6pm go to [email or web address] - password [email or web address] [place] [facility]  Office  [phone]  CC: Primary care physician; [place]

## 2014-12-17 LAB — ACID FAST SMEAR (AFB)

## 2014-12-17 LAB — ACID FAST SMEAR (AFB, MYCOBACTERIA): Acid Fast Smear: NEGATIVE

## 2014-12-18 LAB — CULTURE, BAL-QUANTITATIVE: CULTURE: NORMAL

## 2014-12-18 LAB — CULTURE, BAL-QUANTITATIVE W GRAM STAIN: Special Requests: NORMAL

## 2014-12-19 LAB — CYTOLOGY - NON PAP

## 2015-01-01 ENCOUNTER — Ambulatory Visit: Payer: Medicare (Managed Care)

## 2015-01-04 ENCOUNTER — Encounter: Payer: Self-pay | Admitting: Emergency Medicine

## 2015-01-04 ENCOUNTER — Observation Stay
Admission: EM | Admit: 2015-01-04 | Discharge: 2015-01-06 | Disposition: A | Discharge status: Home / Self Care | Payer: Medicare (Managed Care) | Attending: Internal Medicine | Admitting: Internal Medicine

## 2015-01-04 ENCOUNTER — Emergency Department: Payer: Medicare (Managed Care)

## 2015-01-04 DIAGNOSIS — K219 Gastro-esophageal reflux disease without esophagitis: Secondary | ICD-10-CM | POA: Diagnosis not present

## 2015-01-04 DIAGNOSIS — F329 Major depressive disorder, single episode, unspecified: Secondary | ICD-10-CM | POA: Insufficient documentation

## 2015-01-04 DIAGNOSIS — R1310 Dysphagia, unspecified: Secondary | ICD-10-CM | POA: Diagnosis not present

## 2015-01-04 DIAGNOSIS — Z79899 Other long term (current) drug therapy: Secondary | ICD-10-CM | POA: Diagnosis not present

## 2015-01-04 DIAGNOSIS — R079 Chest pain, unspecified: Secondary | ICD-10-CM | POA: Diagnosis not present

## 2015-01-04 DIAGNOSIS — Z791 Long term (current) use of non-steroidal anti-inflammatories (NSAID): Secondary | ICD-10-CM | POA: Insufficient documentation

## 2015-01-04 DIAGNOSIS — R918 Other nonspecific abnormal finding of lung field: Secondary | ICD-10-CM | POA: Diagnosis not present

## 2015-01-04 DIAGNOSIS — Z880 Allergy status to penicillin: Secondary | ICD-10-CM | POA: Diagnosis not present

## 2015-01-04 DIAGNOSIS — R42 Dizziness and giddiness: Secondary | ICD-10-CM | POA: Insufficient documentation

## 2015-01-04 DIAGNOSIS — E785 Hyperlipidemia, unspecified: Secondary | ICD-10-CM | POA: Insufficient documentation

## 2015-01-04 DIAGNOSIS — R042 Hemoptysis: Secondary | ICD-10-CM | POA: Diagnosis not present

## 2015-01-04 DIAGNOSIS — Z7952 Long term (current) use of systemic steroids: Secondary | ICD-10-CM | POA: Insufficient documentation

## 2015-01-04 DIAGNOSIS — F419 Anxiety disorder, unspecified: Secondary | ICD-10-CM | POA: Diagnosis not present

## 2015-01-04 DIAGNOSIS — R590 Localized enlarged lymph nodes: Secondary | ICD-10-CM | POA: Insufficient documentation

## 2015-01-04 DIAGNOSIS — J9601 Acute respiratory failure with hypoxia: Secondary | ICD-10-CM | POA: Diagnosis not present

## 2015-01-04 DIAGNOSIS — D500 Iron deficiency anemia secondary to blood loss (chronic): Secondary | ICD-10-CM | POA: Diagnosis not present

## 2015-01-04 DIAGNOSIS — J9811 Atelectasis: Secondary | ICD-10-CM | POA: Diagnosis not present

## 2015-01-04 DIAGNOSIS — J189 Pneumonia, unspecified organism: Principal | ICD-10-CM | POA: Diagnosis present

## 2015-01-04 DIAGNOSIS — E1140 Type 2 diabetes mellitus with diabetic neuropathy, unspecified: Secondary | ICD-10-CM | POA: Diagnosis not present

## 2015-01-04 DIAGNOSIS — M419 Scoliosis, unspecified: Secondary | ICD-10-CM | POA: Diagnosis not present

## 2015-01-04 DIAGNOSIS — Z87891 Personal history of nicotine dependence: Secondary | ICD-10-CM | POA: Insufficient documentation

## 2015-01-04 DIAGNOSIS — I2510 Atherosclerotic heart disease of native coronary artery without angina pectoris: Secondary | ICD-10-CM | POA: Diagnosis not present

## 2015-01-04 DIAGNOSIS — Z8249 Family history of ischemic heart disease and other diseases of the circulatory system: Secondary | ICD-10-CM | POA: Diagnosis not present

## 2015-01-04 DIAGNOSIS — G894 Chronic pain syndrome: Secondary | ICD-10-CM | POA: Diagnosis not present

## 2015-01-04 DIAGNOSIS — Z8673 Personal history of transient ischemic attack (TIA), and cerebral infarction without residual deficits: Secondary | ICD-10-CM | POA: Insufficient documentation

## 2015-01-04 DIAGNOSIS — Z7984 Long term (current) use of oral hypoglycemic drugs: Secondary | ICD-10-CM | POA: Insufficient documentation

## 2015-01-04 DIAGNOSIS — Z823 Family history of stroke: Secondary | ICD-10-CM | POA: Insufficient documentation

## 2015-01-04 DIAGNOSIS — J441 Chronic obstructive pulmonary disease with (acute) exacerbation: Secondary | ICD-10-CM

## 2015-01-04 DIAGNOSIS — I119 Hypertensive heart disease without heart failure: Secondary | ICD-10-CM | POA: Insufficient documentation

## 2015-01-04 DIAGNOSIS — R0902 Hypoxemia: Secondary | ICD-10-CM | POA: Diagnosis present

## 2015-01-04 LAB — CBC
HEMATOCRIT: 37 % (ref 35.0–47.0)
HEMATOCRIT: 37.3 % (ref 35.0–47.0)
HEMOGLOBIN: 11.9 g/dL — AB (ref 12.0–16.0)
HEMOGLOBIN: 11.6 g/dL — AB (ref 12.0–16.0)
MCH: 27.6 pg (ref 26.0–34.0)
MCH: 26.9 pg (ref 26.0–34.0)
MCHC: 32.1 g/dL (ref 32.0–36.0)
MCHC: 31.2 g/dL — AB (ref 32.0–36.0)
MCV: 86 fL (ref 80.0–100.0)
MCV: 86.2 fL (ref 80.0–100.0)
Platelets: 307 10*3/uL (ref 150–440)
Platelets: 306 10*3/uL (ref 150–440)
RBC: 4.31 MIL/uL (ref 3.80–5.20)
RBC: 4.33 MIL/uL (ref 3.80–5.20)
RDW: 15.8 % — ABNORMAL HIGH (ref 11.5–14.5)
RDW: 16.4 % — ABNORMAL HIGH (ref 11.5–14.5)
WBC: 7.1 10*3/uL (ref 3.6–11.0)
WBC: 5.1 10*3/uL (ref 3.6–11.0)

## 2015-01-04 LAB — BASIC METABOLIC PANEL
ANION GAP: 6 (ref 5–15)
BUN: 6 mg/dL (ref 6–20)
CHLORIDE: 111 mmol/L (ref 101–111)
CO2: 25 mmol/L (ref 22–32)
Calcium: 8.9 mg/dL (ref 8.9–10.3)
Creatinine, Ser: 0.59 mg/dL (ref 0.44–1.00)
GFR calc Af Amer: >60 mL/min (ref >60)
GLUCOSE: 105 mg/dL — AB (ref 65–99)
POTASSIUM: 3 mmol/L — AB (ref 3.5–5.1)
Sodium: 142 mmol/L (ref 135–145)

## 2015-01-04 MED ORDER — ZINC OXIDE 40 % EX OINT
1.0000 "application " | TOPICAL_OINTMENT | CUTANEOUS | Status: DC
  Administered 2015-01-04 – 2015-01-06 (×3): 1 via TOPICAL
  Filled 2015-01-04 (×2): qty 114

## 2015-01-04 MED ORDER — BENZONATATE 100 MG PO CAPS: 100.0000 mg | ORAL_CAPSULE | Freq: Three times a day (TID) | ORAL | Status: DC | PRN

## 2015-01-04 MED ORDER — VITAMIN D (ERGOCALCIFEROL) 1.25 MG (50000 UNIT) PO CAPS
50000.0000 [IU] | ORAL_CAPSULE | ORAL | Status: DC
  Administered 2015-01-04: 50000 [IU] via ORAL
  Filled 2015-01-04: qty 1

## 2015-01-04 MED ORDER — PANTOPRAZOLE SODIUM 40 MG PO TBEC
40.0000 mg | DELAYED_RELEASE_TABLET | ORAL | Status: DC
  Administered 2015-01-04 – 2015-01-06 (×3): 40 mg via ORAL
  Filled 2015-01-04 (×3): qty 1

## 2015-01-04 MED ORDER — ACETAMINOPHEN 325 MG PO TABS
650.0000 mg | ORAL_TABLET | Freq: Once | ORAL | Status: AC
  Administered 2015-01-04: 650 mg via ORAL
  Filled 2015-01-04: qty 2

## 2015-01-04 MED ORDER — LIDOCAINE 5 % EX PTCH
1.0000 | MEDICATED_PATCH | Freq: Every day | CUTANEOUS | Status: DC
  Administered 2015-01-04: 1 via TRANSDERMAL
  Filled 2015-01-04 (×3): qty 1

## 2015-01-04 MED ORDER — ATORVASTATIN CALCIUM 20 MG PO TABS
40.0000 mg | ORAL_TABLET | Freq: Every day | ORAL | Status: DC
  Administered 2015-01-04 – 2015-01-05 (×2): 40 mg via ORAL
  Filled 2015-01-04 (×2): qty 2

## 2015-01-04 MED ORDER — PREDNISONE 20 MG PO TABS
30.0000 mg | ORAL_TABLET | Freq: Every day | ORAL | Status: DC
  Administered 2015-01-04 – 2015-01-06 (×3): 30 mg via ORAL
  Filled 2015-01-04 (×3): qty 1

## 2015-01-04 MED ORDER — TIOTROPIUM BROMIDE MONOHYDRATE 18 MCG IN CAPS
18.0000 ug | ORAL_CAPSULE | Freq: Every day | RESPIRATORY_TRACT | Status: DC
  Administered 2015-01-04 – 2015-01-06 (×3): 18 ug via RESPIRATORY_TRACT
  Filled 2015-01-04: qty 5

## 2015-01-04 MED ORDER — SODIUM CHLORIDE 0.9 % IV SOLN: Freq: Once | INTRAVENOUS | Status: DC

## 2015-01-04 MED ORDER — POTASSIUM CHLORIDE CRYS ER 20 MEQ PO TBCR
20.0000 meq | EXTENDED_RELEASE_TABLET | Freq: Once | ORAL | Status: AC
  Administered 2015-01-04: 20 meq via ORAL
  Filled 2015-01-04: qty 1

## 2015-01-04 MED ORDER — POLYVINYL ALCOHOL 1.4 % OP SOLN
1.0000 [drp] | Freq: Four times a day (QID) | OPHTHALMIC | Status: DC | PRN
  Filled 2015-01-04: qty 15

## 2015-01-04 MED ORDER — VENLAFAXINE HCL ER 75 MG PO CP24
75.0000 mg | ORAL_CAPSULE | Freq: Every day | ORAL | Status: DC
  Administered 2015-01-04 – 2015-01-06 (×3): 75 mg via ORAL
  Filled 2015-01-04 (×3): qty 1

## 2015-01-04 MED ORDER — POLYETHYLENE GLYCOL 3350 17 G PO PACK
17.0000 g | PACK | Freq: Every evening | ORAL | Status: DC
  Administered 2015-01-05: 17 g via ORAL
  Filled 2015-01-04 (×3): qty 1

## 2015-01-04 MED ORDER — MICONAZOLE NITRATE 2 % EX POWD: 1.0000 "application " | CUTANEOUS | Status: DC

## 2015-01-04 MED ORDER — SALINE SPRAY 0.65 % NA SOLN
1.0000 | NASAL | Status: DC | PRN
  Filled 2015-01-04: qty 44

## 2015-01-04 MED ORDER — IBUPROFEN 400 MG PO TABS
400.0000 mg | ORAL_TABLET | Freq: Four times a day (QID) | ORAL | Status: DC | PRN
  Administered 2015-01-04 – 2015-01-06 (×6): 400 mg via ORAL
  Filled 2015-01-04 (×6): qty 1

## 2015-01-04 MED ORDER — SENNA 8.6 MG PO TABS
1.0000 | ORAL_TABLET | Freq: Every evening | ORAL | Status: DC
  Administered 2015-01-05: 8.6 mg via ORAL
  Filled 2015-01-04 (×2): qty 1

## 2015-01-04 MED ORDER — PREGABALIN 25 MG PO CAPS
50.0000 mg | ORAL_CAPSULE | Freq: Two times a day (BID) | ORAL | Status: DC
  Administered 2015-01-04: 50 mg via ORAL
  Filled 2015-01-04 (×2): qty 2

## 2015-01-04 MED ORDER — ALBUTEROL SULFATE (2.5 MG/3ML) 0.083% IN NEBU: 2.5000 mg | INHALATION_SOLUTION | RESPIRATORY_TRACT | Status: DC | PRN

## 2015-01-04 MED ORDER — ALBUTEROL (5 MG/ML) CONTINUOUS INHALATION SOLN: 10.0000 mg/h | INHALATION_SOLUTION | Freq: Once | RESPIRATORY_TRACT | Status: AC

## 2015-01-04 MED ORDER — METHYLPREDNISOLONE SODIUM SUCC 125 MG IJ SOLR
125.0000 mg | Freq: Once | INTRAMUSCULAR | Status: AC
  Administered 2015-01-04: 125 mg via INTRAVENOUS
  Filled 2015-01-04: qty 2

## 2015-01-04 MED ORDER — LOPERAMIDE HCL 2 MG PO CAPS: 2.0000 mg | ORAL_CAPSULE | ORAL | Status: DC | PRN

## 2015-01-04 MED ORDER — ALBUTEROL SULFATE (2.5 MG/3ML) 0.083% IN NEBU
INHALATION_SOLUTION | RESPIRATORY_TRACT | Status: AC
  Administered 2015-01-04: 10 mg
  Filled 2015-01-04: qty 12

## 2015-01-04 MED ORDER — METFORMIN HCL 500 MG PO TABS: 500.0000 mg | ORAL_TABLET | Freq: Every day | ORAL | Status: DC

## 2015-01-04 MED ORDER — DICLOFENAC SODIUM 1 % TD GEL
2.0000 g | Freq: Three times a day (TID) | TRANSDERMAL | Status: DC | PRN
  Filled 2015-01-04: qty 100

## 2015-01-04 MED ORDER — ACETAMINOPHEN 500 MG PO TABS
500.0000 mg | ORAL_TABLET | Freq: Three times a day (TID) | ORAL | Status: DC
  Administered 2015-01-04 – 2015-01-06 (×7): 500 mg via ORAL
  Filled 2015-01-04 (×7): qty 1

## 2015-01-04 MED ORDER — CARVEDILOL 6.25 MG PO TABS
3.1250 mg | ORAL_TABLET | Freq: Two times a day (BID) | ORAL | Status: DC
  Administered 2015-01-04 – 2015-01-06 (×5): 3.125 mg via ORAL
  Filled 2015-01-04 (×5): qty 1

## 2015-01-04 MED ORDER — NYSTATIN 100000 UNIT/GM EX POWD
CUTANEOUS | Status: DC
  Administered 2015-01-05 – 2015-01-06 (×2): via TOPICAL
  Filled 2015-01-04: qty 15

## 2015-01-04 MED ORDER — MORPHINE SULFATE (PF) 2 MG/ML IV SOLN: 2.0000 mg | INTRAVENOUS | Status: DC | PRN

## 2015-01-04 MED ORDER — TRAZODONE HCL 100 MG PO TABS
100.0000 mg | ORAL_TABLET | Freq: Every day | ORAL | Status: DC
  Administered 2015-01-04 – 2015-01-05 (×2): 100 mg via ORAL
  Filled 2015-01-04 (×2): qty 1

## 2015-01-04 MED ORDER — ALUM & MAG HYDROXIDE-SIMETH 400-400-40 MG/5ML PO SUSP
15.0000 mL | Freq: Four times a day (QID) | ORAL | Status: DC | PRN
  Filled 2015-01-04: qty 15

## 2015-01-04 MED ORDER — CAMPHOR-MENTHOL 0.5-0.5 % EX LOTN
1.0000 "application " | TOPICAL_LOTION | Freq: Four times a day (QID) | CUTANEOUS | Status: DC | PRN
  Filled 2015-01-04: qty 222

## 2015-01-04 MED ORDER — OXYBUTYNIN CHLORIDE ER 10 MG PO TB24
10.0000 mg | ORAL_TABLET | Freq: Every day | ORAL | Status: DC
  Administered 2015-01-04 – 2015-01-05 (×2): 10 mg via ORAL
  Filled 2015-01-04 (×2): qty 1

## 2015-01-04 MED ORDER — BENZOCAINE (TOPICAL) 20 % EX AERO: 1.0000 "application " | INHALATION_SPRAY | Freq: Three times a day (TID) | CUTANEOUS | Status: DC | PRN

## 2015-01-04 MED ORDER — PREGABALIN 75 MG PO CAPS
150.0000 mg | ORAL_CAPSULE | Freq: Two times a day (BID) | ORAL | Status: DC
  Administered 2015-01-04 (×2): 150 mg via ORAL
  Filled 2015-01-04 (×2): qty 2

## 2015-01-04 MED ORDER — HEPARIN SODIUM (PORCINE) 5000 UNIT/ML IJ SOLN: 5000.0000 [IU] | Freq: Three times a day (TID) | INTRAMUSCULAR | Status: DC

## 2015-01-04 MED ORDER — DOCUSATE SODIUM 100 MG PO CAPS: 100.0000 mg | ORAL_CAPSULE | Freq: Every day | ORAL | Status: DC | PRN

## 2015-01-04 MED ORDER — MELATONIN 3 MG PO TABS: 1.0000 | ORAL_TABLET | Freq: Every day | ORAL | Status: DC

## 2015-01-04 NOTE — ED Notes (Signed)
Pt request brief, pt assisted into brief.

## 2015-01-04 NOTE — H&P (Signed)
Emily Kidd is an 64 y.o. female.   Chief Complaint: Shortness of breath HPI: The patient presents emergency department complaining of shortness of breath. Past medical history is significant for COPD but most recently her biggest issue has been hemoptysis. She was admitted to the hospital last month for evaluation of what appears to be a fungal ball in her left lung. She was hypoxic after her bronchoscopy at that time but was discharged home without oxygen. Today in the emergency department she is oxygen dependent and has oxygen saturations 85% on room air. She continues to have scant hemoptysis. Emergency department chest x-ray showed a stable lung mass. The patient is also hemodynamically stable. Due to her oxygen requirement the emergency department called for admission.  Past Medical History  Diagnosis Date  . Hypertension   . COPD (chronic obstructive pulmonary disease) ([place])   . Pneumonia   . Sepsis ([place])   . Lung mass   . Diabetes mellitus without complication ([place])   . Dysrhythmia     TACHYCARDIA  . GERD (gastroesophageal reflux disease)   . Mitral regurgitation   . Carotid artery stenosis   . Muscle spasms of neck   . TIA (transient ischemic attack)   . Anxiety   . Hyperlipidemia   . Major depressive disorder ([place])   . Chronic pain syndrome   . Drug abuse in remission   . Vertigo   . Stable angina (H[facility])   . Neuropathy ([place])   . Left-sided weakness     FROM TIA    Past Surgical History  Procedure Laterality Date  . Endobronchial ultrasound N/A 11/24/2014    Procedure: ENDOBRONCHIAL ULTRASOUND;  Surgeon: Flora Lipps, MD;  Location: [facility] ORS;  Service: Cardiopulmonary;  Laterality: N/A;  . Above knee amputation Left   . Endobronchial ultrasound N/A 12/15/2014    Procedure: ENDOBRONCHIAL ULTRASOUND;  Surgeon: Flora Lipps, MD;  Location: [facility] ORS;  Service: Cardiopulmonary;  Laterality: N/A;    Family History  Problem Relation Age of Onset  . Heart attack Father   .  Stroke Father    Social History:  reports that she has quit smoking. She does not have any smokeless tobacco history on file. She reports that she uses illicit drugs (MDMA (Ecstacy)). She reports that she does not drink alcohol.  Allergies:  Allergies  Allergen Reactions  . Azithromycin   . Penicillins Rash    Prior to Admission medications   Medication Sig Start Date End Date Taking? Authorizing Provider  acetaminophen (TYLENOL) 650 MG CR tablet Take 650 mg by mouth 3 (three) times daily.   Yes Historical Provider, MD  albuterol (PROVENTIL) (2.5 MG/3ML) 0.083% nebulizer solution Take 2.5 mg by nebulization every 4 (four) hours as needed for shortness of breath.    Yes Historical Provider, MD  alum & mag hydroxide-simeth (MAALOX ADVANCED MAX ST) 809-983-38 MG/5ML suspension Take 15 mLs by mouth 4 (four) times daily as needed (nausea).    Yes Historical Provider, MD  atorvastatin (LIPITOR) 40 MG tablet Take 40 mg by mouth at bedtime.   Yes Historical Provider, MD  benzocaine (HURRICAINE) 20 % GEL Use as directed 1 application in the mouth or throat 3 (three) times daily as needed (for pain).    Yes Historical Provider, MD  benzonatate (TESSALON) 100 MG capsule Take 100 mg by mouth 3 (three) times daily as needed for cough.   Yes Historical Provider, MD  camphor-menthol Timoteo Ace) lotion Apply 1 application topically 4 (four) times daily as needed  for itching.   Yes Historical Provider, MD  Carboxymethylcellul-Glycerin (OPTIVE) 0.5-0.9 % SOLN Apply 1 drop to eye See admin instructions. 3 to 4 times daily as needed for dryness/irritation   Yes Historical Provider, MD  carvedilol (COREG) 3.125 MG tablet Take 3.125 mg by mouth 2 (two) times daily.    Yes Historical Provider, MD  Diaper Rash Products (DESITIN) OINT Apply 1 application topically every morning. To buttocks   Yes Historical Provider, MD  diclofenac sodium (VOLTAREN) 1 % GEL Apply 2 g topically every 8 (eight) hours as needed (for pain).     Yes Historical Provider, MD  docusate sodium (COLACE) 100 MG capsule Take 100 mg by mouth daily as needed for mild constipation.   Yes Historical Provider, MD  lidocaine (LIDODERM) 5 % Place 1 patch onto the skin at bedtime. Remove & Discard patch within 12 hours or as directed by MD   Yes Historical Provider, MD  loperamide (IMODIUM A-D) 2 MG capsule Take 2 mg by mouth as needed for diarrhea or loose stools (2 by mouth after 1st loose stool, then 1 following each subsequent loose stool).   Yes Historical Provider, MD  Melatonin 3 MG TABS Take 1 tablet by mouth at bedtime.   Yes Historical Provider, MD  metFORMIN (GLUCOPHAGE) 500 MG tablet Take 500 mg by mouth daily.   Yes Historical Provider, MD  miconazole (ZEASORB-AF) 2 % powder Apply 1 application topically every morning. To thoroughly dry groin after bath   Yes Historical Provider, MD  Omega-3 Fatty Acids (OMEGA-3 FISH OIL PO) Take 2 capsules by mouth daily.   Yes Historical Provider, MD  oxybutynin (DITROPAN-XL) 10 MG 24 hr tablet Take 10 mg by mouth at bedtime.    Yes Historical Provider, MD  pantoprazole (PROTONIX) 40 MG tablet Take 40 mg by mouth every morning.    Yes Historical Provider, MD  polyethylene glycol (MIRALAX / GLYCOLAX) packet Take 17 g by mouth every evening.    Yes Historical Provider, MD  predniSONE (DELTASONE) 10 MG tablet Take 3 tablets (30 mg total) by mouth daily with breakfast. 12/16/14  Yes Loletha Grayer, MD  pregabalin (LYRICA) 150 MG capsule Take 150 mg by mouth 2 (two) times daily. Along with lyrica 50 mg   Yes Historical Provider, MD  pregabalin (LYRICA) 200 MG capsule Take 200 mg by mouth.   Yes Historical Provider, MD  pregabalin (LYRICA) 50 MG capsule Take 50 mg by mouth 2 (two) times daily. Along with Lyrica 150 mg   Yes Historical Provider, MD  senna (SENOKOT) 8.6 MG TABS tablet Take 1 tablet by mouth every evening.    Yes Historical Provider, MD  sodium chloride (OCEAN) 0.65 % SOLN nasal spray Place 1-2  sprays into both nostrils as needed for congestion.   Yes Historical Provider, MD  tiotropium (SPIRIVA HANDIHALER) 18 MCG inhalation capsule Place 18 mcg into inhaler and inhale daily.   Yes Historical Provider, MD  traZODone (DESYREL) 100 MG tablet Take 100 mg by mouth at bedtime.   Yes Historical Provider, MD  venlafaxine XR (EFFEXOR-XR) 75 MG 24 hr capsule Take 75 mg by mouth daily.   Yes Historical Provider, MD  Vitamin D, Ergocalciferol, (DRISDOL) 50000 UNITS CAPS capsule Take 50,000 Units by mouth every 30 (thirty) days. On the first Monday of each month   Yes Historical Provider, MD     Results for orders placed or performed during the hospital encounter of 01/04/15 (from the past 48 hour(s))  Basic metabolic panel  Status: Abnormal   Collection Time: 01/04/15  3:11 AM  Result Value Ref Range   Sodium 142 135 - 145 mmol/L   Potassium 3.0 (L) 3.5 - 5.1 mmol/L   Chloride 111 101 - 111 mmol/L   CO2 25 22 - 32 mmol/L   Glucose, Bld 105 (H) 65 - 99 mg/dL   BUN 6 6 - 20 mg/dL   Creatinine, Ser 0.59 0.44 - 1.00 mg/dL   Calcium 8.9 8.9 - 10.3 mg/dL   GFR calc non Af Amer >60 >60 mL/min   GFR calc Af Amer >60 >60 mL/min    Comment: (NOTE) The eGFR has been calculated using the CKD EPI equation. This calculation has not been validated in all clinical situations. eGFR's persistently <60 mL/min signify possible Chronic Kidney Disease.    Anion gap 6 5 - 15  CBC     Status: Abnormal   Collection Time: 01/04/15  3:11 AM  Result Value Ref Range   WBC 7.1 3.6 - 11.0 K/uL   RBC 4.31 3.80 - 5.20 MIL/uL   Hemoglobin 11.9 (L) 12.0 - 16.0 g/dL   HCT 37.0 35.0 - 47.0 %   MCV 86.0 80.0 - 100.0 fL   MCH 27.6 26.0 - 34.0 pg   MCHC 32.1 32.0 - 36.0 g/dL   RDW 15.8 (H) 11.5 - 14.5 %   Platelets 307 150 - 440 K/uL   Dg Chest Portable 1 View  01/04/2015  CLINICAL DATA:  Dyspnea and cough for a month. EXAM: PORTABLE CHEST 1 VIEW COMPARISON:  12/15/2014 FINDINGS: There is dense consolidation in  the left upper lobe. There is partial clearance of left base opacity since the prior study. The right lung remains clear. No large effusion. IMPRESSION: Persistent dense left upper lobe consolidation. Electronically Signed   By: Andreas Newport M.D.   On: 01/04/2015 04:14    Review of Systems  Constitutional: Negative for fever and chills.  HENT: Negative for sore throat and tinnitus.   Eyes: Negative for blurred vision and redness.  Respiratory: Positive for cough, hemoptysis and shortness of breath.   Cardiovascular: Negative for chest pain, palpitations, orthopnea and PND.  Gastrointestinal: Negative for nausea, vomiting, abdominal pain and diarrhea.  Genitourinary: Negative for dysuria, urgency and frequency.  Musculoskeletal: Negative for myalgias and joint pain.  Skin: Negative for rash.       No lesions  Neurological: Negative for speech change, focal weakness and weakness.  Endo/Heme/Allergies: Does not bruise/bleed easily.       No temperature intolerance  Psychiatric/Behavioral: Negative for depression and suicidal ideas.    Blood pressure 126/98, pulse 80, temperature 98.2 F (36.8 C), temperature source Oral, resp. rate 23, height 5' 4"  (1.626 m), weight 73.483 kg (162 lb), SpO2 94 %. Physical Exam  Nursing note and vitals reviewed. Constitutional: She is oriented to person, place, and time. She appears well-developed and well-nourished. No distress.  HENT:  Head: Normocephalic and atraumatic.  Mouth/Throat: Oropharynx is clear and moist.  Eyes: Conjunctivae and EOM are normal. Pupils are equal, round, and reactive to light. No scleral icterus.  Neck: Normal range of motion. Neck supple. No JVD present. No tracheal deviation present. No thyromegaly present.  Cardiovascular: Normal rate, regular rhythm and normal heart sounds.  Exam reveals no gallop and no friction rub.   No murmur heard. Respiratory: Effort normal. She has decreased breath sounds in the left upper  field.  Transmitted upper airway noises  GI: Soft. Bowel sounds are normal. She exhibits  no distension. There is no tenderness.  Genitourinary:  Deferred  Musculoskeletal: Normal range of motion. She exhibits no edema.  Left leg amputation; left upper extremity contracture  Lymphadenopathy:    She has no cervical adenopathy.  Neurological: She is alert and oriented to person, place, and time. No cranial nerve deficit. She exhibits normal muscle tone.  Skin: Skin is warm and dry. No rash noted. No erythema.  Psychiatric: She has a normal mood and affect. Her behavior is normal. Judgment and thought content normal.     Assessment/Plan This is a 64 year old African American female admitted for shortness of breath and hemoptysis. 1. Acute on chronic respiratory failure with hypoxia: Etiology is multifactorial including hemoptysis from the presumed fungal ball as well as COPD. Continue inhalers per home regimen as well as daily oral steroids (non-tapering dose). Hemoptysis is not new although patient states that the rattling in her chest is. Pulmonary consult due to anemia and presumed ongoing bleed secondary to lung mass. 2. Peripheral neuropathy: Continue pregabalin dosing per home regimen 3. Diabetes mellitus type 2: Hold oral hypoglycemics. Sliding scale insulin while the patient is hospitalized 4. Anemia: Secondary to hemoptysis. Prepare transfusion of 2 units RBCs. 5. DVT prophylaxis: SCDs 6. GI prophylaxis: None The patient is a full code. Time spent on admission orders and patient care approximately 45 minutes  Harrie Foreman 01/04/2015, 7:00 AM

## 2015-01-04 NOTE — ED Provider Notes (Signed)
Time Seen: Approximately 0 300  I have reviewed the triage notes  Chief Complaint: Shortness of Breath   History of Present Illness: Emily Kidd is a 64 y.o. female who was transported here by EMS for complaints of shortness of breath. Patient has a long history of COPD and gave herself breathing treatments at 7 PM and 11 PM. She states she started having significant shortness of breath that 8 PM. She's had some persistent mild hemoptysis and has a known lesion located in her left lung fields. This lesion has been biopsied with bronchoscopy on recent admission here to the hospital. She states that the lesion seems to be "" fungal "" and she says her trying to determine what particular medication would be effective. She denies any new leg pain or swelling. Patient was transported here by EMS uneventfully. Room air pulse ox was 85%  Patient's part of the [facility] program she states her primary physician is a Dr. Ovid Curd associated with [facility] Past Medical History  Diagnosis Date  . Hypertension   . COPD (chronic obstructive pulmonary disease) ([place])   . Pneumonia   . Sepsis ([place])   . Lung mass   . Diabetes mellitus without complication ([place])   . Dysrhythmia     TACHYCARDIA  . GERD (gastroesophageal reflux disease)   . Mitral regurgitation   . Carotid artery stenosis   . Muscle spasms of neck   . TIA (transient ischemic attack)   . Anxiety   . Hyperlipidemia   . Major depressive disorder ([place])   . Chronic pain syndrome   . Drug abuse in remission   . Vertigo   . Stable angina ([facility])   . Neuropathy ([place])   . Left-sided weakness     FROM TIA    Patient Active Problem List   Diagnosis Date Noted  . Hypoxia 12/15/2014  . Adenopathy   . Sepsis ([place]) 11/21/2014  . Acute respiratory failure with hypoxia ([place]) 11/21/2014  . Encephalopathy, metabolic 12/45/8099  . Left upper lobe pneumonia 11/21/2014  . Hemoptysis 11/21/2014  . Pneumonia 11/16/2014  . COPD (chronic  obstructive pulmonary disease) ([place]) 11/16/2014  . GERD (gastroesophageal reflux disease) 11/16/2014  . Peripheral neuropathy ([place]) 11/16/2014  . Peripheral vascular disease ([place]) 11/16/2014    Past Surgical History  Procedure Laterality Date  . Endobronchial ultrasound N/A 11/24/2014    Procedure: ENDOBRONCHIAL ULTRASOUND;  Surgeon: Flora Lipps, MD;  Location: [facility] ORS;  Service: Cardiopulmonary;  Laterality: N/A;  . Above knee amputation Left   . Endobronchial ultrasound N/A 12/15/2014    Procedure: ENDOBRONCHIAL ULTRASOUND;  Surgeon: Flora Lipps, MD;  Location: [facility] ORS;  Service: Cardiopulmonary;  Laterality: N/A;    Past Surgical History  Procedure Laterality Date  . Endobronchial ultrasound N/A 11/24/2014    Procedure: ENDOBRONCHIAL ULTRASOUND;  Surgeon: Flora Lipps, MD;  Location: [facility] ORS;  Service: Cardiopulmonary;  Laterality: N/A;  . Above knee amputation Left   . Endobronchial ultrasound N/A 12/15/2014    Procedure: ENDOBRONCHIAL ULTRASOUND;  Surgeon: Flora Lipps, MD;  Location: [facility] ORS;  Service: Cardiopulmonary;  Laterality: N/A;    Current Outpatient Rx  Name  Route  Sig  Dispense  Refill  . acetaminophen (TYLENOL) 650 MG CR tablet   Oral   Take 650 mg by mouth 3 (three) times daily.         Marland Kitchen albuterol (PROVENTIL) (2.5 MG/3ML) 0.083% nebulizer solution   Nebulization   Take 2.5 mg by nebulization every 4 (four)  hours as needed for shortness of breath.          Marland Kitchen alum & mag hydroxide-simeth (MAALOX ADVANCED MAX ST) 400-400-40 MG/5ML suspension   Oral   Take 15 mLs by mouth 4 (four) times daily as needed (nausea).          Marland Kitchen atorvastatin (LIPITOR) 40 MG tablet   Oral   Take 40 mg by mouth at bedtime.         . benzocaine (HURRICAINE) 20 % GEL   Mouth/Throat   Use as directed 1 application in the mouth or throat 3 (three) times daily as needed (for pain).          . benzonatate (TESSALON) 100 MG capsule   Oral   Take 100 mg by mouth 3 (three)  times daily as needed for cough.         . camphor-menthol (SARNA) lotion   Topical   Apply 1 application topically 4 (four) times daily as needed for itching.         . Carboxymethylcellul-Glycerin (OPTIVE) 0.5-0.9 % SOLN   Ophthalmic   Apply 1 drop to eye See admin instructions. 3 to 4 times daily as needed for dryness/irritation         . carvedilol (COREG) 3.125 MG tablet   Oral   Take 3.125 mg by mouth 2 (two) times daily.          . Diaper Rash Products (DESITIN) OINT   Topical   Apply 1 application topically every morning. To buttocks         . diclofenac sodium (VOLTAREN) 1 % GEL   Topical   Apply 2 g topically every 8 (eight) hours as needed (for pain).          Marland Kitchen docusate sodium (COLACE) 100 MG capsule   Oral   Take 100 mg by mouth daily as needed for mild constipation.         . lidocaine (LIDODERM) 5 %   Transdermal   Place 1 patch onto the skin at bedtime. Remove & Discard patch within 12 hours or as directed by MD         . loperamide (IMODIUM A-D) 2 MG capsule   Oral   Take 2 mg by mouth as needed for diarrhea or loose stools (2 by mouth after 1st loose stool, then 1 following each subsequent loose stool).         . Melatonin 3 MG TABS   Oral   Take 1 tablet by mouth at bedtime.         . metFORMIN (GLUCOPHAGE) 500 MG tablet   Oral   Take 500 mg by mouth daily.         . miconazole (ZEASORB-AF) 2 % powder   Topical   Apply 1 application topically every morning. To thoroughly dry groin after bath         . Omega-3 Fatty Acids (OMEGA-3 FISH OIL PO)   Oral   Take 2 capsules by mouth daily.         Marland Kitchen oxybutynin (DITROPAN-XL) 10 MG 24 hr tablet   Oral   Take 10 mg by mouth at bedtime.          . pantoprazole (PROTONIX) 40 MG tablet   Oral   Take 40 mg by mouth every morning.          . polyethylene glycol (MIRALAX / GLYCOLAX) packet   Oral   Take 17 g by  mouth every evening.          . predniSONE (DELTASONE) 10 MG  tablet   Oral   Take 3 tablets (30 mg total) by mouth daily with breakfast.   21 tablet   0   . pregabalin (LYRICA) 150 MG capsule   Oral   Take 150 mg by mouth 2 (two) times daily. Along with lyrica 50 mg         . pregabalin (LYRICA) 200 MG capsule   Oral   Take 200 mg by mouth.         . pregabalin (LYRICA) 50 MG capsule   Oral   Take 50 mg by mouth 2 (two) times daily. Along with Lyrica 150 mg         . senna (SENOKOT) 8.6 MG TABS tablet   Oral   Take 1 tablet by mouth every evening.          . sodium chloride (OCEAN) 0.65 % SOLN nasal spray   Each Nare   Place 1-2 sprays into both nostrils as needed for congestion.         Marland Kitchen tiotropium (SPIRIVA HANDIHALER) 18 MCG inhalation capsule   Inhalation   Place 18 mcg into inhaler and inhale daily.         . traZODone (DESYREL) 100 MG tablet   Oral   Take 100 mg by mouth at bedtime.         Marland Kitchen venlafaxine XR (EFFEXOR-XR) 75 MG 24 hr capsule   Oral   Take 75 mg by mouth daily.         . Vitamin D, Ergocalciferol, (DRISDOL) 50000 UNITS CAPS capsule   Oral   Take 50,000 Units by mouth every 30 (thirty) days. On the first Monday of each month           Allergies:  Azithromycin and Penicillins  Family History: Family History  Problem Relation Age of Onset  . Heart attack Father   . Stroke Father     Social History: Social History  Substance Use Topics  . Smoking status: Former Research scientist (life sciences)  . Smokeless tobacco: None  . Alcohol Use: No     Review of Systems:   10 point review of systems was performed and was otherwise negative:  Constitutional: No fever Eyes: No visual disturbances ENT: No sore throat, ear pain Cardiac: No chest pain Respiratory: No shortness of breath, wheezing, or stridor Abdomen: No abdominal pain, no vomiting, No diarrhea Endocrine: No weight loss, No night sweats Extremities: No peripheral edema, cyanosis Skin: No rashes, easy bruising Neurologic: No focal weakness,  trouble with speech or swollowing Urologic: No dysuria, Hematuria, or urinary frequency   Physical Exam:  ED Triage Vitals  Enc Vitals Group     BP 01/04/15 0304 164/79 mmHg     Pulse Rate 01/04/15 0304 82     Resp 01/04/15 0304 20     Temp 01/04/15 0304 98.2 F (36.8 C)     Temp Source 01/04/15 0304 Oral     SpO2 01/04/15 0304 96 %     Weight 01/04/15 0304 162 lb (73.483 kg)     Height 01/04/15 0304 '5\' 4"'$  (1.626 m)     Head Cir --      Peak Flow --      Pain Score 01/04/15 0307 10     Pain Loc --      Pain Edu? --      Excl. in [place]? --  General: Awake , Alert , and Oriented times 3; GCS 15 Head: Normal cephalic , atraumatic Eyes: Pupils equal , round, reactive to light Nose/Throat: No nasal drainage, patent upper airway without erythema or exudate.  Neck: Supple, Full range of motion, No anterior adenopathy or palpable thyroid masses Lungs: Coarse breath sounds are auscultated bilaterally with end expiratory wheezing heard at the apices and rolling rhonchi mostly at the bases. No rales are noted Heart: Regular rate, regular rhythm without murmurs , gallops , or rubs Abdomen: Soft, non tender without rebound, guarding , or rigidity; bowel sounds positive and symmetric in all 4 quadrants. No organomegaly .        Extremities: Previous amputation of her left lower extremity. No new edema etc. in the right lower extremity Neurologic: normal ambulation, Motor symmetric without deficits, sensory intact Skin: warm, dry, no rashes   Labs:   All laboratory work was reviewed including any pertinent negatives or positives listed below:  Labs Reviewed  BASIC METABOLIC PANEL - Abnormal; Notable for the following:    Potassium 3.0 (*)    Glucose, Bld 105 (*)    All other components within normal limits  CBC - Abnormal; Notable for the following:    Hemoglobin 11.9 (*)    RDW 15.8 (*)    All other components within normal limits       Radiology:    CLINICAL DATA:  Dyspnea and cough for a month.  EXAM: PORTABLE CHEST 1 VIEW  COMPARISON: 12/15/2014  FINDINGS: There is dense consolidation in the left upper lobe. There is partial clearance of left base opacity since the prior study. The right lung remains clear. No large effusion.  IMPRESSION: Persistent dense left upper lobe consolidation.    I personally reviewed the radiologic studies   P ED Course:  Patient was given some supplemental potassium for mild hypo-daily me in. This may be related to the breathing treatments that she's received up to this point. Patient was given IV Solu-Medrol and was placed on a 1 hour continuous nebulization. Completion she was tested on room air and still dropped into the high 80s and was placed on her 2 L nasal cannula which seemed to stabilize her pulse oximetry. Patient states she feels improved but not well enough to go home at this point. Her lesion on her chest x-ray appears to be consistent and does not seem to be suffering any consequences from her hemoptysis at this point.    Assessment:  Acute exacerbation of chronic obstructive pulmonary disease   Final Clinical Impression:   Final diagnoses:  Chronic obstructive pulmonary disease with acute exacerbation [facility])     Plan:  Inpatient management I reviewed the case with the hospitalist team, further disposition and management depends upon their evaluation            Daymon Larsen, MD 01/04/15 [phone]

## 2015-01-04 NOTE — Progress Notes (Signed)
PHARMACIST - PHYSICIAN ORDER COMMUNICATION  CONCERNING: P&T Medication Policy on Herbal Medications  DESCRIPTION:  This patient's order for:  melatonin  has been noted.  This product(s) is classified as an "herbal" or natural product. Due to a lack of definitive safety studies or FDA approval, nonstandard manufacturing practices, plus the potential risk of unknown drug-drug interactions while on inpatient medications, the Pharmacy and Therapeutics Committee does not permit the use of "herbal" or natural products of this type within [facility].   ACTION TAKEN: The pharmacy department is unable to verify this order at this time and your patient has been informed of this safety policy. Please reevaluate patient's clinical condition at discharge and address if the herbal or natural product(s) should be resumed at that time.   Ulice Dash, PharmD

## 2015-01-04 NOTE — ED Notes (Signed)
Pt oxygen changed to room air to assess if pt can maintain o2sat w/o supplement since no o2 at home.  After a few minutes, pt's o2sat drop to 87%.  Pt placed on 2L via [place] and MD informed.

## 2015-01-04 NOTE — ED Notes (Addendum)
Pt to rm 26 via EMS from home.  Pt reports SOB starting 9pm, used nebulizer w/ limited relief.  Pt hx COPD.  Pt reports recent illness w/ cough, productive with bloody sputum.  EMS report pt with o2sat in low 90s at RA, pt on 2L [place] upon arrival, o2 in upper 90s.  Pt reports HA.  Pt NAD upon arrival, respirations equal, mildly labored, sounds congested, skin warm and dry.    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

## 2015-01-04 NOTE — ED Notes (Signed)
RT at bedside.

## 2015-01-04 NOTE — ED Notes (Signed)
Pt's nebulizer since running.  Pt states she is comfortable at this time and headache is better.

## 2015-01-04 NOTE — Care Management Obs Status (Signed)
[facility] NOTIFICATION   Patient Details  Name: Emily Kidd MRN: [id number] Date of Birth: April 22, 1950   Medicare Observation Status Notification Given:  Yes (reviewed with patient, original to patient, copy to HIM scanning)    Ival Bible, RN 01/04/2015, 5:36 PM

## 2015-01-05 LAB — CBC
HEMATOCRIT: 33.7 % — AB (ref 35.0–47.0)
Hemoglobin: 11 g/dL — ABNORMAL LOW (ref 12.0–16.0)
MCH: 27.9 pg (ref 26.0–34.0)
MCHC: 32.5 g/dL (ref 32.0–36.0)
MCV: 85.8 fL (ref 80.0–100.0)
Platelets: 292 10*3/uL (ref 150–440)
RBC: 3.93 MIL/uL (ref 3.80–5.20)
RDW: 16.2 % — AB (ref 11.5–14.5)
WBC: 10.2 10*3/uL (ref 3.6–11.0)

## 2015-01-05 LAB — BASIC METABOLIC PANEL
ANION GAP: 6 (ref 5–15)
BUN: <5 mg/dL — ABNORMAL LOW (ref 6–20)
CALCIUM: 8.8 mg/dL — AB (ref 8.9–10.3)
CO2: 26 mmol/L (ref 22–32)
Chloride: 110 mmol/L (ref 101–111)
Creatinine, Ser: 0.58 mg/dL (ref 0.44–1.00)
Glucose, Bld: 123 mg/dL — ABNORMAL HIGH (ref 65–99)
Potassium: 3.7 mmol/L (ref 3.5–5.1)
SODIUM: 142 mmol/L (ref 135–145)

## 2015-01-05 LAB — TROPONIN I: Troponin I: <0.03 ng/mL (ref <0.031)

## 2015-01-05 MED ORDER — INFLUENZA VAC SPLIT QUAD 0.5 ML IM SUSY: 0.5000 mL | PREFILLED_SYRINGE | INTRAMUSCULAR | Status: DC

## 2015-01-05 MED ORDER — PREGABALIN 75 MG PO CAPS
200.0000 mg | ORAL_CAPSULE | Freq: Two times a day (BID) | ORAL | Status: DC
  Administered 2015-01-05 – 2015-01-06 (×3): 200 mg via ORAL
  Filled 2015-01-05 (×3): qty 2

## 2015-01-05 MED ORDER — IPRATROPIUM-ALBUTEROL 0.5-2.5 (3) MG/3ML IN SOLN
3.0000 mL | Freq: Four times a day (QID) | RESPIRATORY_TRACT | Status: DC
  Administered 2015-01-05 – 2015-01-06 (×4): 3 mL via RESPIRATORY_TRACT
  Filled 2015-01-05 (×4): qty 3

## 2015-01-05 MED ORDER — PNEUMOCOCCAL VAC POLYVALENT 25 MCG/0.5ML IJ INJ: 0.5000 mL | INJECTION | INTRAMUSCULAR | Status: DC

## 2015-01-05 MED ORDER — NITROGLYCERIN 0.4 MG SL SUBL
0.4000 mg | SUBLINGUAL_TABLET | SUBLINGUAL | Status: DC | PRN
  Administered 2015-01-05: 0.4 mg via SUBLINGUAL
  Filled 2015-01-05: qty 1

## 2015-01-05 MED ORDER — ASPIRIN 81 MG PO CHEW
324.0000 mg | CHEWABLE_TABLET | Freq: Once | ORAL | Status: AC
  Administered 2015-01-05: 324 mg via ORAL
  Filled 2015-01-05: qty 4

## 2015-01-05 MED ORDER — CLINDAMYCIN HCL 150 MG PO CAPS
300.0000 mg | ORAL_CAPSULE | Freq: Three times a day (TID) | ORAL | Status: DC
  Administered 2015-01-05 – 2015-01-06 (×3): 300 mg via ORAL
  Filled 2015-01-05 (×3): qty 2

## 2015-01-05 NOTE — Consult Note (Signed)
[facility] Infectious Disease     Reason for Consult: PNA, strep mitis bacteremia   Referring Physician: Seth Bake Date of Admission:  01/04/2015   Active Problems:   Hypoxia  HPI: Emily Kidd is a 64 y.o. female with a hx of COPD, followed by Dr Raul Del for abnormal CT scan chest, prior CVA, Htn, prior tobacco use who was admitted initially  10/16 with several weeks of increasing cough, hemoptysis and chest pain, sob.  Pt had been dxed with PNA as otpt and started on levofloxacin however sxs persisted and came to ED. CT done since admit shows infiltrate, as well as nodularity with one cavitary nodule (of note no comparison was done to prior CTs which have been abnormal.  She had bcx with Strep Mitis and was dischaged on 10/21 on oral clindamycin.  She had bronch done 11/14 and had one day admission following that for hypoxa - treated with pred taper. Of note her BAL specimen is neg for routine cx (nml flora), AFB and fungal with just C glabrata.  US guided bxp of visible subcarinal LN was negative for malignacny Her CT scan continues to show LUL infiltrate and CXR shows today that it persists. She has not been on any abx as otpt.   Readmitted 12/4 with increasing SOB and hypoxia. On admit was afebrile, wbc nml.  No further hemoptysis.  Denies NS or fevers at home. Prior smoker but quit 2011 when had a CVA. Does report some dysphagia with foods since CVA. 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 Denies known TB contacts.   Past Medical History  Diagnosis Date  . Hypertension   . COPD (chronic obstructive pulmonary disease) ([place])   . Pneumonia   . Sepsis ([place])   . Lung mass   . Diabetes mellitus without complication ([place])   . Dysrhythmia     TACHYCARDIA  . GERD (gastroesophageal reflux disease)   . Mitral regurgitation   . Carotid artery stenosis   . Muscle spasms of neck   . TIA (transient ischemic attack)   . Anxiety   .  Hyperlipidemia   . Major depressive disorder ([place])   . Chronic pain syndrome   . Drug abuse in remission   . Vertigo   . Stable angina ([facility])   . Neuropathy ([place])   . Left-sided weakness     FROM TIA   Past Surgical History  Procedure Laterality Date  . Endobronchial ultrasound N/A 11/24/2014    Procedure: ENDOBRONCHIAL ULTRASOUND;  Surgeon: Flora Lipps, MD;  Location: [facility] ORS;  Service: Cardiopulmonary;  Laterality: N/A;  . Above knee amputation Left   . Endobronchial ultrasound N/A 12/15/2014    Procedure: ENDOBRONCHIAL ULTRASOUND;  Surgeon: Flora Lipps, MD;  Location: [facility] ORS;  Service: Cardiopulmonary;  Laterality: N/A;   Social History  Substance Use Topics  . Smoking status: Former Research scientist (life sciences)  . Smokeless tobacco: None  . Alcohol Use: No   Family History  Problem Relation Age of Onset  . Heart attack Father   . Stroke Father     Allergies:  Allergies  Allergen Reactions  . Azithromycin   . Penicillins Rash    Current antibiotics: Antibiotics Given (last 72 hours)    None      MEDICATIONS: . acetaminophen  500 mg Oral TID  . atorvastatin  40 mg Oral QHS  . carvedilol  3.125 mg Oral BID  . ipratropium-albuterol  3 mL Nebulization Q6H  . lidocaine  1 patch Transdermal QHS  . liver oil-zinc oxide  1 application Topical VC-B4W  . nystatin   Topical BH-q7a  . oxybutynin  10 mg Oral QHS  . pantoprazole  40 mg Oral BH-q7a  . polyethylene glycol  17 g Oral QPM  . predniSONE  30 mg Oral Q breakfast  . pregabalin  200 mg Oral BID  . senna  1 tablet Oral QPM  . tiotropium  18 mcg Inhalation Daily  . traZODone  100 mg Oral QHS  . venlafaxine XR  75 mg Oral Daily  . Vitamin D (Ergocalciferol)  50,000 Units Oral Q30 days    Review of Systems - 11 systems reviewed and negative per HPI   OBJECTIVE: Temp:  [98.1 F (36.7 C)-98.4 F (36.9 C)] 98.1 F (36.7 C) (12/05 1421) Pulse Rate:  [81-111] 111 (12/05 1421) Resp:  [17-18] 17 (12/05 1421) BP:  (118-146)/(51-88) 131/88 mmHg (12/05 1421) SpO2:  [92 %-96 %] 92 % (12/05 1421) Weight:  [74.39 kg (164 lb)] 74.39 kg (164 lb) (12/05 0500). Physical Exam  Constitutional:  oriented to person, place, and time. appears well-developed and well-nourished. No distress.  HENT: [place]/AT, PERRLA, no scleral icterus Mouth/Throat: Oropharynx is clear and moist. No oropharyngeal exudate.  Cardiovascular: Normal rate, regular rhythm and normal heart sounds.  Pulmonary/Chest: poor air movement  Neck = supple, no nuchal rigidity no lan Abdominal: Soft. Bowel sounds are normal.  exhibits no distension. There is no tenderness.  Lymphadenopathy: no cervical adenopathy. No axillary adenopathy Neurological: alert and oriented to person, place, and time.  Skin: Skin is warm and dry. No rash noted. No erythema.  Psychiatric: a normal mood and affect.  behavior is normal.    LABS: Results for orders placed or performed during the hospital encounter of 01/04/15 (from the past 48 hour(s))  Basic metabolic panel     Status: Abnormal   Collection Time: 01/04/15  3:11 AM  Result Value Ref Range   Sodium 142 135 - 145 mmol/L   Potassium 3.0 (L) 3.5 - 5.1 mmol/L   Chloride 111 101 - 111 mmol/L   CO2 25 22 - 32 mmol/L   Glucose, Bld 105 (H) 65 - 99 mg/dL   BUN 6 6 - 20 mg/dL   Creatinine, Ser 0.59 0.44 - 1.00 mg/dL   Calcium 8.9 8.9 - 10.3 mg/dL   GFR calc non Af Amer >60 >60 mL/min   GFR calc Af Amer >60 >60 mL/min    Comment: (NOTE) The eGFR has been calculated using the CKD EPI equation. This calculation has not been validated in all clinical situations. eGFR's persistently <60 mL/min signify possible Chronic Kidney Disease.    Anion gap 6 5 - 15  CBC     Status: Abnormal   Collection Time: 01/04/15  3:11 AM  Result Value Ref Range   WBC 7.1 3.6 - 11.0 K/uL   RBC 4.31 3.80 - 5.20 MIL/uL   Hemoglobin 11.9 (L) 12.0 - 16.0 g/dL   HCT 37.0 35.0 - 47.0 %   MCV 86.0 80.0 - 100.0 fL   MCH 27.6 26.0 - 34.0  pg   MCHC 32.1 32.0 - 36.0 g/dL   RDW 15.8 (H) 11.5 - 14.5 %   Platelets 307 150 - 440 K/uL  CBC     Status: Abnormal   Collection Time: 01/04/15 12:24 PM  Result Value Ref Range   WBC 5.1 3.6 - 11.0 K/uL   RBC  4.33 3.80 - 5.20 MIL/uL   Hemoglobin 11.6 (L) 12.0 - 16.0 g/dL   HCT 37.3 35.0 - 47.0 %   MCV 86.2 80.0 - 100.0 fL   MCH 26.9 26.0 - 34.0 pg   MCHC 31.2 (L) 32.0 - 36.0 g/dL   RDW 16.4 (H) 11.5 - 14.5 %   Platelets 306 150 - 440 K/uL  CBC     Status: Abnormal   Collection Time: 01/05/15  5:51 AM  Result Value Ref Range   WBC 10.2 3.6 - 11.0 K/uL   RBC 3.93 3.80 - 5.20 MIL/uL   Hemoglobin 11.0 (L) 12.0 - 16.0 g/dL   HCT 33.7 (L) 35.0 - 47.0 %   MCV 85.8 80.0 - 100.0 fL   MCH 27.9 26.0 - 34.0 pg   MCHC 32.5 32.0 - 36.0 g/dL   RDW 16.2 (H) 11.5 - 14.5 %   Platelets 292 150 - 440 K/uL  Basic metabolic panel     Status: Abnormal   Collection Time: 01/05/15  5:51 AM  Result Value Ref Range   Sodium 142 135 - 145 mmol/L   Potassium 3.7 3.5 - 5.1 mmol/L   Chloride 110 101 - 111 mmol/L   CO2 26 22 - 32 mmol/L   Glucose, Bld 123 (H) 65 - 99 mg/dL   BUN <5 (L) 6 - 20 mg/dL   Creatinine, Ser 0.58 0.44 - 1.00 mg/dL   Calcium 8.8 (L) 8.9 - 10.3 mg/dL   GFR calc non Af Amer >60 >60 mL/min   GFR calc Af Amer >60 >60 mL/min    Comment: (NOTE) The eGFR has been calculated using the CKD EPI equation. This calculation has not been validated in all clinical situations. eGFR's persistently <60 mL/min signify possible Chronic Kidney Disease.    Anion gap 6 5 - 15   No components found for: ESR, C REACTIVE PROTEIN MICRO: Recent Results (from the past 720 hour(s))  Culture, bal-quantitative     Status: None   Collection Time: 12/15/14  5:23 PM  Result Value Ref Range Status   Specimen Description BRONCHIAL ALVEOLAR LAVAGE  Final   Special Requests Normal  Final   Gram Stain   Final    EXCELLENT SPECIMEN - 90-100% WBCS FEW WBC SEEN NO ORGANISMS SEEN    Culture   Final     LIGHT GROWTH Consistent with normal respiratory flora.   Report Status 12/18/2014 FINAL  Final  Fungus Culture with Smear     Status: None (Preliminary result)   Collection Time: 12/15/14  5:23 PM  Result Value Ref Range Status   Specimen Description BRONCHIAL ALVEOLAR LAVAGE  Final   Special Requests Normal  Final   Fungal Smear YEAST IDENTIFICATION TO FOLLOW   Final   Culture CANDIDA GLABRATA  Final   Report Status PENDING  Incomplete  Acid Fast Smear     Status: None   Collection Time: 12/15/14  5:23 PM  Result Value Ref Range Status   AFB Specimen Processing Concentration  Final   Acid Fast Smear Negative  Final    Comment: (NOTE) Performed At: [facility] [place], [place] [id number] Lindon Romp MD [id number]    Source (AFB) BRONCHIAL ALVEOLAR LAVAGE  Final    IMAGING: Ct Angio Chest Pe W/cm &/or Wo Cm  12/07/2014  CLINICAL DATA:  Hemoptysis this morning. Schedule for bronchoscopy on 12/12/2014 for a persistent left-sided pneumonia. EXAM: CT ANGIOGRAPHY CHEST WITH CONTRAST TECHNIQUE: Multidetector CT imaging of the  chest was performed using the standard protocol during bolus administration of intravenous contrast. Multiplanar CT image reconstructions and MIPs were obtained to evaluate the vascular anatomy. CONTRAST:  59m OMNIPAQUE IOHEXOL 350 MG/ML SOLN COMPARISON:  Chest CT 11/16/2014 FINDINGS: Mediastinum/Nodes: No breast masses, supraclavicular or axillary lymphadenopathy. Mild thyromegaly. The heart is normal in size and stable. Small amount of pericardial fluid but no overt effusion. The aorta demonstrates stable advanced atherosclerotic calcifications along with dense calcifications at the major branch vessel ostia. Coronary artery calcifications are stable. The esophagus is grossly normal. Stable mediastinal and hilar lymphadenopathy. The pulmonary arterial tree is well opacified. No filling defects to suggest pulmonary embolism. Lungs/Pleura:  Persistent dense left upper lobe airspace consolidation most consistent with pneumonia. There are vessels running through this area along with air bronchograms. There is also severe surrounding inflammation/pneumonia involving the entire left upper lobe and also the right lower lobe. Moderate to marked peribronchial thickening. Persistent/stable small cavitary lesions in the superior segment of the right lower lobe the lesion on image 61 measures 12.5 mm and the lesion on image 59 measures 9 mm. Underlying emphysema.  No pleural effusion. Upper abdomen: No significant findings. Advanced atherosclerotic calcifications involving the aorta and branch vessels. Musculoskeletal: Scoliosis and advanced degenerative changes involving the thoracic spine. No acute bony abnormality. Review of the MIP images confirms the above findings. IMPRESSION: 1. Persistent and slight progression of left upper lobe and left lower lobe pneumonia. 2. Persistent small cavitary lesions in the right lower lobe. 3. Persistent mediastinal and hilar lymphadenopathy. 4. No CT findings for pulmonary embolism. 5. Stable advanced atherosclerotic calcifications involving the thoracic and abdominal aorta and branch vessels. 6. Patient is apparently already scheduled for a bronchoscopy. PET-CT may also be helpful for further evaluation. Electronically Signed   By: PMarijo SanesM.D.   On: 12/07/2014 12:12   Dg Chest Portable 1 View  01/04/2015  CLINICAL DATA:  Dyspnea and cough for a month. EXAM: PORTABLE CHEST 1 VIEW COMPARISON:  12/15/2014 FINDINGS: There is dense consolidation in the left upper lobe. There is partial clearance of left base opacity since the prior study. The right lung remains clear. No large effusion. IMPRESSION: Persistent dense left upper lobe consolidation. Electronically Signed   By: DAndreas NewportM.D.   On: 01/04/2015 04:14   Portable Chest 1 View  12/15/2014  CLINICAL DATA:  Hypoxic after bronchoscopy. EXAM: PORTABLE  CHEST 1 VIEW COMPARISON:  12/07/2014 FINDINGS: The cardiac silhouette, mediastinal and hilar contours are grossly stable. There is tortuosity and calcification of the thoracic aorta. Persistent and progressive left lung airspace process. Could not exclude superimposed hemorrhage or asymmetric edema in the left lung. Right basilar atelectasis is also noted. No pneumothorax is identified. IMPRESSION: Worsening left lung aeration with progressive airspace disease. Right lower lobe atelectasis. No postprocedural pneumothorax. Electronically Signed   By: PMarijo SanesM.D.   On: 12/15/2014 18:49   Dg Chest Port 1 View  12/07/2014  CLINICAL DATA:  Chest pain EXAM: PORTABLE CHEST 1 VIEW COMPARISON:  11/24/2014 FINDINGS: Normal heart size. No pleural effusion or edema. There is persistent asymmetric opacification involving the left upper lobe. Right lung remains clear. IMPRESSION: Persistent left upper lobe opacification which in the cute setting may be consistent with pneumonia. Followup PA and lateral chest X-ray is recommended in 3-4 weeks following trial of antibiotic therapy to ensure resolution and exclude underlying malignancy. Electronically Signed   By: TKerby MoorsM.D.   On: 12/07/2014 09:38   PRIOR IMAGING  Jan 2015  PET 2015 Reviewed  But unable to be imported   Path  11/14 Bronch bxp A. SUBCARINAL LYMPH NODE; EBUS FINE-NEEDLE ASPIRATION AND BIOPSY:  - ISOLATED ATYPICAL CELLS, SCANT INFLAMMATION AND ABUNDANT BRONCHIAL  CELLS (SEE NOTE).   Note:  Immunohistochemical stains were performed. TTF-1 highlights background  benign cells and p40 is negative. The control slides stained  appropriately. Given the scant inflammation present it is unclear if  this sample is representative of the node. Clinical correlation is  required. A cell block was included the interpretation of this case.   Assessment:   Emily Kidd is a 64 y.o. female  with a hx of COPD, followed by Dr Raul Del for abnormal CT  scan chest, prior CVA, Htn, prior tobacco use who was admitted initially  10/16 with several weeks of increasing cough, hemoptysis and chest pain, sob.  Pt had been dxed with PNA as otpt and started on levofloxacin however sxs persisted and came to ED. CT done since admit shows infiltrate, as well as nodularity with one cavitary nodule (of note no comparison was done to prior CTs which have been abnormal.  She had bcx with Strep Mitis and was dischaged on 10/21 on oral clindamycin.  She had bronch done 11/14 and had one day admission following that for hypoxa - treated with pred taper. Of note her BAL specimen is neg for routine cx (nml flora), AFB and fungal with just C glabrata.  US guided bxp of visible subcarinal LN was negative for malignacny  Readmitted 12/4 with increasing SOB and hypoxia. On admit was afebrile, wbc nml.  No further hemoptysis.  Denies NS or fevers at home. Prior smoker but quit 2011 when had a CVA. Does report some dysphagia with foods since CVA. 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  Her CT scan continues to show LUL infiltrate and CXR shows today that it persists. She has not been on any abx as otpt.   I am not sure what the etiology of this process is but clearly it is persistent. I do not think it is TB as AFB negative, HIV negative. Malignancy is still a concern.  However given her aspiration risk,   Recommendations Start clindamycin orally for probable recurrent aspiration - would suggest a 21 day course with fu with me as otpt for repeat imaging. Consider speech and swallow eval if not done already  Defer to Dr Raul Del if need further bxp.   Thank you very much for allowing me to participate in the care of this patient. Please call with questions.   Cheral Marker. Ola Spurr, MD

## 2015-01-05 NOTE — Care Management (Addendum)
Patient is active in PACE program, Emily Kidd from [facility] aware of patient inpatient status.

## 2015-01-05 NOTE — Progress Notes (Signed)
Emily Kidd at [place] NAME: Emily Kidd    MR#:  [id number]  DATE OF BIRTH:  [birth year]  SUBJECTIVE:  CHIEF COMPLAINT:   Chief Complaint  Patient presents with  . Shortness of Breath   Hemoptysis decreased, none since yesterday. Feels short of breath this morning and remains on nasal cannula.  REVIEW OF SYSTEMS:   Review of Systems  Constitutional: Positive for malaise/fatigue. Negative for fever.  Respiratory: Positive for cough, hemoptysis, sputum production, shortness of breath and wheezing.   Cardiovascular: Negative for chest pain and palpitations.  Gastrointestinal: Negative for nausea, vomiting and abdominal pain.  Genitourinary: Negative for dysuria.  Neurological: Positive for weakness.    DRUG ALLERGIES:   Allergies  Allergen Reactions  . Azithromycin   . Penicillins Rash    VITALS:  Blood pressure 131/88, pulse 111, temperature 98.1 F (36.7 C), temperature source Oral, resp. rate 17, height '5\' 4"'$  (1.626 m), weight 74.39 kg (164 lb), SpO2 92 %.  PHYSICAL EXAMINATION:  GENERAL:  64 y.o.-year-old patient lying in the bed with no acute distress.  LUNGS: Coarse breath sounds, diffuse fine wheezes, good air movement, no respiratory distress CARDIOVASCULAR: S1, S2 normal. No murmurs, rubs, or gallops.  ABDOMEN: Soft, nontender, nondistended. Bowel sounds present. No organomegaly or mass. No guarding no rebound EXTREMITIES: Status post right AKA, no edema NEUROLOGIC: Cranial nerves II through XII are intact. Muscle strength 5/5. Sensation intact. Gait not checked.  PSYCHIATRIC: The patient is alert and oriented x 3. Anxious SKIN: No obvious rash, lesion, or ulcer.    LABORATORY PANEL:   CBC  Recent Labs Lab 01/05/15 0551  WBC 10.2  HGB 11.0*  HCT 33.7*  PLT 292   ------------------------------------------------------------------------------------------------------------------  Chemistries   Recent  Labs Lab 01/05/15 0551  NA 142  K 3.7  CL 110  CO2 26  GLUCOSE 123*  BUN <5*  CREATININE 0.58  CALCIUM 8.8*   ------------------------------------------------------------------------------------------------------------------  Cardiac Enzymes No results for input(s): TROPONINI in the last 168 hours. ------------------------------------------------------------------------------------------------------------------  RADIOLOGY:  Dg Chest Portable 1 View  01/04/2015  CLINICAL DATA:  Dyspnea and cough for a month. EXAM: PORTABLE CHEST 1 VIEW COMPARISON:  12/15/2014 FINDINGS: There is dense consolidation in the left upper lobe. There is partial clearance of left base opacity since the prior study. The right lung remains clear. No large effusion. IMPRESSION: Persistent dense left upper lobe consolidation. Electronically Signed   By: Andreas Newport M.D.   On: 01/04/2015 04:14    EKG:   Orders placed or performed during the hospital encounter of 01/04/15  . ED EKG  . ED EKG    ASSESSMENT AND PLAN:   1. Acute respiratory failure with hypoxia - Is not on home oxygen, now requiring 2 L to keep sats above 90 - Due to COPD exacerbation and possibly related to fungal pneumonia  2. COPD exacerbation - Her primary pulmonologist Dr. Raul Del has been consulted, pending - Continue duo nebs - She is on 30 mg of prednisone daily since discharge on 11/15, she is not wheezing significantly. I will not increase the dose at this time. We will need to consider tapering down  3. Fungal pneumonia - Possible cause of hemoptysis. She has had hemoptysis one time before after fine needle biopsy of subcarinal lymph node - Was scheduled to see Dr. Ola Spurr in clinic today, have asked for inpatient infectious disease consultation, not currently on systemic antifungals. - Bronchoscopy performed 11/14, BAL culture shows CANDIDA  GLABRATA, AFB negative  3. Hypertension: Blood pressure well controlled -  Continue carvedilol  4. Type 2 diabetes with neuropathy - Continue sliding scale  5. Gastroesophageal reflux - Continue PPI  All the records are reviewed and case discussed with Care Management/Social Workerr. Management plans discussed with the patient, family and they are in agreement.  CODE STATUS: Full  TOTAL TIME TAKING CARE OF THIS PATIENT: 35 minutes.  Greater than 50% of time spent in care coordination and counseling. POSSIBLE D/C IN 1 DAYS, DEPENDING ON CLINICAL CONDITION.   Myrtis Ser M.D on 01/05/2015 at 3:23 PM  Between 7am to 6pm - Pager - [phone]  After 6pm go to [email or web address] - password [email or web address] [facility]  Office  [phone]  CC: Primary care physician; [place]

## 2015-01-05 NOTE — Progress Notes (Signed)
Pt c/o chest pain. VSS. NSR on tele. Pt is talking but states "the pain is sharp 10/10 and going down my right arm and up to my mouth." Dr. Lavetta Nielsen notified and placed orders for pt.

## 2015-01-05 NOTE — Progress Notes (Signed)
Date: 01/05/2015,   MRN# [id number] Emily Kidd 1950-08-19 Code Status:     Code Status Orders        Start     Ordered   01/04/15 0858  Full code   Continuous     01/04/15 0857    Advance Directive Documentation        Most Recent Value   Type of Advance Directive  Healthcare Power of Attorney   Pre-existing out of facility DNR order (yellow form or pink MOST form)     "MOST" Form in Place?       Hosp day:'@LENGTHOFSTAYDAYS'$ @ Referring MD: '@ATDPROV'$ @       CC: hemoptysis, abnormal chest  HPI: This is a 64 year old lady, here with cough and specks of blood in sputum. No pleurisy, vague anterior chest pain.  Today she did not notice blood in her sputum. She is known to have a persistent  left infiltrate. Her w/u thus far included + ve blood cx with Strep Mitis and was dischaged on 10/21 on oral clindamycin. She had bronch done 11/14 and had one day admission following that for hypoxa - treated with pred taper. Her BAL specimen was negative was negative for malignancy. Asked to see regarding the ?? Need of further work up.   PMHX:   Past Medical History  Diagnosis Date  . Hypertension   . COPD (chronic obstructive pulmonary disease) ([place])   . Pneumonia   . Sepsis ([place])   . Lung mass   . Diabetes mellitus without complication (Emily Kidd)   . Dysrhythmia     TACHYCARDIA  . GERD (gastroesophageal reflux disease)   . Mitral regurgitation   . Carotid artery stenosis   . Muscle spasms of neck   . TIA (transient ischemic attack)   . Anxiety   . Hyperlipidemia   . Major depressive disorder (Emily Kidd)   . Chronic pain syndrome   . Drug abuse in remission   . Vertigo   . Stable angina (Emily Kidd)   . Neuropathy (Emily Kidd)   . Left-sided weakness     FROM TIA   Surgical Hx:  Past Surgical History  Procedure Laterality Date  . Endobronchial ultrasound N/A 11/24/2014    Procedure: ENDOBRONCHIAL ULTRASOUND;  Surgeon: Flora Lipps, MD;  Location: [facility] ORS;  Service: Cardiopulmonary;  Laterality: N/A;   . Above knee amputation Left   . Endobronchial ultrasound N/A 12/15/2014    Procedure: ENDOBRONCHIAL ULTRASOUND;  Surgeon: Flora Lipps, MD;  Location: [facility] ORS;  Service: Cardiopulmonary;  Laterality: N/A;   Family Hx:  Family History  Problem Relation Age of Onset  . Heart attack Father   . Stroke Father    Social Hx:   Social History  Substance Use Topics  . Smoking status: Former Research scientist (life sciences)  . Smokeless tobacco: None  . Alcohol Use: No   Medication:    Home Medication:  No current outpatient prescriptions on file.  Current Medication: '@CURMEDTAB'$ @   Allergies:  Azithromycin and Penicillins  Review of Systems: Gen:  Denies  fever, sweats, chills HEENT: Denies blurred vision, double vision, ear pain, eye pain, hearing loss, nose bleeds, sore throat Cvc:  No dizziness, chest pain or heaviness Resp: no hemoptysis, no wheezing, less cough   Gi: Denies swallowing difficulty, stomach pain, nausea or vomiting, diarrhea, constipation, bowel incontinence Gu:  Denies bladder incontinence, burning urine Ext:   No Joint pain, stiffness or swelling Skin: No skin rash, easy bruising or bleeding or hives Endoc:  No polyuria, polydipsia ,  polyphagia or weight change Psych: No depression, insomnia or hallucinations  Other:  All other systems negative  Physical Examination:   VS: BP 131/88 mmHg  Pulse 111  Temp(Src) 98.1 F (36.7 C) (Oral)  Resp 17  Ht '5\' 4"'$  (1.626 m)  Wt 164 lb (74.39 kg)  BMI 28.14 kg/m2  SpO2 94%  General Appearance: No distress, no acute symptoms,   Neuro/psych without focal findings, mental status, speech normal, alert and oriented, cranial nerves 2-12 intact, reflexes normal and symmetric, sensation grossly normal  HEENT: PERRLA, EOM intact, no ptosis, no other lesions noticed Neck: Supple, no nodes or stridor   Pulmonary:.No wheezing, No rales, decrease bs on the left   Cardiovascular:  Normal S1,S2.  No m/r/g.     Abdomen:Benign, Soft, non-tender, No  masses, hepatosplenomegaly, No lymphadenopathy Endoc: No evident thyromegaly, no signs of acromegaly or Cushing features Skin:   warm, no rashes, no ecchymosis  Extremities: normal, no cyanosis, clubbing, no edema, warm with normal capillary refill.   Labs results:   Recent Labs     01/04/15  0311  01/04/15  1224  01/05/15  0551  HGB  11.9*  11.6*  11.0*  HCT  37.0  37.3  33.7*  MCV  86.0  86.2  85.8  WBC  7.1  5.1  10.2  BUN  6   --   <5*  CREATININE  0.59   --   0.58  GLUCOSE  105*   --   123*  CALCIUM  8.9   --   8.8*  ,  CLINICAL DATA: Dyspnea and cough for a month.  EXAM: PORTABLE CHEST 1 VIEW  COMPARISON: 12/15/2014  FINDINGS: There is dense consolidation in the left upper lobe. There is partial clearance of left base opacity since the prior study. The right lung remains clear. No large effusion.  IMPRESSION: Persistent dense left upper lobe consolidation  Electronically Signed  By: Andreas Newport M.D.  On: 01/04/2015 04:14   on 12/12/2014 for a persistent left-sided pneumonia.  EXAM: CT ANGIOGRAPHY CHEST WITH CONTRAST  TECHNIQUE: Multidetector CT imaging of the chest was performed using the standard protocol during bolus administration of intravenous contrast. Multiplanar CT image reconstructions and MIPs were obtained to evaluate the vascular anatomy.  CONTRAST: 56m OMNIPAQUE IOHEXOL 350 MG/ML SOLN  COMPARISON: Chest CT 11/16/2014  FINDINGS: Mediastinum/Nodes: No breast masses, supraclavicular or axillary lymphadenopathy. Mild thyromegaly.  The heart is normal in size and stable. Small amount of pericardial fluid but no overt effusion. The aorta demonstrates stable advanced atherosclerotic calcifications along with dense calcifications at the major branch vessel ostia. Coronary artery calcifications are stable.  The esophagus is grossly normal.  Stable mediastinal and hilar lymphadenopathy.  The pulmonary arterial  tree is well opacified. No filling defects to suggest pulmonary embolism.  Lungs/Pleura: Persistent dense left upper lobe airspace consolidation most consistent with pneumonia. There are vessels running through this area along with air bronchograms. There is also severe surrounding inflammation/pneumonia involving the entire left upper lobe and also the right lower lobe. Moderate to marked peribronchial thickening.  Persistent/stable small cavitary lesions in the superior segment of the right lower lobe the lesion on image 61 measures 12.5 mm and the lesion on image 59 measures 9 mm.  Underlying emphysema. No pleural effusion.  Upper abdomen: No significant findings. Advanced atherosclerotic calcifications involving the aorta and branch vessels.  Musculoskeletal: Scoliosis and advanced degenerative changes involving the thoracic spine. No acute bony abnormality.  Review of the MIP  images confirms the above findings.  IMPRESSION: 1. Persistent and slight progression of left upper lobe and left lower lobe pneumonia. 2. Persistent small cavitary lesions in the right lower lobe. 3. Persistent mediastinal and hilar lymphadenopathy. 4. No CT findings for pulmonary embolism. 5. Stable advanced atherosclerotic calcifications involving the thoracic and abdominal aorta and branch vessels. 6. Patient is apparently already scheduled for a bronchoscopy. PET-CT may also be helpful for further evaluation.   Electronically Signed  By: Marijo Sanes M.D.  On: 12/07/2014 12:12  Path  11/14 Bronch bxp A. SUBCARINAL LYMPH NODE; EBUS FINE-NEEDLE ASPIRATION AND BIOPSY:  - ISOLATED ATYPICAL CELLS, SCANT INFLAMMATION AND ABUNDANT BRONCHIAL  CELLS (SEE NOTE).   Assessment and Plan: Persistent left infiltrate, s/p bronch, no endobronchial lesion. No significant growth on lavage, no malignancy. The blood in sputum is resolved today, most likely due to cough and pulmonary infiltrate.   -repeat chest ct -repeat sputum c/s if  possible -agree with ID assessment and plans -Speech evaluation -wean oxygen -agree with ID assessment and recommendations -out patient follow up visit.    I have personally obtained a history, examined the patient, evaluated laboratory and imaging results, formulated the assessment and plan and placed orders.  The Patient requires high complexity decision making for assessment and support, frequent evaluation and titration of therapies, application of advanced monitoring technologies and extensive interpretation of multiple databases.   Herbon Fleming,M.D. Pulmonary & Critical care Medicine [facility]

## 2015-01-06 DIAGNOSIS — J441 Chronic obstructive pulmonary disease with (acute) exacerbation: Secondary | ICD-10-CM

## 2015-01-06 LAB — BASIC METABOLIC PANEL
ANION GAP: 5 (ref 5–15)
BUN: 5 mg/dL — ABNORMAL LOW (ref 6–20)
CHLORIDE: 109 mmol/L (ref 101–111)
CO2: 26 mmol/L (ref 22–32)
Calcium: 8.7 mg/dL — ABNORMAL LOW (ref 8.9–10.3)
Creatinine, Ser: 0.54 mg/dL (ref 0.44–1.00)
GFR calc non Af Amer: >60 mL/min (ref >60)
GLUCOSE: 109 mg/dL — AB (ref 65–99)
Potassium: 3.6 mmol/L (ref 3.5–5.1)
Sodium: 140 mmol/L (ref 135–145)

## 2015-01-06 LAB — CBC
HCT: 34.4 % — ABNORMAL LOW (ref 35.0–47.0)
Hemoglobin: 11 g/dL — ABNORMAL LOW (ref 12.0–16.0)
MCH: 27.7 pg (ref 26.0–34.0)
MCHC: 32.2 g/dL (ref 32.0–36.0)
MCV: 86.1 fL (ref 80.0–100.0)
PLATELETS: 299 10*3/uL (ref 150–440)
RBC: 3.99 MIL/uL (ref 3.80–5.20)
RDW: 16.1 % — AB (ref 11.5–14.5)
WBC: 8.8 10*3/uL (ref 3.6–11.0)

## 2015-01-06 LAB — FUNGUS CULTURE W SMEAR: SPECIAL REQUESTS: NORMAL

## 2015-01-06 LAB — TROPONIN I: Troponin I: <0.03 ng/mL (ref <0.031)

## 2015-01-06 MED ORDER — CLINDAMYCIN HCL 300 MG PO CAPS: 300.0000 mg | ORAL_CAPSULE | Freq: Three times a day (TID) | ORAL | Status: DC

## 2015-01-06 MED ORDER — NITROGLYCERIN 0.4 MG SL SUBL: 0.4000 mg | SUBLINGUAL_TABLET | SUBLINGUAL | Status: AC | PRN

## 2015-01-06 NOTE — Discharge Instructions (Signed)
°  DIET:  Cardiac diet  DISCHARGE CONDITION:  Fair  ACTIVITY:  Activity as tolerated  OXYGEN:  Home Oxygen: No.   Oxygen Delivery: room air  DISCHARGE LOCATION:  Home with PACE program following   If you experience worsening of your admission symptoms, develop shortness of breath, life threatening emergency, suicidal or homicidal thoughts you must seek medical attention immediately by calling 911 or calling your MD immediately  if symptoms less severe.  You Must read complete instructions/literature along with all the possible adverse reactions/side effects for all the Medicines you take and that have been prescribed to you. Take any new Medicines after you have completely understood and accpet all the possible adverse reactions/side effects.   Please note  You were cared for by a hospitalist during your hospital stay. If you have any questions about your discharge medications or the care you received while you were in the hospital after you are discharged, you can call the unit and asked to speak with the hospitalist on call if the hospitalist that took care of you is not available. Once you are discharged, your primary care physician will handle any further medical issues. Please note that NO REFILLS for any discharge medications will be authorized once you are discharged, as it is imperative that you return to your primary care physician (or establish a relationship with a primary care physician if you do not have one) for your aftercare needs so that they can reassess your need for medications and monitor your lab values.

## 2015-01-06 NOTE — Evaluation (Signed)
Clinical/Bedside Swallow Evaluation Patient Details  Name: Emily Kidd MRN: [id number] Date of Birth: 1950-12-08  Today's Date: 01/06/2015 Time: SLP Start Time (ACUTE ONLY): 0800 SLP Stop Time (ACUTE ONLY): 0845 SLP Time Calculation (min) (ACUTE ONLY): 45 min  Past Medical History:  Past Medical History  Diagnosis Date  . Hypertension   . COPD (chronic obstructive pulmonary disease) ([place])   . Pneumonia   . Sepsis ([place])   . Lung mass   . Diabetes mellitus without complication ([place])   . Dysrhythmia     TACHYCARDIA  . GERD (gastroesophageal reflux disease)   . Mitral regurgitation   . Carotid artery stenosis   . Muscle spasms of neck   . TIA (transient ischemic attack)   . Anxiety   . Hyperlipidemia   . Major depressive disorder ([place])   . Chronic pain syndrome   . Drug abuse in remission   . Vertigo   . Stable angina ([facility])   . Neuropathy ([place])   . Left-sided weakness     FROM TIA   Past Surgical History:  Past Surgical History  Procedure Laterality Date  . Endobronchial ultrasound N/A 11/24/2014    Procedure: ENDOBRONCHIAL ULTRASOUND;  Surgeon: Flora Lipps, MD;  Location: [facility] ORS;  Service: Cardiopulmonary;  Laterality: N/A;  . Above knee amputation Left   . Endobronchial ultrasound N/A 12/15/2014    Procedure: ENDOBRONCHIAL ULTRASOUND;  Surgeon: Flora Lipps, MD;  Location: [facility] ORS;  Service: Cardiopulmonary;  Laterality: N/A;   HPI:  Pt presented to the ED w/ shortness of breath. Pt has significant PMH including TIA/CVA in 2007, GERD, major depressive dis., DM, HTN and is significant for COPD but most recently her biggest issue has been hemoptysis. She was admitted to the hospital last month for evaluation of what appears to be a fungal ball in her left lung. She was hypoxic after her bronchoscopy at that time but was discharged home without oxygen. Today in the emergency department she is oxygen dependent and has oxygen saturations 85% on room air. She continues to have  scant hemoptysis. Emergency department chest x-ray showed a stable lung mass; Bronchoscopy performed 11/14, BAL culture shows CANDIDA GLABRATA and not currently on an antifungal per MD note. ID is following pt now. Pt is on a regular diet w/ thin liquids and denies any trouble swallowing; NSG acknowledged same stating pt was taking all pills w/ liquids adequately.   Assessment / Plan / Recommendation Clinical Impression  Pt appeared to adequately tolerate trials of thin liquids and solids w/ no overt s/s of aspiration noted during and post trials; no change in vocal quality noted and no decline in respiratory status w/ trials. Pt exhibited no significant oral phase deficits w/ soft, moist foods. Pt fed self w/ min. setup assist. Discussed w/ pt general aspiration precautions including sitting upright w/ all oral intake and eating/drinking slowly. Pt agreed verbally. NSG arrived and pt swallowed multiple pills w/ thin liquids w/ no deficits noted. Pt appears at reduced risk for aspiration following general aspiration precautions. She denied no problems swallowing at home or during this admission. NSG will monitor and reconsult ST services if indicated.     Aspiration Risk   (reduced)    Diet Recommendation  Mech soft/regular - meats cut well; moist. Aspiration precautions.   Medication Administration: Whole meds with liquid    Other  Recommendations Oral Care Recommendations: Oral care BID;Patient independent with oral care (w/ setup)   Follow up Recommendations   (none for  ST at this time)    Frequency and Duration            Prognosis Prognosis for Safe Diet Advancement: Good      Swallow Study   General Date of Onset: 01/04/15 HPI: Pt presented to the ED w/ shortness of breath. Pt has significant PMH including TIA/CVA in 2007, GERD, major depressive dis., DM, HTN and is significant for COPD but most recently her biggest issue has been hemoptysis. She was admitted to the hospital last  month for evaluation of what appears to be a fungal ball in her left lung. She was hypoxic after her bronchoscopy at that time but was discharged home without oxygen. Today in the emergency department she is oxygen dependent and has oxygen saturations 85% on room air. She continues to have scant hemoptysis. Emergency department chest x-ray showed a stable lung mass; Bronchoscopy performed 11/14, BAL culture shows CANDIDA GLABRATA and not currently on an antifungal per MD note. ID is following pt now. Pt is on a regular diet w/ thin liquids and denies any trouble swallowing; NSG acknowledged same stating pt was taking all pills w/ liquids adequately. Type of Study: Bedside Swallow Evaluation Previous Swallow Assessment: none Diet Prior to this Study: Regular;Thin liquids Temperature Spikes Noted: No (wbc 8.8) Respiratory Status: Nasal cannula (1-2 liters) History of Recent Intubation:  (last month) Behavior/Cognition: Alert;Cooperative;Pleasant mood Oral Cavity Assessment: Within Functional Limits Oral Care Completed by SLP: No (eating meal) Oral Cavity - Dentition: Edentulous Vision: Functional for self-feeding Self-Feeding Abilities: Able to feed self;Needs assist Patient Positioning: Upright in bed Baseline Vocal Quality: Normal Volitional Cough: Strong Volitional Swallow: Able to elicit    Oral/Motor/Sensory Function Overall Oral Motor/Sensory Function: Within functional limits   Ice Chips Ice chips: Not tested   Thin Liquid Thin Liquid: Within functional limits Presentation: Self Fed;Straw Other Comments: ~6 ozs     Nectar Thick Nectar Thick Liquid: Not tested   Honey Thick Honey Thick Liquid: Not tested   Puree Puree: Not tested   Solid Solid: Within functional limits Presentation: Self Fed;Spoon (potatoes and eggs) Other Comments: several boluses of each      Orinda Kenner, MS, CCC-SLP  Emily Kidd 01/06/2015,11:57 AM

## 2015-01-06 NOTE — Care Management (Signed)
Patient for discharge today. Notified Thresa Ross at [facility] CSW following for EMS transport.

## 2015-01-06 NOTE — Progress Notes (Signed)
Date: 01/06/2015,   MRN# [id number] DENNETTE FAULCONER 1950-05-17 Code Status:     Code Status Orders        Start     Ordered   01/04/15 0858  Full code   Continuous     01/04/15 0857    Advance Directive Documentation        Most Recent Value   Type of Advance Directive  Healthcare Power of Attorney   Pre-existing out of facility DNR order (yellow form or pink MOST form)     "MOST" Form in Place?       Hosp day:'@LENGTHOFSTAYDAYS'$ @ Referring MD: '@ATDPROV'$ @      HPI: no sob, blood, wheezing or pleurisy. No angina, ectopy, discussed d/cing plans with her.    PMHX:   Past Medical History  Diagnosis Date  . Hypertension   . COPD (chronic obstructive pulmonary disease) ([place])   . Pneumonia   . Sepsis ([place])   . Lung mass   . Diabetes mellitus without complication ([place])   . Dysrhythmia     TACHYCARDIA  . GERD (gastroesophageal reflux disease)   . Mitral regurgitation   . Carotid artery stenosis   . Muscle spasms of neck   . TIA (transient ischemic attack)   . Anxiety   . Hyperlipidemia   . Major depressive disorder ([place])   . Chronic pain syndrome   . Drug abuse in remission   . Vertigo   . Stable angina ([facility])   . Neuropathy ([place])   . Left-sided weakness     FROM TIA   Surgical Hx:  Past Surgical History  Procedure Laterality Date  . Endobronchial ultrasound N/A 11/24/2014    Procedure: ENDOBRONCHIAL ULTRASOUND;  Surgeon: Flora Lipps, MD;  Location: [facility] ORS;  Service: Cardiopulmonary;  Laterality: N/A;  . Above knee amputation Left   . Endobronchial ultrasound N/A 12/15/2014    Procedure: ENDOBRONCHIAL ULTRASOUND;  Surgeon: Flora Lipps, MD;  Location: [facility] ORS;  Service: Cardiopulmonary;  Laterality: N/A;   Family Hx:  Family History  Problem Relation Age of Onset  . Heart attack Father   . Stroke Father    Social Hx:   Social History  Substance Use Topics  . Smoking status: Former Research scientist (life sciences)  . Smokeless tobacco: None  . Alcohol Use: No   Medication:    Home  Medication:  Current Outpatient Rx  Name  Route  Sig  Dispense  Refill  . clindamycin (CLEOCIN) 300 MG capsule   Oral   Take 1 capsule (300 mg total) by mouth 3 (three) times daily.   63 capsule   0   . nitroGLYCERIN (NITROSTAT) 0.4 MG SL tablet   Sublingual   Place 1 tablet (0.4 mg total) under the tongue every 5 (five) minutes as needed for chest pain.   10 tablet   12     Current Medication: '@CURMEDTAB'$ @   Allergies:  Azithromycin and Penicillins  Review of Systems: Gen:  Denies  fever, sweats, chills HEENT: Denies blurred vision, double vision, ear pain, eye pain, hearing loss, nose bleeds, sore throat Cvc:  No dizziness, chest pain or heaviness Resp: no blood, no sob, no wheezing   Gi: Denies swallowing difficulty, stomach pain, nausea or vomiting, diarrhea, constipation, bowel incontinence Gu:  Denies bladder incontinence, burning urine Ext:   No Joint pain, stiffness or swelling Skin: No skin rash, easy bruising or bleeding or hives Endoc:  No polyuria, polydipsia , polyphagia or weight change Psych: No depression, insomnia or hallucinations  Other:  All other systems negative  Physical Examination:   VS: BP 154/92 mmHg  Pulse 112  Temp(Src) 97.8 F (36.6 C) (Oral)  Resp 17  Ht '5\' 4"'$  (1.626 m)  Wt 164 lb 14.4 oz (74.798 kg)  BMI 28.29 kg/m2  SpO2 96%  General Appearance: No distress  Neuro/psych without focal findings, mental status, speech normal, alert and oriented, cranial nerves 2-12 intact, reflexes normal and symmetric, sensation grossly normal  HEENT: PERRLA, EOM intact, no ptosis, no other lesions noticed NECK: Supple, short neck  Pulmonary:.No wheezing, No rales   Cardiovascular:  Normal S1,S2.  No m/r/g.  Abdominal aorta pulsation normal.    Abdomen:Benign, Soft, non-tender, No masses, hepatosplenomegaly, No lymphadenopathy Endoc: No evident thyromegaly, no signs of acromegaly or Cushing features Skin:   warm, no rashes, no ecchymosis   Extremities: normal, no cyanosis, clubbing, no edema, warm with normal capillary refill.    Labs results:   Recent Labs     01/04/15  0311  01/04/15  1224  01/05/15  0551  01/06/15  0353  HGB  11.9*  11.6*  11.0*  11.0*  HCT  37.0  37.3  33.7*  34.4*  MCV  86.0  86.2  85.8  86.1  WBC  7.1  5.1  10.2  8.8  BUN  6   --   <5*  5*  CREATININE  0.59   --   0.58  0.54  GLUCOSE  105*   --   123*  109*  CALCIUM  8.9   --   8.8*  8.7*  ,     Assessment and Plan: Persistent left infiltrate, s/p bronch, no endobronchial lesion. No significant growth on lavage, no malignancy. No more hemoptysis, most likely due to cough and pulmonary infiltrate.  -agree with ID assessment and plans -Speech evaluation, (done, benign) -wean oxygen -out pt f/u -repeat chest xray next week -may need repeat chest ct/bronch per above  I have personally obtained a history, examined the patient, evaluated laboratory and imaging results, formulated the assessment and plan and placed orders.  The Patient requires high complexity decision making for assessment and support, frequent evaluation and titration of therapies, application of advanced monitoring technologies and extensive interpretation of multiple databases.   Lovely Kerins,M.D. Pulmonary & Critical care Medicine [facility]

## 2015-01-06 NOTE — Discharge Summary (Signed)
[facility] [place]   PATIENT NAME: Emily Kidd    MR#:  [id number]  DATE OF BIRTH:  02-May-1950  DATE OF ADMISSION:  01/04/2015 ADMITTING PHYSICIAN: Harrie Foreman, MD  DATE OF DISCHARGE: 01/06/2015  PRIMARY CARE PHYSICIAN: [place]    ADMISSION DIAGNOSIS:  Chronic obstructive pulmonary disease with acute exacerbation (HCC) [J44.1]  DISCHARGE DIAGNOSIS:  Active Problems:   Pneumonia   Hypoxia   Chronic obstructive pulmonary disease with acute exacerbation ([place])   SECONDARY DIAGNOSIS:   Past Medical History  Diagnosis Date  . Hypertension   . COPD (chronic obstructive pulmonary disease) ([place])   . Pneumonia   . Sepsis ([place])   . Lung mass   . Diabetes mellitus without complication ([place])   . Dysrhythmia     TACHYCARDIA  . GERD (gastroesophageal reflux disease)   . Mitral regurgitation   . Carotid artery stenosis   . Muscle spasms of neck   . TIA (transient ischemic attack)   . Anxiety   . Hyperlipidemia   . Major depressive disorder ([place])   . Chronic pain syndrome   . Drug abuse in remission   . Vertigo   . Stable angina (HCC)   . Neuropathy ([place])   . Left-sided weakness     FROM TIA    HOSPITAL COURSE:    1. Acute respiratory failure with hypoxia: Resolved - Due to COPD exacerbation and possibly related to potential aspiration pneumonia - No longer requiring supplemental oxygen  2. COPD exacerbation: Resolved - No wheezing or shortness of breath at the time of discharge. She was treated with duo nebs during the hospitalization. Would resume home regimen. Note that she has been on 30 mg of prednisone since her last discharge in November. I would consider tapering this down and taking her off if at all possible.  3. Probable aspiration pneumonia: This is most likely the cause of her hemoptysis. Both pulmonology and infectious disease feel that her imaging and presentation are  most consistent with aspiration pneumonia. She was seen by speech therapy during this hospitalization who did not feel that she was aspirating. Note that the patient has a history of chronic GERD and also has a tendency to lean to the left, in a slouched position. She may have some aspiration present despite passing the swallow testing today. She has been started on clindamycin and will continue for a 21 day course. She will follow up with both infectious disease and pulmonology. She may need PET scan versus repeat biopsy to evaluate the left upper lobe lesion fully.  3. Hypertension: Blood pressure well controlled. Continue carvedilol  4. Type 2 diabetes with neuropathy: Blood sugars well controlled with sliding scale insulin during the hospitalization. Resume home medications including metformin on discharge  5. Gastroesophageal reflux: Continue PPI  6. Chest pain: Patient did have one episode of chest pain during hospitalization. EKG was performed with no changes from prior study. Troponins were negative 3. This was not thought to be due to ACS. She did mention that nitroglycerin helped with her symptoms. I have provided a prescription for nitroglycerin to be used as needed.   DISCHARGE CONDITIONS:   Stable  CONSULTS OBTAINED:  Treatment Team:  Erby Pian, MD Adrian Prows, MD  DRUG ALLERGIES:   Allergies  Allergen Reactions  . Azithromycin   . Penicillins Rash    DISCHARGE MEDICATIONS:   Current Discharge Medication List    START  taking these medications   Details  clindamycin (CLEOCIN) 300 MG capsule Take 1 capsule (300 mg total) by mouth 3 (three) times daily. Qty: 63 capsule, Refills: 0    nitroGLYCERIN (NITROSTAT) 0.4 MG SL tablet Place 1 tablet (0.4 mg total) under the tongue every 5 (five) minutes as needed for chest pain. Qty: 10 tablet, Refills: 12      CONTINUE these medications which have NOT CHANGED   Details  acetaminophen (TYLENOL) 650 MG CR  tablet Take 650 mg by mouth 3 (three) times daily.    albuterol (PROVENTIL) (2.5 MG/3ML) 0.083% nebulizer solution Take 2.5 mg by nebulization every 4 (four) hours as needed for shortness of breath.     alum & mag hydroxide-simeth (MAALOX ADVANCED MAX ST) 400-400-40 MG/5ML suspension Take 15 mLs by mouth 4 (four) times daily as needed (nausea).     atorvastatin (LIPITOR) 40 MG tablet Take 40 mg by mouth at bedtime.    benzocaine (HURRICAINE) 20 % GEL Use as directed 1 application in the mouth or throat 3 (three) times daily as needed (for pain).     benzonatate (TESSALON) 100 MG capsule Take 100 mg by mouth 3 (three) times daily as needed for cough.    camphor-menthol (SARNA) lotion Apply 1 application topically 4 (four) times daily as needed for itching.    Carboxymethylcellul-Glycerin (OPTIVE) 0.5-0.9 % SOLN Apply 1 drop to eye See admin instructions. 3 to 4 times daily as needed for dryness/irritation    carvedilol (COREG) 3.125 MG tablet Take 3.125 mg by mouth 2 (two) times daily.     Diaper Rash Products (DESITIN) OINT Apply 1 application topically every morning. To buttocks    diclofenac sodium (VOLTAREN) 1 % GEL Apply 2 g topically every 8 (eight) hours as needed (for pain).     docusate sodium (COLACE) 100 MG capsule Take 100 mg by mouth daily as needed for mild constipation.    lidocaine (LIDODERM) 5 % Place 1 patch onto the skin at bedtime. Remove & Discard patch within 12 hours or as directed by MD    loperamide (IMODIUM A-D) 2 MG capsule Take 2 mg by mouth as needed for diarrhea or loose stools (2 by mouth after 1st loose stool, then 1 following each subsequent loose stool).    Melatonin 3 MG TABS Take 1 tablet by mouth at bedtime.    metFORMIN (GLUCOPHAGE) 500 MG tablet Take 500 mg by mouth daily.    miconazole (ZEASORB-AF) 2 % powder Apply 1 application topically every morning. To thoroughly dry groin after bath    Omega-3 Fatty Acids (OMEGA-3 FISH OIL PO) Take 2  capsules by mouth daily.    oxybutynin (DITROPAN-XL) 10 MG 24 hr tablet Take 10 mg by mouth at bedtime.     pantoprazole (PROTONIX) 40 MG tablet Take 40 mg by mouth every morning.     polyethylene glycol (MIRALAX / GLYCOLAX) packet Take 17 g by mouth every evening.     predniSONE (DELTASONE) 10 MG tablet Take 3 tablets (30 mg total) by mouth daily with breakfast. Qty: 21 tablet, Refills: 0    !! pregabalin (LYRICA) 150 MG capsule Take 150 mg by mouth 2 (two) times daily. Along with lyrica 50 mg    !! pregabalin (LYRICA) 200 MG capsule Take 200 mg by mouth.    !! pregabalin (LYRICA) 50 MG capsule Take 50 mg by mouth 2 (two) times daily. Along with Lyrica 150 mg    senna (SENOKOT) 8.6 MG TABS tablet Take  1 tablet by mouth every evening.     sodium chloride (OCEAN) 0.65 % SOLN nasal spray Place 1-2 sprays into both nostrils as needed for congestion.    tiotropium (SPIRIVA HANDIHALER) 18 MCG inhalation capsule Place 18 mcg into inhaler and inhale daily.    traZODone (DESYREL) 100 MG tablet Take 100 mg by mouth at bedtime.    venlafaxine XR (EFFEXOR-XR) 75 MG 24 hr capsule Take 75 mg by mouth daily.    Vitamin D, Ergocalciferol, (DRISDOL) 50000 UNITS CAPS capsule Take 50,000 Units by mouth every 30 (thirty) days. On the first Monday of each month     !! - Potential duplicate medications found. Please discuss with provider.       DISCHARGE INSTRUCTIONS:    Discharge in stable condition. She will be followed by [facility] program. Resume heart healthy carbohydrate modified diet. Will follow-up with primary care, infectious disease and pulmonology. No new home health needs  If you experience worsening of your admission symptoms, develop shortness of breath, life threatening emergency, suicidal or homicidal thoughts you must seek medical attention immediately by calling 911 or calling your MD immediately  if symptoms less severe.  You Must read complete instructions/literature along with  all the possible adverse reactions/side effects for all the Medicines you take and that have been prescribed to you. Take any new Medicines after you have completely understood and accept all the possible adverse reactions/side effects.   Please note  You were cared for by a hospitalist during your hospital stay. If you have any questions about your discharge medications or the care you received while you were in the hospital after you are discharged, you can call the unit and asked to speak with the hospitalist on call if the hospitalist that took care of you is not available. Once you are discharged, your primary care physician will handle any further medical issues. Please note that NO REFILLS for any discharge medications will be authorized once you are discharged, as it is imperative that you return to your primary care physician (or establish a relationship with a primary care physician if you do not have one) for your aftercare needs so that they can reassess your need for medications and monitor your lab values.    Today   CHIEF COMPLAINT:   Chief Complaint  Patient presents with  . Shortness of Breath    HISTORY OF PRESENT ILLNESS:  The patient presents emergency department complaining of shortness of breath. Past medical history is significant for COPD but most recently her biggest issue has been hemoptysis. She was admitted to the hospital last month for evaluation of what appears to be a fungal ball in her left lung. She was hypoxic after her bronchoscopy at that time but was discharged home without oxygen. Today in the emergency department she is oxygen dependent and has oxygen saturations 85% on room air. She continues to have scant hemoptysis. Emergency department chest x-ray showed a stable lung mass. The patient is also hemodynamically stable. Due to her oxygen requirement the emergency department called for admission.   VITAL SIGNS:  Blood pressure 137/78, pulse 118,  temperature 98.6 F (37 C), temperature source Oral, resp. rate 17, height '5\' 4"'$  (1.626 m), weight 74.798 kg (164 lb 14.4 oz), SpO2 96 %.  I/O:   Intake/Output Summary (Last 24 hours) at 01/06/15 1544 Last data filed at 01/06/15 1300  Gross per 24 hour  Intake   1460 ml  Output   1900 ml  Net   -440 ml    PHYSICAL EXAMINATION:  GENERAL: 64 y.o.-year-old patient lying in the bed with no acute distress.  LUNGS: Coarse breath sounds, diffuse fine wheezes, good air movement, no respiratory distress CARDIOVASCULAR: S1, S2 normal. No murmurs, rubs, or gallops.  ABDOMEN: Soft, nontender, nondistended. Bowel sounds present. No organomegaly or mass. No guarding no rebound EXTREMITIES: Status post right AKA, no edema NEUROLOGIC: Cranial nerves II through XII are intact. Muscle strength 5/5. Sensation intact. Gait not checked.  PSYCHIATRIC: The patient is alert and oriented x 3. Anxious SKIN: No obvious rash, lesion, or ulcer.  DATA REVIEW:   CBC  Recent Labs Lab 01/06/15 0353  WBC 8.8  HGB 11.0*  HCT 34.4*  PLT 299    Chemistries   Recent Labs Lab 01/06/15 0353  NA 140  K 3.6  CL 109  CO2 26  GLUCOSE 109*  BUN 5*  CREATININE 0.54  CALCIUM 8.7*    Cardiac Enzymes  Recent Labs Lab 01/06/15 1507  [place] <0.03    Microbiology Results  Results for orders placed or performed during the hospital encounter of 12/15/14  Culture, bal-quantitative     Status: None   Collection Time: 12/15/14  5:23 PM  Result Value Ref Range Status   Specimen Description BRONCHIAL ALVEOLAR LAVAGE  Final   Special Requests Normal  Final   Gram Stain   Final    EXCELLENT SPECIMEN - 90-100% WBCS FEW WBC SEEN NO ORGANISMS SEEN    Culture   Final    LIGHT GROWTH Consistent with normal respiratory flora.   Report Status 12/18/2014 FINAL  Final  Fungus Culture with Smear     Status: None   Collection Time: 12/15/14  5:23 PM  Result Value Ref Range Status   Specimen Description  BRONCHIAL ALVEOLAR LAVAGE  Final   Special Requests Normal  Final   Fungal Smear YEAST IDENTIFICATION TO FOLLOW   Final   Culture CANDIDA GLABRATA  Final   Report Status 01/06/2015 FINAL  Final  Acid Fast Smear     Status: None   Collection Time: 12/15/14  5:23 PM  Result Value Ref Range Status   AFB Specimen Processing Concentration  Final   Acid Fast Smear Negative  Final    Comment: (NOTE) Performed At: [facility] [place], [place] [id number] Lindon Romp MD WN:[id number]    Source (AFB) BRONCHIAL ALVEOLAR LAVAGE  Final    RADIOLOGY:  No results found.  EKG:   Orders placed or performed during the hospital encounter of 01/04/15  . ED EKG  . ED EKG  . ED EKG  . ED EKG  . EKG 12-Lead  . EKG 12-Lead      Management plans discussed with the patient, family and they are in agreement.  CODE STATUS:     Code Status Orders        Start     Ordered   01/04/15 0858  Full code   Continuous     01/04/15 0857    Advance Directive Documentation        Most Recent Value   Type of Advance Directive  Healthcare Power of Attorney   Pre-existing out of facility DNR order (yellow form or pink MOST form)     "MOST" Form in Place?        TOTAL TIME TAKING CARE OF THIS PATIENT: 35 minutes.  Greater than 50% of time spent in care coordination and counseling.  WALSH, CATHERINE  M.D on 01/06/2015 at 3:44 PM  Between 7am to 6pm - Pager - [phone]  After 6pm go to [email or web address] - password [email or web address] [facility]  Office  [phone]  CC: Primary care physician; [place]

## 2015-01-06 NOTE — Progress Notes (Signed)
[facility] INFECTIOUS [facility] PROGRESS NOTE Date of Admission:  01/04/2015     ID: Emily Kidd is a 64 y.o. female with recurrent PNA Active Problems:   Pneumonia   Hypoxia   Chronic obstructive pulmonary disease with acute exacerbation (HCC)   Subjective: Feels ok except for some cough. Off o2   ROS  Eleven systems are reviewed and negative except per hpi  Medications:  Antibiotics Given (last 72 hours)    Date/Time Action Medication Dose   01/05/15 1724 Given   clindamycin (CLEOCIN) capsule 300 mg 300 mg   01/05/15 2139 Given   clindamycin (CLEOCIN) capsule 300 mg 300 mg   01/06/15 0608 Given   clindamycin (CLEOCIN) capsule 300 mg 300 mg     . acetaminophen  500 mg Oral TID  . atorvastatin  40 mg Oral QHS  . carvedilol  3.125 mg Oral BID  . clindamycin  300 mg Oral 3 times per day  . Influenza vac split quadrivalent PF  0.5 mL Intramuscular Tomorrow-1000  . ipratropium-albuterol  3 mL Nebulization Q6H  . lidocaine  1 patch Transdermal QHS  . liver oil-zinc oxide  1 application Topical [id number]  . nystatin   Topical BH-q7a  . oxybutynin  10 mg Oral QHS  . pantoprazole  40 mg Oral BH-q7a  . pneumococcal 23 valent vaccine  0.5 mL Intramuscular Tomorrow-1000  . polyethylene glycol  17 g Oral QPM  . predniSONE  30 mg Oral Q breakfast  . pregabalin  200 mg Oral BID  . senna  1 tablet Oral QPM  . tiotropium  18 mcg Inhalation Daily  . traZODone  100 mg Oral QHS  . venlafaxine XR  75 mg Oral Daily  . Vitamin D (Ergocalciferol)  50,000 Units Oral Q30 days    Objective: Vital signs in last 24 hours: Temp:  [97.8 F (36.6 C)-98.6 F (37 C)] 97.8 F (36.6 C) (12/06 1227) Pulse Rate:  [86-112] 112 (12/06 1227) Resp:  [17] 17 (12/06 1227) BP: (139-163)/(57-92) 154/92 mmHg (12/06 1227) SpO2:  [92 %-98 %] 92 % (12/06 1345) Weight:  [74.798 kg (164 lb 14.4 oz)] 74.798 kg (164 lb 14.4 oz) (12/06 0620) Constitutional: oriented to person, place, and time. appears  well-developed and well-nourished. No distress.  HENT: [place]/AT, PERRLA, no scleral icterus Mouth/Throat: Oropharynx is clear and moist. No oropharyngeal exudate.  Cardiovascular: Normal rate, regular rhythm and normal heart sounds.  Pulmonary/Chest: poor air movement  Neck = supple, no nuchal rigidity no lan Abdominal: Soft. Bowel sounds are normal. exhibits no distension. There is no tenderness.  Lymphadenopathy: no cervical adenopathy. No axillary adenopathy Neurological: alert and oriented to person, place, and time.  Skin: Skin is warm and dry. No rash noted. No erythema.  Psychiatric: a normal mood and affect. behavior is normal.   Lab Results  Recent Labs  01/05/15 0551 01/06/15 0353  WBC 10.2 8.8  HGB 11.0* 11.0*  HCT 33.7* 34.4*  NA 142 140  K 3.7 3.6  CL 110 109  CO2 26 26  BUN <5* 5*  CREATININE 0.58 0.54    Microbiology: Results for orders placed or performed during the hospital encounter of 12/15/14  Culture, bal-quantitative     Status: None   Collection Time: 12/15/14  5:23 PM  Result Value Ref Range Status   Specimen Description BRONCHIAL ALVEOLAR LAVAGE  Final   Special Requests Normal  Final   Gram Stain   Final    EXCELLENT SPECIMEN - 90-100% WBCS FEW  WBC SEEN NO ORGANISMS SEEN    Culture   Final    LIGHT GROWTH Consistent with normal respiratory flora.   Report Status 12/18/2014 FINAL  Final  Fungus Culture with Smear     Status: None   Collection Time: 12/15/14  5:23 PM  Result Value Ref Range Status   Specimen Description BRONCHIAL ALVEOLAR LAVAGE  Final   Special Requests Normal  Final   Fungal Smear YEAST IDENTIFICATION TO FOLLOW   Final   Culture CANDIDA GLABRATA  Final   Report Status 01/06/2015 FINAL  Final  Acid Fast Smear     Status: None   Collection Time: 12/15/14  5:23 PM  Result Value Ref Range Status   AFB Specimen Processing Concentration  Final   Acid Fast Smear Negative  Final    Comment: (NOTE) Performed At: [facility] [place], [place] [id number] Lindon Romp MD [id number]    Source (AFB) BRONCHIAL ALVEOLAR LAVAGE  Final    Studies/Results: PET 03/2013  Assessment/Plan: Emily Kidd is a 64 y.o. female with a hx of COPD, followed by Dr Raul Del for abnormal CT scan chest, prior CVA, Htn, prior tobacco use who was admitted initially 10/16 with several weeks of increasing cough, hemoptysis and chest pain, sob. Pt had been dxed with PNA as otpt and started on levofloxacin however sxs persisted and came to ED. CT done since admit shows infiltrate, as well as nodularity with one cavitary nodule (of note no comparison was done to prior CTs which have been abnormal. She had bcx with Strep Mitis and was dischaged on 10/21 on oral clindamycin. She had bronch done 11/14 and had one day admission following that for hypoxa - treated with pred taper. Of note her BAL specimen is neg for routine cx (nml flora), AFB and fungal with just C glabrata. US guided bxp of visible subcarinal LN was negative for malignacny  Readmitted 12/4 with increasing SOB and hypoxia. On admit was afebrile, wbc nml. No further hemoptysis.  Denies NS or fevers at home. Prior smoker but quit 2011 when had a CVA. Does report some dysphagia with foods since CVA. 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  Her CT scan continues to show LUL infiltrate and CXR shows today that it persists. She has not been on any abx as otpt.   I am not sure what the etiology of this process is but clearly it is persistent. I do not think it is TB as AFB negative, HIV negative. Malignancy is still a concern.  Speech and swallow has seen her and cleared her and states low prob of aspiration  Recommendations Continue clindamycin orally for probable recurrent aspiration - would suggest a 21 day course with fu with me as otpt for repeat imaging. Consider repeat PET scan as the process  in her LUL has progressed over the last several years without etiology. Consider CT surgery consult as well.   Defer to Dr Raul Del if need further bxp.   Thank you very much for the consult. Will follow with you.  [place], [place]   01/06/2015, 2:28 PM

## 2015-01-14 SURGERY — VIDEO BRONCHOSCOPY WITHOUT FLUORO
Anesthesia: Moderate Sedation

## 2015-01-14 MED ORDER — LIDOCAINE HCL 2 % EX GEL: 1.0000 "application " | Freq: Once | CUTANEOUS | Status: DC

## 2015-01-15 ENCOUNTER — Ambulatory Visit: Payer: Medicare (Managed Care)

## 2015-01-15 ENCOUNTER — Ambulatory Visit
Admission: RE | Admit: 2015-01-15 | Payer: Medicare (Managed Care) | Source: Ambulatory Visit | Admitting: Internal Medicine

## 2015-01-15 ENCOUNTER — Emergency Department: Payer: Medicare (Managed Care)

## 2015-01-15 ENCOUNTER — Encounter: Payer: Self-pay | Admitting: Emergency Medicine

## 2015-01-15 ENCOUNTER — Emergency Department
Admission: EM | Admit: 2015-01-15 | Discharge: 2015-01-15 | Disposition: A | Discharge status: Home / Self Care | Payer: Medicare (Managed Care) | Attending: Emergency Medicine | Admitting: Emergency Medicine

## 2015-01-15 DIAGNOSIS — Z7951 Long term (current) use of inhaled steroids: Secondary | ICD-10-CM | POA: Diagnosis not present

## 2015-01-15 DIAGNOSIS — R05 Cough: Secondary | ICD-10-CM

## 2015-01-15 DIAGNOSIS — Z7952 Long term (current) use of systemic steroids: Secondary | ICD-10-CM | POA: Diagnosis not present

## 2015-01-15 DIAGNOSIS — J441 Chronic obstructive pulmonary disease with (acute) exacerbation: Secondary | ICD-10-CM | POA: Diagnosis not present

## 2015-01-15 DIAGNOSIS — Z7984 Long term (current) use of oral hypoglycemic drugs: Secondary | ICD-10-CM | POA: Diagnosis not present

## 2015-01-15 DIAGNOSIS — R109 Unspecified abdominal pain: Secondary | ICD-10-CM | POA: Insufficient documentation

## 2015-01-15 DIAGNOSIS — Z880 Allergy status to penicillin: Secondary | ICD-10-CM | POA: Insufficient documentation

## 2015-01-15 DIAGNOSIS — E119 Type 2 diabetes mellitus without complications: Secondary | ICD-10-CM | POA: Insufficient documentation

## 2015-01-15 DIAGNOSIS — Z792 Long term (current) use of antibiotics: Secondary | ICD-10-CM | POA: Diagnosis not present

## 2015-01-15 DIAGNOSIS — Z79899 Other long term (current) drug therapy: Secondary | ICD-10-CM | POA: Insufficient documentation

## 2015-01-15 DIAGNOSIS — Z87891 Personal history of nicotine dependence: Secondary | ICD-10-CM | POA: Insufficient documentation

## 2015-01-15 DIAGNOSIS — R053 Chronic cough: Secondary | ICD-10-CM

## 2015-01-15 DIAGNOSIS — I10 Essential (primary) hypertension: Secondary | ICD-10-CM | POA: Insufficient documentation

## 2015-01-15 DIAGNOSIS — J159 Unspecified bacterial pneumonia: Secondary | ICD-10-CM | POA: Diagnosis not present

## 2015-01-15 DIAGNOSIS — J189 Pneumonia, unspecified organism: Secondary | ICD-10-CM

## 2015-01-15 DIAGNOSIS — R042 Hemoptysis: Secondary | ICD-10-CM | POA: Diagnosis present

## 2015-01-15 LAB — COMPREHENSIVE METABOLIC PANEL
ALK PHOS: 103 U/L (ref 38–126)
ALT: 14 U/L (ref 14–54)
ANION GAP: 7 (ref 5–15)
AST: 16 U/L (ref 15–41)
Albumin: 3.4 g/dL — ABNORMAL LOW (ref 3.5–5.0)
BUN: 8 mg/dL (ref 6–20)
CHLORIDE: 110 mmol/L (ref 101–111)
CO2: 24 mmol/L (ref 22–32)
Calcium: 9 mg/dL (ref 8.9–10.3)
Creatinine, Ser: 0.66 mg/dL (ref 0.44–1.00)
GFR calc non Af Amer: >60 mL/min (ref >60)
Glucose, Bld: 97 mg/dL (ref 65–99)
POTASSIUM: 4.1 mmol/L (ref 3.5–5.1)
SODIUM: 141 mmol/L (ref 135–145)
TOTAL PROTEIN: 6.7 g/dL (ref 6.5–8.1)
Total Bilirubin: 0.8 mg/dL (ref 0.3–1.2)

## 2015-01-15 LAB — CBC WITH DIFFERENTIAL/PLATELET
BASOS ABS: 0 10*3/uL (ref 0–0.1)
BASOS PCT: 0 %
Eosinophils Absolute: 0.2 10*3/uL (ref 0–0.7)
Eosinophils Relative: 3 %
HEMATOCRIT: 36.6 % (ref 35.0–47.0)
Hemoglobin: 11.8 g/dL — ABNORMAL LOW (ref 12.0–16.0)
LYMPHS PCT: 13 %
Lymphs Abs: 0.9 10*3/uL — ABNORMAL LOW (ref 1.0–3.6)
MCH: 28 pg (ref 26.0–34.0)
MCHC: 32.4 g/dL (ref 32.0–36.0)
MCV: 86.4 fL (ref 80.0–100.0)
MONO ABS: 0.8 10*3/uL (ref 0.2–0.9)
Monocytes Relative: 11 %
NEUTROS ABS: 5 10*3/uL (ref 1.4–6.5)
Neutrophils Relative %: 73 %
PLATELETS: 333 10*3/uL (ref 150–440)
RBC: 4.23 MIL/uL (ref 3.80–5.20)
RDW: 16 % — AB (ref 11.5–14.5)
WBC: 6.9 10*3/uL (ref 3.6–11.0)

## 2015-01-15 LAB — LIPASE, BLOOD: LIPASE: 24 U/L (ref 11–51)

## 2015-01-15 MED ORDER — IPRATROPIUM-ALBUTEROL 0.5-2.5 (3) MG/3ML IN SOLN
3.0000 mL | Freq: Once | RESPIRATORY_TRACT | Status: AC
  Administered 2015-01-15: 3 mL via RESPIRATORY_TRACT
  Filled 2015-01-15: qty 3

## 2015-01-15 MED ORDER — LEVOFLOXACIN 750 MG PO TABS: 750.0000 mg | ORAL_TABLET | Freq: Every day | ORAL | Status: DC

## 2015-01-15 MED ORDER — GI COCKTAIL ~~LOC~~
30.0000 mL | ORAL | Status: AC
  Administered 2015-01-15: 30 mL via ORAL
  Filled 2015-01-15: qty 30

## 2015-01-15 NOTE — ED Notes (Addendum)
Report given over phone by Dr. Royanne Foots from [facility]. Dr. Shann Medal pt has had hemoptysis x1 month, pt is being treated, pt refused bronchoscopy yesterday, rescheduled for the 19th. Dr. Shann Medal pt is coming into the ED for anxiety and continuous hemoptysis. EMS reports pt from home with productive cough that produces blood stained sputum. 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 Vitals in route: 136/74, HR 74, Resp. 18. Pt at triage is alert and no distress noted.

## 2015-01-15 NOTE — ED Notes (Signed)
[facility] called for pt to be picked up.

## 2015-01-15 NOTE — ED Provider Notes (Signed)
[facility] Emergency Department Provider Note  ____________________________________________  Time seen: 9:50 AM on arrival by EMS  I have reviewed the triage vital signs and the nursing notes.   HISTORY  Chief Complaint Hemoptysis    HPI Emily Kidd is a 64 y.o. female who complains of hemoptysis, "a taste of blood in my mouth", and generalized abdominal pain. She reports this is been ongoing and has been an issue for about a year. No changes. She was scheduled to have a bronchoscopy yesterday but the patient canceled and so was rescheduled for December 19. She's had 2 prior TB test which were negative according to [facility] [place] who actually advised her not to come to the hospital today as she did not seem to have any acute complaints. The family insisted on evaluation and so the patient was transported by EMS. The patient denies any acute changes and only her chronic abdominal pain with hemoptysis.     Past Medical History  Diagnosis Date  . Hypertension   . COPD (chronic obstructive pulmonary disease) ([place])   . Pneumonia   . Sepsis ([place])   . Lung mass   . Diabetes mellitus without complication ([place])   . Dysrhythmia     TACHYCARDIA  . GERD (gastroesophageal reflux disease)   . Mitral regurgitation   . Carotid artery stenosis   . Muscle spasms of neck   . TIA (transient ischemic attack)   . Anxiety   . Hyperlipidemia   . Major depressive disorder ([place])   . Chronic pain syndrome   . Drug abuse in remission   . Vertigo   . Stable angina (H[facility])   . Neuropathy ([place])   . Left-sided weakness     FROM TIA     Patient Active Problem List   Diagnosis Date Noted  . Chronic obstructive pulmonary disease with acute exacerbation ([place]) 01/06/2015  . Hypoxia 12/15/2014  . Adenopathy   . Sepsis ([place]) 11/21/2014  . Acute respiratory failure with hypoxia ([place]) 11/21/2014  . Encephalopathy, metabolic 84/16/6063  . Left upper lobe pneumonia  11/21/2014  . Hemoptysis 11/21/2014  . Pneumonia 11/16/2014  . COPD (chronic obstructive pulmonary disease) ([place]) 11/16/2014  . GERD (gastroesophageal reflux disease) 11/16/2014  . Peripheral neuropathy ([place]) 11/16/2014  . Peripheral vascular disease ([place]) 11/16/2014     Past Surgical History  Procedure Laterality Date  . Endobronchial ultrasound N/A 11/24/2014    Procedure: ENDOBRONCHIAL ULTRASOUND;  Surgeon: Flora Lipps, MD;  Location: [facility] ORS;  Service: Cardiopulmonary;  Laterality: N/A;  . Above knee amputation Left   . Endobronchial ultrasound N/A 12/15/2014    Procedure: ENDOBRONCHIAL ULTRASOUND;  Surgeon: Flora Lipps, MD;  Location: [facility] ORS;  Service: Cardiopulmonary;  Laterality: N/A;     Current Outpatient Rx  Name  Route  Sig  Dispense  Refill  . acetaminophen (TYLENOL) 650 MG CR tablet   Oral   Take 650 mg by mouth 3 (three) times daily.         Marland Kitchen albuterol (PROVENTIL) (2.5 MG/3ML) 0.083% nebulizer solution   Nebulization   Take 2.5 mg by nebulization every 4 (four) hours as needed for shortness of breath.          Marland Kitchen alum & mag hydroxide-simeth (MAALOX ADVANCED MAX ST) 400-400-40 MG/5ML suspension   Oral   Take 15 mLs by mouth 4 (four) times daily as needed (nausea).          Marland Kitchen atorvastatin (LIPITOR) 40 MG tablet  Oral   Take 40 mg by mouth at bedtime.         . benzocaine (HURRICAINE) 20 % GEL   Mouth/Throat   Use as directed 1 application in the mouth or throat 3 (three) times daily as needed (for pain).          . benzonatate (TESSALON) 100 MG capsule   Oral   Take 100 mg by mouth 3 (three) times daily as needed for cough.         . camphor-menthol (SARNA) lotion   Topical   Apply 1 application topically 4 (four) times daily as needed for itching.         . Carboxymethylcellul-Glycerin (OPTIVE) 0.5-0.9 % SOLN   Ophthalmic   Apply 1 drop to eye See admin instructions. 3 to 4 times daily as needed for dryness/irritation         .  carvedilol (COREG) 3.125 MG tablet   Oral   Take 3.125 mg by mouth 2 (two) times daily.          . clindamycin (CLEOCIN) 300 MG capsule   Oral   Take 1 capsule (300 mg total) by mouth 3 (three) times daily.   63 capsule   0   . Diaper Rash Products (DESITIN) OINT   Topical   Apply 1 application topically every morning. To buttocks         . diclofenac sodium (VOLTAREN) 1 % GEL   Topical   Apply 2 g topically every 8 (eight) hours as needed (for pain).          Marland Kitchen docusate sodium (COLACE) 100 MG capsule   Oral   Take 100 mg by mouth daily as needed for mild constipation.         Marland Kitchen levofloxacin (LEVAQUIN) 750 MG tablet   Oral   Take 1 tablet (750 mg total) by mouth daily.   7 tablet   0   . lidocaine (LIDODERM) 5 %   Transdermal   Place 1 patch onto the skin at bedtime. Remove & Discard patch within 12 hours or as directed by MD         . loperamide (IMODIUM A-D) 2 MG capsule   Oral   Take 2 mg by mouth as needed for diarrhea or loose stools (2 by mouth after 1st loose stool, then 1 following each subsequent loose stool).         . Melatonin 3 MG TABS   Oral   Take 1 tablet by mouth at bedtime.         . metFORMIN (GLUCOPHAGE) 500 MG tablet   Oral   Take 500 mg by mouth daily.         . miconazole (ZEASORB-AF) 2 % powder   Topical   Apply 1 application topically every morning. To thoroughly dry groin after bath         . nitroGLYCERIN (NITROSTAT) 0.4 MG SL tablet   Sublingual   Place 1 tablet (0.4 mg total) under the tongue every 5 (five) minutes as needed for chest pain.   10 tablet   12   . Omega-3 Fatty Acids (OMEGA-3 FISH OIL PO)   Oral   Take 2 capsules by mouth daily.         Marland Kitchen oxybutynin (DITROPAN-XL) 10 MG 24 hr tablet   Oral   Take 10 mg by mouth at bedtime.          . pantoprazole (PROTONIX) 40 MG  tablet   Oral   Take 40 mg by mouth every morning.          . polyethylene glycol (MIRALAX / GLYCOLAX) packet   Oral   Take  17 g by mouth every evening.          . predniSONE (DELTASONE) 10 MG tablet   Oral   Take 3 tablets (30 mg total) by mouth daily with breakfast.   21 tablet   0   . pregabalin (LYRICA) 150 MG capsule   Oral   Take 150 mg by mouth 2 (two) times daily. Along with lyrica 50 mg         . pregabalin (LYRICA) 200 MG capsule   Oral   Take 200 mg by mouth.         . pregabalin (LYRICA) 50 MG capsule   Oral   Take 50 mg by mouth 2 (two) times daily. Along with Lyrica 150 mg         . senna (SENOKOT) 8.6 MG TABS tablet   Oral   Take 1 tablet by mouth every evening.          . sodium chloride (OCEAN) 0.65 % SOLN nasal spray   Each Nare   Place 1-2 sprays into both nostrils as needed for congestion.         Marland Kitchen tiotropium (SPIRIVA HANDIHALER) 18 MCG inhalation capsule   Inhalation   Place 18 mcg into inhaler and inhale daily.         . traZODone (DESYREL) 100 MG tablet   Oral   Take 100 mg by mouth at bedtime.         Marland Kitchen venlafaxine XR (EFFEXOR-XR) 75 MG 24 hr capsule   Oral   Take 75 mg by mouth daily.         . Vitamin D, Ergocalciferol, (DRISDOL) 50000 UNITS CAPS capsule   Oral   Take 50,000 Units by mouth every 30 (thirty) days. On the first Monday of each month            Allergies Azithromycin and Penicillins   Family History  Problem Relation Age of Onset  . Heart attack Father   . Stroke Father     Social History Social History  Substance Use Topics  . Smoking status: Former Research scientist (life sciences)  . Smokeless tobacco: None  . Alcohol Use: No    Review of Systems  Constitutional:   No fever or chills. No weight changes Eyes:   No blurry vision or double vision.  ENT:   No sore throat. Cardiovascular:   No chest pain. Respiratory:   No dyspnea positive hemoptysis Gastrointestinal:   Positive for abdominal pain, without vomiting and diarrhea.  No BRBPR or melena. Genitourinary:   Negative for dysuria, urinary retention, bloody urine, or difficulty  urinating. Musculoskeletal:   Negative for back pain. No joint swelling or pain. Skin:   Negative for rash. Neurological:   Negative for headaches, focal weakness or numbness. Psychiatric:  No anxiety or depression.   Endocrine:  No hot/cold intolerance, changes in energy, or sleep difficulty.  10-point ROS otherwise negative.  ____________________________________________   PHYSICAL EXAM:  VITAL SIGNS: ED Triage Vitals  Enc Vitals Group     BP 01/15/15 0955 97/83 mmHg     Pulse Rate 01/15/15 0955 90     Resp 01/15/15 0955 16     Temp 01/15/15 0955 98.4 F (36.9 C)     Temp Source 01/15/15 0955 Oral  SpO2 01/15/15 0955 95 %     Weight 01/15/15 0955 170 lb (77.111 kg)     Height 01/15/15 0955 '5\' 4"'$  (1.626 m)     Head Cir --      Peak Flow --      Pain Score 01/15/15 0956 7     Pain Loc --      Pain Edu? --      Excl. in [place]? --      Constitutional:   Alert and oriented. Well appearing and in no distress. Eyes:   No scleral icterus. No conjunctival pallor. PERRL. EOMI ENT   Head:   Normocephalic and atraumatic.   Nose:   No congestion/rhinnorhea. No septal hematoma, no epistaxis   Mouth/Throat:   MMM, no pharyngeal erythema. No peritonsillar mass. No uvula shift. No blood in the oropharynx   Neck:   No stridor. No SubQ emphysema. No meningismus. Hematological/Lymphatic/Immunilogical:   No cervical lymphadenopathy. Cardiovascular:   RRR. Normal and symmetric distal pulses are present in all extremities. No murmurs, rubs, or gallops. Respiratory:   Normal respiratory effort without tachypnea nor retractions. Breath sounds are clear and equal bilaterally except for mild expiratory wheezing diffusely. No focal findings  Gastrointestinal:   Soft and nontender. No distention. There is no CVA tenderness.  No rebound, rigidity, or guarding. Genitourinary:   deferred Musculoskeletal:   Nontender with normal range of motion in all extremities. No joint effusions.  No  lower extremity tenderness.  No edema. Neurologic:   Normal speech and language.  CN 2-10 normal. Motor grossly intact. No pronator drift.  Normal gait. No gross focal neurologic deficits are appreciated.  Skin:    Skin is warm, dry and intact. No rash noted.  No petechiae, purpura, or bullae. Psychiatric:   Mood and affect are normal. Speech and behavior are normal. Patient exhibits appropriate insight and judgment.  ____________________________________________    LABS (pertinent positives/negatives) (all labs ordered are listed, but only abnormal results are displayed) Labs Reviewed  COMPREHENSIVE METABOLIC PANEL - Abnormal; Notable for the following:    Albumin 3.4 (*)    All other components within normal limits  CBC WITH DIFFERENTIAL/PLATELET - Abnormal; Notable for the following:    Hemoglobin 11.8 (*)    RDW 16.0 (*)    Lymphs Abs 0.9 (*)    All other components within normal limits  LIPASE, BLOOD   ____________________________________________   EKG  Interpreted by me  Date: 01/15/2015  Rate: 92  Rhythm: normal sinus rhythm  QRS Axis: normal  Intervals: normal  ST/T Wave abnormalities: normal  Conduction Disutrbances: none  Narrative Interpretation: unremarkable      ____________________________________________    RADIOLOGY  Chest x-ray concerning for right upper lobe consolidation, persistent from previous  ____________________________________________   PROCEDURES   ____________________________________________   INITIAL IMPRESSION / ASSESSMENT AND PLAN / ED COURSE  Pertinent labs & imaging results that were available during my care of the patient were reviewed by me and considered in my medical decision making (see chart for details).  Patient presents with chronic abdominal pain and hemoptysis for which she is undergoing outpatient workup. She has a bronchoscopy coming up soon after she canceled her most recent one. No acute complaints. She  is little bit wheezy consistent with her underlying COPD. We'll check a chest x-ray give her a DuoNeb and check some labs given her belly pain and her comorbidities. I anticipate that if workup does not show any severe abnormalities she will be stable  for discharge home with [facility].  ----------------------------------------- 10:48 AM on 01/15/2015 -----------------------------------------  Vital signs stable. No clinical evidence of pneumonia or sepsis, but x-ray does show consolidation of the right upper lobe suggestive of pneumonia. We'll start her on Levaquin but she does not appear to require hospitalization at this time. He is strongly encouraged her to follow up with her pulmonologist and get her bronchoscopy as scheduled, now on December 19. Labs are otherwise unremarkable, EKG unremarkable. We'll discharge home to follow up with primary care tomorrow.  ____________________________________________   FINAL CLINICAL IMPRESSION(S) / ED DIAGNOSES  Final diagnoses:  Chronic cough  CAP (community acquired pneumonia)      Carrie Mew, MD 01/15/15 1049

## 2015-01-15 NOTE — Discharge Instructions (Signed)

## 2015-01-16 ENCOUNTER — Other Ambulatory Visit: Payer: Medicare (Managed Care)

## 2015-01-19 ENCOUNTER — Encounter: Admission: RE | Payer: Self-pay | Source: Ambulatory Visit

## 2015-01-19 ENCOUNTER — Ambulatory Visit
Admission: RE | Admit: 2015-01-19 | Payer: Medicare (Managed Care) | Source: Ambulatory Visit | Admitting: Internal Medicine

## 2015-01-19 SURGERY — BRONCHOSCOPY, FLEXIBLE
Anesthesia: General

## 2015-01-29 ENCOUNTER — Inpatient Hospital Stay: Payer: Medicare (Managed Care) | Admitting: Cardiothoracic Surgery

## 2015-01-30 ENCOUNTER — Encounter: Payer: Self-pay | Admitting: Cardiothoracic Surgery

## 2015-01-30 ENCOUNTER — Inpatient Hospital Stay: Payer: Medicare (Managed Care) | Attending: Cardiothoracic Surgery | Admitting: Cardiothoracic Surgery

## 2015-01-30 ENCOUNTER — Encounter: Payer: Self-pay | Admitting: *Deleted

## 2015-01-30 ENCOUNTER — Other Ambulatory Visit: Payer: Self-pay | Admitting: *Deleted

## 2015-01-30 VITALS — BP 139/72 | HR 98 | Temp 96.8°F | Resp 18 | Ht 64.0 in

## 2015-01-30 DIAGNOSIS — R918 Other nonspecific abnormal finding of lung field: Secondary | ICD-10-CM

## 2015-01-30 NOTE — Progress Notes (Signed)
Met with patient and [facility] caregiver at thoracic surgery appointment. Introduced Programmer, multimedia and reviewed plan of care. Will follow.

## 2015-01-30 NOTE — Progress Notes (Signed)
Patient ID: Emily Kidd, female   DOB: 24-Aug-1950, 64 y.o.   MRN: [id number]  Chief Complaint  Patient presents with  . Mediastinal Node    Dr. Whitman Hero    Referred By Dr. Raul Del Reason for Referral lung biopsy  HPI Location, Quality, Duration, Severity, Timing, Context, Modifying Factors, Associated Signs and Symptoms.  Emily Kidd is a 64 y.o. female.  She states that her problems began about 6 weeks ago when she began experiencing hemoptysis. She has been a patient of Dr. Gust Brooms for many years and over the last several years multiple CT scans been performed. More recently she's had chest x-rays and CT scans for evaluation of her hemoptysis which have shown left upper lobe predominant interstitial process as well as some areas in the left lower lobe and in the right lung as well. In addition there is extensive mediastinal adenopathy and the patient underwent a CT scan a few weeks ago which prompted a endobronchial ultrasound guided biopsy of the mediastinal nodes. I discussed these results with Dr. Louie Bun. Dr. Stoney Bang felt that the biopsies of the mediastinal nodes were representative. He therefore was requested a lung biopsy be performed to determine the etiology of her underlying hemoptysis. This patient has extensive comorbid conditions. 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 It's not clear whether these were simultaneous events but in any event she has been essentially wheelchair-bound ever since. She lives at home with her mother. She does not smoke. She states that her hemoptysis is been no better or worse over the last several weeks. She's been tried on multiple courses of antibiotics and prednisone to no avail.   Past Medical History  Diagnosis Date  . Hypertension   . COPD (chronic obstructive pulmonary disease) ([place])   .  Pneumonia   . Sepsis ([place])   . Lung mass   . Diabetes mellitus without complication ([place])   . Dysrhythmia     TACHYCARDIA  . GERD (gastroesophageal reflux disease)   . Mitral regurgitation   . Carotid artery stenosis   . Muscle spasms of neck   . TIA (transient ischemic attack)   . Anxiety   . Hyperlipidemia   . Major depressive disorder ([place])   . Chronic pain syndrome   . Drug abuse in remission   . Vertigo   . Stable angina ([facility])   . Neuropathy ([place])   . Left-sided weakness     FROM TIA    Past Surgical History  Procedure Laterality Date  . Endobronchial ultrasound N/A 11/24/2014    Procedure: ENDOBRONCHIAL ULTRASOUND;  Surgeon: Flora Lipps, MD;  Location: [facility] ORS;  Service: Cardiopulmonary;  Laterality: N/A;  . Above knee amputation Left   . Endobronchial ultrasound N/A 12/15/2014    Procedure: ENDOBRONCHIAL ULTRASOUND;  Surgeon: Flora Lipps, MD;  Location: [facility] ORS;  Service: Cardiopulmonary;  Laterality: N/A;    Family History  Problem Relation Age of Onset  . Heart attack Father   . Stroke Father     Social History Social History  Substance Use Topics  . Smoking status: Former Smoker    Quit date: 09/30/2008  . Smokeless tobacco: Not on file  . Alcohol Use: No    Allergies  Allergen Reactions  . Azithromycin   . Penicillins Rash    Current Outpatient Prescriptions  Medication Sig Dispense Refill  . acetaminophen (TYLENOL) 650 MG CR tablet Take 650 mg by mouth 3 (three) times daily.    Marland Kitchen albuterol (PROVENTIL) (2.5 MG/3ML) 0.083% nebulizer solution Take 2.5 mg by nebulization every 4 (four) hours as needed for shortness of breath.     Marland Kitchen alum & mag hydroxide-simeth (MAALOX ADVANCED MAX ST) 400-400-40 MG/5ML suspension Take 15 mLs by mouth 4 (four) times daily as needed (nausea).     Marland Kitchen atorvastatin (LIPITOR) 40 MG tablet Take 40 mg by mouth at bedtime.    . benzocaine (HURRICAINE) 20 % GEL Use as directed 1 application in the mouth or throat 3 (three)  times daily as needed (for pain).     . benzonatate (TESSALON) 100 MG capsule Take 100 mg by mouth 3 (three) times daily as needed for cough.    . camphor-menthol (SARNA) lotion Apply 1 application topically 4 (four) times daily as needed for itching.    . Carboxymethylcellul-Glycerin (OPTIVE) 0.5-0.9 % SOLN Apply 1 drop to eye See admin instructions. 3 to 4 times daily as needed for dryness/irritation    . carvedilol (COREG) 3.125 MG tablet Take 3.125 mg by mouth 2 (two) times daily.     . clindamycin (CLEOCIN) 300 MG capsule Take 1 capsule (300 mg total) by mouth 3 (three) times daily. 63 capsule 0  . Diaper Rash Products (DESITIN) OINT Apply 1 application topically every morning. To buttocks    . diclofenac sodium (VOLTAREN) 1 % GEL Apply 2 g topically every 8 (eight) hours as needed (for pain).     Marland Kitchen docusate sodium (COLACE) 100 MG capsule Take 100 mg by mouth daily as needed for mild constipation.    Marland Kitchen levofloxacin (LEVAQUIN) 750 MG tablet Take 1 tablet (750 mg total) by mouth daily. 7 tablet 0  . lidocaine (LIDODERM) 5 % Place 1 patch onto the skin at bedtime. Remove & Discard patch within 12 hours or as directed by MD    . loperamide (IMODIUM A-D) 2 MG capsule Take 2 mg by mouth as needed for diarrhea or loose stools (2 by mouth after 1st loose stool, then 1 following each subsequent loose stool).    . Melatonin 3 MG TABS Take 1 tablet by mouth at bedtime.    . metFORMIN (GLUCOPHAGE) 500 MG tablet Take 500 mg by mouth daily.    . miconazole (ZEASORB-AF) 2 % powder Apply 1 application topically every morning. To thoroughly dry groin after bath    . nitroGLYCERIN (NITROSTAT) 0.4 MG SL tablet Place 1 tablet (0.4 mg total) under the tongue every 5 (five) minutes as needed for chest pain. 10 tablet 12  . Omega-3 Fatty Acids (OMEGA-3 FISH OIL PO) Take 2 capsules by mouth daily.    Marland Kitchen oxybutynin (DITROPAN-XL) 10 MG 24 hr tablet Take 10 mg by mouth at bedtime.     . pantoprazole (PROTONIX) 40 MG  tablet Take 40 mg by mouth every morning.     . polyethylene glycol (MIRALAX / GLYCOLAX) packet Take 17 g by mouth every evening.     . predniSONE (DELTASONE) 10 MG tablet Take 3 tablets (30 mg total) by mouth daily with breakfast. 21 tablet 0  . pregabalin (LYRICA) 150 MG capsule Take 150 mg by mouth 2 (two) times daily. Along with lyrica 50 mg    . pregabalin (LYRICA) 200 MG capsule Take 200 mg by mouth.    . pregabalin (LYRICA) 50 MG capsule Take 50 mg by mouth 2 (two) times  daily. Along with Lyrica 150 mg    . senna (SENOKOT) 8.6 MG TABS tablet Take 1 tablet by mouth every evening.     . sodium chloride (OCEAN) 0.65 % SOLN nasal spray Place 1-2 sprays into both nostrils as needed for congestion.    Marland Kitchen tiotropium (SPIRIVA HANDIHALER) 18 MCG inhalation capsule Place 18 mcg into inhaler and inhale daily.    . traZODone (DESYREL) 100 MG tablet Take 100 mg by mouth at bedtime.    Marland Kitchen venlafaxine XR (EFFEXOR-XR) 75 MG 24 hr capsule Take 75 mg by mouth daily.    . Vitamin D, Ergocalciferol, (DRISDOL) 50000 UNITS CAPS capsule Take 50,000 Units by mouth every 30 (thirty) days. On the first Monday of each month     No current facility-administered medications for this visit.   Facility-Administered Medications Ordered in Other Visits  Medication Dose Route Frequency Provider Last Rate Last Dose  . lidocaine (XYLOCAINE) 2 % jelly 1 application  1 application Topical Once Flora Lipps, MD          Review of Systems A complete review of systems was asked and was negative except for the following positive findings loss of appetite, chest pain, shortness of breath, cough, coughing up blood, wheezing, nausea, headaches,  Blood pressure 139/72, pulse 98, temperature 96.8 F (36 C), temperature source Tympanic, resp. rate 18, height '5\' 4"'$  (1.626 m).  Physical Exam CONSTITUTIONAL:  Pleasant, well-developed, well-nourished, and in no acute distress. EYES: Pupils equal and reactive to light, Sclera  non-icteric EARS, NOSE, MOUTH AND THROAT:  The oropharynx was clear.   LYMPH NODES:  Lymph nodes in the neck and axillae were normal RESPIRATORY:  Lungs were clear.  Normal respiratory effort without pathologic use of accessory muscles of respiration CARDIOVASCULAR: Heart was regular without murmurs.  There were no carotid bruits. GI: The abdomen was soft, nontender, and nondistended. There were no palpable masses. There was no hepatosplenomegaly. There were normal bowel sounds in all quadrants. GU:  Rectal deferred.   MUSCULOSKELETAL:  Normal muscle strength and tone.  No clubbing or cyanosis.  Left lower extremity amputation   SKIN:  There were no pathologic skin lesions.  There were no nodules on palpation. NEUROLOGIC:  Sensation is normal.  Cranial nerves are grossly intact.  She has paralysis of her left upper extremity PSYCH:  Oriented to person, place and time.  Mood and affect are normal.  Data Reviewed Multiple CT scans  I have personally reviewed the patient's imaging, laboratory findings and medical records.    Assessment    Alveolar hemorrhage. I had a long discussion with the patient today regarding the options. I discussed her care with Dr. Louie Bun who believes that a lung biopsy would be indicated. Biopsing the mediastinal nodes may not be as easy as performing the lung biopsy. I explained to the patient I would recommend we perform a left-sided thoracoscopic possible thoracotomy for a lung biopsy. I explained to her that we would send the specimen for appropriate histologic and microbiologic testing. She understands and would like to proceed. We'll check some routine laboratory studies today. We'll schedule her for surgery within the next 2 weeks.    Plan    The patient would like proceed with surgery as soon as possible. She gave consent for a left thoracoscopy possible thoracotomy and lung biopsy.      Nestor Lewandowsky, MD 01/30/2015, 2:36 PM

## 2015-02-03 ENCOUNTER — Telehealth: Payer: Self-pay | Admitting: *Deleted

## 2015-02-03 ENCOUNTER — Telehealth: Payer: Self-pay | Admitting: Cardiothoracic Surgery

## 2015-02-03 NOTE — Telephone Encounter (Signed)
Patient called requesting to know when her surgery date would be scheduled. Pt expresses that she is concerned that "no one has notified her of the procedure date. I've been waiting to hear since Friday." Explained to patient that Angie at [facility] will be contacting her as soon as her procedure is set up. The patient saw Dr. Genevive Bi on Friday. Explained to patient that the delay for notification is r/t the holiday clinic closings. She was reassured that [facility] will contact her as soon as the procedure date is arranged.

## 2015-02-03 NOTE — Telephone Encounter (Signed)
Emily Kidd from [facility] was advised of pre op date/time and sx date. She will make all transportation arrangements and contact the family with this information.  Sx: 02/11/15 with Dr Oaks--Dr Burt Knack assisting--Left thoracoscopy--with possible thoracotomy with left lung biopsy.  Pre op: 02/09/15 @ 11:00am--Office.

## 2015-02-05 ENCOUNTER — Other Ambulatory Visit: Payer: Medicare (Managed Care)

## 2015-02-05 ENCOUNTER — Ambulatory Visit: Payer: Medicare (Managed Care) | Admitting: Cardiothoracic Surgery

## 2015-02-06 ENCOUNTER — Telehealth: Payer: Self-pay | Admitting: *Deleted

## 2015-02-06 NOTE — Telephone Encounter (Signed)
Received call from Dr. Coralie Common with [facility] with weather concerns and scheduled preop appointment. Also,patient is able to have labs, ekg, and chest xray at their facility. Dr. Coralie Common is to email/ fax test results to me, discussed preop and rescheduled for Tuesday 02/10/15 at 1pm. Notified Quentin Cornwall at [facility]. Preop nurse is aware of testing done at [facility].

## 2015-02-09 ENCOUNTER — Other Ambulatory Visit: Payer: Medicare (Managed Care)

## 2015-02-10 ENCOUNTER — Inpatient Hospital Stay: Admission: RE | Admit: 2015-02-10 | Payer: Medicare (Managed Care) | Source: Ambulatory Visit

## 2015-02-10 NOTE — Patient Instructions (Signed)
  Your procedure is scheduled on: 02-11-15 Report to [facility] @ 8:15- TIME GIVEN VIA PHONE TO CRYSTAL   Remember: Instructions that are not followed completely may result in serious medical risk, up to and including death, or upon the discretion of your surgeon and anesthesiologist your surgery may need to be rescheduled.    _X___ 1. Do not eat food or drink liquids after midnight. No gum chewing or hard candies.     ___ 2. No Alcohol for 24 hours before or after surgery.   ____ 3. Bring all medications with you on the day of surgery if instructed.    ____ 4. Notify your doctor if there is any change in your medical condition     (cold, fever, infections).     Do not wear jewelry, make-up, hairpins, clips or nail polish.  Do not wear lotions, powders, or perfumes. You may wear deodorant.  Do not shave 48 hours prior to surgery. Men may shave face and neck.  Do not bring valuables to the hospital.    [facility] is not responsible for any belongings or valuables.               Contacts, dentures or bridgework may not be worn into surgery.  Leave your suitcase in the car. After surgery it may be brought to your room.  For patients admitted to the hospital, discharge time is determined by your treatment team.   Patients discharged the day of surgery will not be allowed to drive home.   Please read over the following fact sheets that you were given:     _X___ Take these medicines the morning of surgery with A SIP OF WATER:    1. PANTOPRAZOLE  2. CARVEDILOL  3. EFFEXOR  4. LYRICA  5.  6.  ____ Fleet Enema (as directed)   ____ Use CHG Soap as directed  _X___ Use inhalers on the day of surgery-USE NEBULIZER AT [place]  _X___ Stop metformin 2 days prior to surgery-STOP NOW    ____ Take 1/2 of usual insulin dose the night before surgery and none on the morning of surgery.   ____ Stop  Coumadin/Plavix/aspirin-FACILITY TO CHECK ABOUT PLAVIX-UNSURE IF PT IS STILL TAKING IT  ____ Stop Anti-inflammatories   _X__ Stop supplements until after surgery-STOP MELATONIN NOW  ____ Bring C-Pap to the hospital.

## 2015-02-13 ENCOUNTER — Telehealth: Payer: Self-pay | Admitting: Cardiothoracic Surgery

## 2015-02-13 NOTE — Telephone Encounter (Signed)
Pt advised of pre op date/time and sx date. Sx: 02/24/15 with Dr Genevive Bi, Dr Adonis Huguenin assisting--Left thoracoscopy with possible thoracotomy with left lung biopsy. Pre op: 02/10/15--Phone.

## 2015-02-16 ENCOUNTER — Telehealth: Payer: Self-pay

## 2015-02-16 NOTE — Telephone Encounter (Signed)
Spoke with Heather in Pre-admit at this time. Apparently patient needs to come in to have a pre-op visit to have labwork done and see anesthesia prior to her scheduled 02/24/15 Thoracotomy with Dr. Genevive Bi.   Call was made to [facility] at this time who is responsible for patient's care. [phone]. Office is closed today for [employer] [facility]. Message left for return phone call tomorrow when they re-open to get this appointment scheduled.

## 2015-02-16 NOTE — Telephone Encounter (Signed)
Judeen Hammans returned my phone call from the [facility] at this time. Appointment was made for patient to come in for office pre-admit on 02/18/15 at 12:15pm. Judeen Hammans states that they will arrange transportation and communicate this information with the patient's family.  Spoke with Junie Panning in Pre-admit and they are aware of this appointment.

## 2015-02-18 ENCOUNTER — Encounter
Admission: RE | Admit: 2015-02-18 | Discharge: 2015-02-18 | Disposition: A | Discharge status: Home / Self Care | Payer: Medicare (Managed Care) | Source: Ambulatory Visit | Attending: Cardiothoracic Surgery | Admitting: Cardiothoracic Surgery

## 2015-02-18 DIAGNOSIS — Z01812 Encounter for preprocedural laboratory examination: Secondary | ICD-10-CM | POA: Diagnosis present

## 2015-02-18 HISTORY — DX: Reserved for inherently not codable concepts without codable children: IMO0001

## 2015-02-18 HISTORY — DX: Atherosclerotic heart disease of native coronary artery without angina pectoris: I25.10

## 2015-02-18 LAB — TYPE AND SCREEN
ABO/RH(D): O POS
Antibody Screen: NEGATIVE

## 2015-02-18 LAB — ABO/RH: ABO/RH(D): O POS

## 2015-02-18 LAB — SURGICAL PCR SCREEN
MRSA, PCR: NEGATIVE
STAPHYLOCOCCUS AUREUS: NEGATIVE

## 2015-02-18 NOTE — Patient Instructions (Addendum)
  Your procedure is scheduled on: 02/24/15 Tues Report to Day Surgery.2nd floor medical mall To find out your arrival time please call [phone] between 1PM - 3PM on 02/23/15 Mon.  Remember: Instructions that are not followed completely may result in serious medical risk, up to and including death, or upon the discretion of your surgeon and anesthesiologist your surgery may need to be rescheduled.    _x___ 1. Do not eat food or drink liquids after midnight. No gum chewing or hard candies.     ____ 2. No Alcohol for 24 hours before or after surgery.   ____ 3. Bring all medications with you on the day of surgery if instructed.    __x__ 4. Notify your doctor if there is any change in your medical condition     (cold, fever, infections).     Do not wear jewelry, make-up, hairpins, clips or nail polish.  Do not wear lotions, powders, or perfumes. You may wear deodorant.  Do not shave 48 hours prior to surgery. Men may shave face and neck.  Do not bring valuables to the hospital.    [facility] is not responsible for any belongings or valuables.               Contacts, dentures or bridgework may not be worn into surgery.  Leave your suitcase in the car. After surgery it may be brought to your room.  For patients admitted to the hospital, discharge time is determined by your                treatment team.   Patients discharged the day of surgery will not be allowed to drive home.   Please read over the following fact sheets that you were given:      _x___ Take these medicines the morning of surgery with A SIP OF WATER:    1.albuterol (PROVENTIL) (2.5 MG/3ML) 0.083% nebulizer solution  2. carvedilol (COREG) 3.125 MG tablet  3. pantoprazole (PROTONIX) 40 MG tablet  4.tiotropium (SPIRIVA HANDIHALER) 18 MCG inhalation capsule  5.venlafaxine XR (EFFEXOR-XR) 75 MG 24 hr capsule  6.lyrica  ____ Fleet Enema (as directed)   ____ Use CHG Soap as directed  ____ Use inhalers on the day of  surgery  __x__ Stop metformin 2 days prior to surgery    ____ Take 1/2 of usual insulin dose the night before surgery and none on the morning of surgery.   _x___ Stop Coumadin/Plavix/aspirin on Stopped Plavix 7 days ago  ____ Stop Anti-inflammatories on uses Tylenol   ____ Stop supplements until after surgery.    ____ Bring C-Pap to the hospital.

## 2015-02-18 NOTE — Pre-Procedure Instructions (Signed)
Patient states would refuse blood transfusion.  Called and informed Shawn and called SDS manager, Mahala Menghini.  Will wait til day of surgery and discuss possible need for blood transfusion with her power of attorney, her nephew.

## 2015-02-18 NOTE — Pre-Procedure Instructions (Signed)
E-mail sent to Dr Genevive Bi regarding refusal of blood transfusion and refusal for 2nd stick to obtain ABG.

## 2015-02-23 ENCOUNTER — Telehealth: Payer: Self-pay

## 2015-02-23 NOTE — Telephone Encounter (Signed)
Obtained all labs, EKG, and chest x-ray and had them scanned into the chart under media. Spoke with Nira Conn in Pre-admit at this time to ask where documentation of cardiac notes were; She explained that Anesthesia did not need cardiac clearance for this patient since EKG was normal.  Spoke with PGFQMKJ at [facility] who states that patient has not been seen from cardiology since 2012 according to their records. Patient has stopped her Plavix for the last 7 days per instructions.  Reviewed all with Dr. Genevive Bi over the phone. Everything that he requires has been completed and he wishes to proceed with surgery as planned tomorrow.

## 2015-02-24 ENCOUNTER — Encounter
Admission: RE | Disposition: A | Discharge status: Skilled Nursing Facility | Payer: Self-pay | Source: Ambulatory Visit | Attending: Cardiothoracic Surgery

## 2015-02-24 ENCOUNTER — Inpatient Hospital Stay: Payer: Medicare (Managed Care) | Admitting: Certified Registered Nurse Anesthetist

## 2015-02-24 ENCOUNTER — Encounter: Payer: Self-pay | Admitting: *Deleted

## 2015-02-24 ENCOUNTER — Inpatient Hospital Stay: Payer: Medicare (Managed Care)

## 2015-02-24 ENCOUNTER — Inpatient Hospital Stay
Admission: RE | Admit: 2015-02-24 | Discharge: 2015-03-06 | DRG: 166 | Disposition: A | Discharge status: Skilled Nursing Facility | Payer: Medicare (Managed Care) | Source: Ambulatory Visit | Attending: Cardiothoracic Surgery | Admitting: Cardiothoracic Surgery

## 2015-02-24 DIAGNOSIS — Z87891 Personal history of nicotine dependence: Secondary | ICD-10-CM

## 2015-02-24 DIAGNOSIS — J942 Hemothorax: Secondary | ICD-10-CM | POA: Diagnosis present

## 2015-02-24 DIAGNOSIS — F329 Major depressive disorder, single episode, unspecified: Secondary | ICD-10-CM | POA: Diagnosis present

## 2015-02-24 DIAGNOSIS — Z89612 Acquired absence of left leg above knee: Secondary | ICD-10-CM | POA: Diagnosis not present

## 2015-02-24 DIAGNOSIS — Z7951 Long term (current) use of inhaled steroids: Secondary | ICD-10-CM | POA: Diagnosis not present

## 2015-02-24 DIAGNOSIS — Z89512 Acquired absence of left leg below knee: Secondary | ICD-10-CM | POA: Diagnosis not present

## 2015-02-24 DIAGNOSIS — R32 Unspecified urinary incontinence: Secondary | ICD-10-CM | POA: Diagnosis present

## 2015-02-24 DIAGNOSIS — E785 Hyperlipidemia, unspecified: Secondary | ICD-10-CM | POA: Diagnosis present

## 2015-02-24 DIAGNOSIS — E119 Type 2 diabetes mellitus without complications: Secondary | ICD-10-CM | POA: Diagnosis present

## 2015-02-24 DIAGNOSIS — F419 Anxiety disorder, unspecified: Secondary | ICD-10-CM | POA: Diagnosis present

## 2015-02-24 DIAGNOSIS — J849 Interstitial pulmonary disease, unspecified: Secondary | ICD-10-CM | POA: Diagnosis present

## 2015-02-24 DIAGNOSIS — R042 Hemoptysis: Secondary | ICD-10-CM | POA: Diagnosis not present

## 2015-02-24 DIAGNOSIS — K219 Gastro-esophageal reflux disease without esophagitis: Secondary | ICD-10-CM | POA: Diagnosis present

## 2015-02-24 DIAGNOSIS — Z79899 Other long term (current) drug therapy: Secondary | ICD-10-CM

## 2015-02-24 DIAGNOSIS — G894 Chronic pain syndrome: Secondary | ICD-10-CM | POA: Diagnosis present

## 2015-02-24 DIAGNOSIS — C3432 Malignant neoplasm of lower lobe, left bronchus or lung: Secondary | ICD-10-CM | POA: Diagnosis present

## 2015-02-24 DIAGNOSIS — Z7984 Long term (current) use of oral hypoglycemic drugs: Secondary | ICD-10-CM

## 2015-02-24 DIAGNOSIS — Z7401 Bed confinement status: Secondary | ICD-10-CM

## 2015-02-24 DIAGNOSIS — Z881 Allergy status to other antibiotic agents status: Secondary | ICD-10-CM

## 2015-02-24 DIAGNOSIS — R918 Other nonspecific abnormal finding of lung field: Secondary | ICD-10-CM

## 2015-02-24 DIAGNOSIS — I69354 Hemiplegia and hemiparesis following cerebral infarction affecting left non-dominant side: Secondary | ICD-10-CM | POA: Diagnosis not present

## 2015-02-24 DIAGNOSIS — J449 Chronic obstructive pulmonary disease, unspecified: Secondary | ICD-10-CM | POA: Diagnosis present

## 2015-02-24 DIAGNOSIS — I10 Essential (primary) hypertension: Secondary | ICD-10-CM | POA: Diagnosis present

## 2015-02-24 DIAGNOSIS — Z880 Allergy status to penicillin: Secondary | ICD-10-CM

## 2015-02-24 DIAGNOSIS — C3412 Malignant neoplasm of upper lobe, left bronchus or lung: Secondary | ICD-10-CM | POA: Diagnosis not present

## 2015-02-24 DIAGNOSIS — J9621 Acute and chronic respiratory failure with hypoxia: Secondary | ICD-10-CM | POA: Diagnosis present

## 2015-02-24 DIAGNOSIS — J9811 Atelectasis: Secondary | ICD-10-CM | POA: Diagnosis present

## 2015-02-24 DIAGNOSIS — G629 Polyneuropathy, unspecified: Secondary | ICD-10-CM | POA: Diagnosis present

## 2015-02-24 DIAGNOSIS — Z01818 Encounter for other preprocedural examination: Secondary | ICD-10-CM

## 2015-02-24 DIAGNOSIS — R5381 Other malaise: Secondary | ICD-10-CM | POA: Diagnosis not present

## 2015-02-24 DIAGNOSIS — R0602 Shortness of breath: Secondary | ICD-10-CM | POA: Diagnosis not present

## 2015-02-24 DIAGNOSIS — Z66 Do not resuscitate: Secondary | ICD-10-CM | POA: Diagnosis present

## 2015-02-24 DIAGNOSIS — I340 Nonrheumatic mitral (valve) insufficiency: Secondary | ICD-10-CM | POA: Diagnosis present

## 2015-02-24 DIAGNOSIS — Z515 Encounter for palliative care: Secondary | ICD-10-CM | POA: Diagnosis present

## 2015-02-24 DIAGNOSIS — C3492 Malignant neoplasm of unspecified part of left bronchus or lung: Secondary | ICD-10-CM | POA: Diagnosis not present

## 2015-02-24 DIAGNOSIS — Z09 Encounter for follow-up examination after completed treatment for conditions other than malignant neoplasm: Secondary | ICD-10-CM

## 2015-02-24 DIAGNOSIS — R0902 Hypoxemia: Secondary | ICD-10-CM

## 2015-02-24 HISTORY — PX: VIDEO ASSISTED THORACOSCOPY: SHX5073

## 2015-02-24 LAB — BLOOD GAS, ARTERIAL
ACID-BASE DEFICIT: 3.1 mmol/L — AB (ref 0.0–2.0)
Allens test (pass/fail): POSITIVE — AB
Bicarbonate: 20.2 mEq/L — ABNORMAL LOW (ref 21.0–28.0)
FIO2: 0.28
O2 Saturation: 88.1 %
PCO2 ART: 29 mmHg — AB (ref 32.0–48.0)
PH ART: 7.45 (ref 7.350–7.450)
Patient temperature: 37
pO2, Arterial: 52 mmHg — ABNORMAL LOW (ref 83.0–108.0)

## 2015-02-24 LAB — GLUCOSE, CAPILLARY
GLUCOSE-CAPILLARY: 108 mg/dL — AB (ref 65–99)
GLUCOSE-CAPILLARY: 156 mg/dL — AB (ref 65–99)

## 2015-02-24 LAB — URINE DRUG SCREEN, QUALITATIVE (ARMC ONLY)
AMPHETAMINES, UR SCREEN: NOT DETECTED
BARBITURATES, UR SCREEN: NOT DETECTED
Benzodiazepine, Ur Scrn: NOT DETECTED
COCAINE METABOLITE, UR ~~LOC~~: NOT DETECTED
Cannabinoid 50 Ng, Ur ~~LOC~~: NOT DETECTED
MDMA (ECSTASY) UR SCREEN: NOT DETECTED
METHADONE SCREEN, URINE: NOT DETECTED
OPIATE, UR SCREEN: NOT DETECTED
Phencyclidine (PCP) Ur S: NOT DETECTED
Tricyclic, Ur Screen: NOT DETECTED

## 2015-02-24 LAB — POCT I-STAT 4, (NA,K, GLUC, HGB,HCT)
GLUCOSE: 102 mg/dL — AB (ref 65–99)
HEMATOCRIT: 34 % — AB (ref 36.0–46.0)
HEMOGLOBIN: 11.6 g/dL — AB (ref 12.0–15.0)
Potassium: 3.6 mmol/L (ref 3.5–5.1)
Sodium: 143 mmol/L (ref 135–145)

## 2015-02-24 SURGERY — VIDEO ASSISTED THORACOSCOPY
Anesthesia: General | Laterality: Left | Wound class: Clean Contaminated

## 2015-02-24 MED ORDER — PROPOFOL 10 MG/ML IV BOLUS
INTRAVENOUS | Status: DC | PRN
  Administered 2015-02-24: 110 mg via INTRAVENOUS

## 2015-02-24 MED ORDER — ROCURONIUM BROMIDE 100 MG/10ML IV SOLN
INTRAVENOUS | Status: DC | PRN
  Administered 2015-02-24: 20 mg via INTRAVENOUS
  Administered 2015-02-24: 50 mg via INTRAVENOUS

## 2015-02-24 MED ORDER — CARVEDILOL 3.125 MG PO TABS
3.1250 mg | ORAL_TABLET | Freq: Two times a day (BID) | ORAL | Status: DC
  Administered 2015-02-24 – 2015-03-06 (×16): 3.125 mg via ORAL
  Filled 2015-02-24 (×21): qty 1

## 2015-02-24 MED ORDER — VENLAFAXINE HCL ER 75 MG PO CP24
75.0000 mg | ORAL_CAPSULE | Freq: Every day | ORAL | Status: DC
  Administered 2015-02-25 – 2015-03-04 (×7): 75 mg via ORAL
  Filled 2015-02-24 (×9): qty 1

## 2015-02-24 MED ORDER — DEXAMETHASONE SODIUM PHOSPHATE 10 MG/ML IJ SOLN
INTRAMUSCULAR | Status: DC | PRN
  Administered 2015-02-24: 10 mg via INTRAVENOUS

## 2015-02-24 MED ORDER — LACTATED RINGERS IV SOLN
INTRAVENOUS | Status: DC | PRN
  Administered 2015-02-24: 11:00:00 via INTRAVENOUS

## 2015-02-24 MED ORDER — MORPHINE SULFATE (PF) 2 MG/ML IV SOLN
1.0000 mg | INTRAVENOUS | Status: DC | PRN
  Administered 2015-02-24 – 2015-02-26 (×13): 2 mg via INTRAVENOUS
  Filled 2015-02-24 (×15): qty 1

## 2015-02-24 MED ORDER — ALBUTEROL SULFATE (2.5 MG/3ML) 0.083% IN NEBU
2.5000 mg | INHALATION_SOLUTION | Freq: Four times a day (QID) | RESPIRATORY_TRACT | Status: DC
  Administered 2015-02-25 (×2): 2.5 mg via RESPIRATORY_TRACT
  Filled 2015-02-24 (×2): qty 3

## 2015-02-24 MED ORDER — TRAZODONE HCL 100 MG PO TABS
100.0000 mg | ORAL_TABLET | Freq: Every day | ORAL | Status: DC
  Administered 2015-02-24 – 2015-03-05 (×10): 100 mg via ORAL
  Filled 2015-02-24 (×10): qty 1

## 2015-02-24 MED ORDER — SUCCINYLCHOLINE CHLORIDE 20 MG/ML IJ SOLN
INTRAMUSCULAR | Status: DC | PRN
  Administered 2015-02-24: 100 mg via INTRAVENOUS

## 2015-02-24 MED ORDER — ALBUTEROL SULFATE (2.5 MG/3ML) 0.083% IN NEBU
2.5000 mg | INHALATION_SOLUTION | RESPIRATORY_TRACT | Status: DC | PRN
  Administered 2015-02-25: 2.5 mg via RESPIRATORY_TRACT
  Filled 2015-02-24: qty 3

## 2015-02-24 MED ORDER — ALBUTEROL SULFATE (2.5 MG/3ML) 0.083% IN NEBU
2.5000 mg | INHALATION_SOLUTION | RESPIRATORY_TRACT | Status: DC
  Administered 2015-02-24: 2.5 mg via RESPIRATORY_TRACT
  Filled 2015-02-24: qty 3

## 2015-02-24 MED ORDER — IPRATROPIUM-ALBUTEROL 0.5-2.5 (3) MG/3ML IN SOLN
RESPIRATORY_TRACT | Status: AC
  Administered 2015-02-24: 3 mL
  Filled 2015-02-24: qty 3

## 2015-02-24 MED ORDER — VANCOMYCIN HCL IN DEXTROSE 1-5 GM/200ML-% IV SOLN
INTRAVENOUS | Status: AC
  Filled 2015-02-24: qty 200

## 2015-02-24 MED ORDER — SODIUM CHLORIDE 0.9 % IJ SOLN
INTRAMUSCULAR | Status: AC
  Filled 2015-02-24: qty 50

## 2015-02-24 MED ORDER — PANTOPRAZOLE SODIUM 40 MG PO TBEC
40.0000 mg | DELAYED_RELEASE_TABLET | ORAL | Status: DC
  Administered 2015-02-26 – 2015-03-06 (×9): 40 mg via ORAL
  Filled 2015-02-24 (×7): qty 1

## 2015-02-24 MED ORDER — LORAZEPAM 0.5 MG PO TABS
0.5000 mg | ORAL_TABLET | Freq: Every day | ORAL | Status: DC
  Administered 2015-02-24: 0.5 mg via ORAL
  Filled 2015-02-24: qty 1

## 2015-02-24 MED ORDER — BUPIVACAINE LIPOSOME 1.3 % IJ SUSP
INTRAMUSCULAR | Status: AC
  Filled 2015-02-24: qty 20

## 2015-02-24 MED ORDER — VANCOMYCIN HCL 500 MG IV SOLR
500.0000 mg | Freq: Two times a day (BID) | INTRAVENOUS | Status: AC
  Administered 2015-02-24 – 2015-02-25 (×2): 500 mg via INTRAVENOUS
  Filled 2015-02-24 (×3): qty 500

## 2015-02-24 MED ORDER — FENTANYL CITRATE (PF) 100 MCG/2ML IJ SOLN
25.0000 ug | INTRAMUSCULAR | Status: DC | PRN
  Administered 2015-02-24 (×4): 25 ug via INTRAVENOUS

## 2015-02-24 MED ORDER — FENTANYL CITRATE (PF) 100 MCG/2ML IJ SOLN
INTRAMUSCULAR | Status: DC | PRN
  Administered 2015-02-24: 150 ug via INTRAVENOUS
  Administered 2015-02-24: 100 ug via INTRAVENOUS

## 2015-02-24 MED ORDER — VANCOMYCIN HCL IN DEXTROSE 1-5 GM/200ML-% IV SOLN
1000.0000 mg | INTRAVENOUS | Status: AC
  Administered 2015-02-24: 1000 mg via INTRAVENOUS

## 2015-02-24 MED ORDER — PHENYLEPHRINE HCL 10 MG/ML IJ SOLN
INTRAMUSCULAR | Status: DC | PRN
  Administered 2015-02-24: 100 ug via INTRAVENOUS
  Administered 2015-02-24: 150 ug via INTRAVENOUS
  Administered 2015-02-24 (×2): 100 ug via INTRAVENOUS

## 2015-02-24 MED ORDER — BUPIVACAINE HCL (PF) 0.25 % IJ SOLN
INTRAMUSCULAR | Status: DC | PRN
  Administered 2015-02-24: 30 mL

## 2015-02-24 MED ORDER — PREGABALIN 25 MG PO CAPS
50.0000 mg | ORAL_CAPSULE | Freq: Two times a day (BID) | ORAL | Status: DC
  Administered 2015-02-24 – 2015-02-25 (×2): 50 mg via ORAL
  Filled 2015-02-24 (×2): qty 2

## 2015-02-24 MED ORDER — ONDANSETRON HCL 4 MG/2ML IJ SOLN
INTRAMUSCULAR | Status: DC | PRN
  Administered 2015-02-24: 4 mg via INTRAVENOUS

## 2015-02-24 MED ORDER — BISACODYL 5 MG PO TBEC
10.0000 mg | DELAYED_RELEASE_TABLET | Freq: Every day | ORAL | Status: DC
  Administered 2015-02-25 – 2015-03-06 (×9): 10 mg via ORAL
  Filled 2015-02-24 (×8): qty 2

## 2015-02-24 MED ORDER — SODIUM CHLORIDE 0.9 % IV SOLN
INTRAVENOUS | Status: DC
Start: 2015-02-24 — End: 2015-02-24
  Administered 2015-02-24: 10:00:00 via INTRAVENOUS

## 2015-02-24 MED ORDER — NITROGLYCERIN 0.4 MG SL SUBL: 0.4000 mg | SUBLINGUAL_TABLET | SUBLINGUAL | Status: DC | PRN

## 2015-02-24 MED ORDER — INSULIN ASPART 100 UNIT/ML ~~LOC~~ SOLN
0.0000 [IU] | Freq: Three times a day (TID) | SUBCUTANEOUS | Status: DC
  Administered 2015-02-24: 2 [IU] via SUBCUTANEOUS
  Administered 2015-02-25 – 2015-02-27 (×8): 1 [IU] via SUBCUTANEOUS
  Administered 2015-02-28 – 2015-03-04 (×4): 2 [IU] via SUBCUTANEOUS
  Administered 2015-03-04: 1 [IU] via SUBCUTANEOUS
  Administered 2015-03-05: 2 [IU] via SUBCUTANEOUS
  Administered 2015-03-06: 1 [IU] via SUBCUTANEOUS
  Filled 2015-02-24 (×2): qty 1
  Filled 2015-02-24 (×2): qty 2
  Filled 2015-02-24 (×2): qty 1
  Filled 2015-02-24 (×2): qty 2
  Filled 2015-02-24 (×6): qty 1
  Filled 2015-02-24 (×2): qty 2

## 2015-02-24 MED ORDER — SUGAMMADEX SODIUM 200 MG/2ML IV SOLN
INTRAVENOUS | Status: DC | PRN
  Administered 2015-02-24: 147 mg via INTRAVENOUS

## 2015-02-24 MED ORDER — OXYBUTYNIN CHLORIDE ER 5 MG PO TB24
5.0000 mg | ORAL_TABLET | Freq: Every day | ORAL | Status: DC
  Administered 2015-02-24 – 2015-03-03 (×8): 5 mg via ORAL
  Filled 2015-02-24 (×10): qty 1

## 2015-02-24 MED ORDER — DEXTROSE-NACL 5-0.45 % IV SOLN: INTRAVENOUS | Status: DC

## 2015-02-24 MED ORDER — HYDROCODONE-ACETAMINOPHEN 5-325 MG PO TABS
1.0000 | ORAL_TABLET | ORAL | Status: DC | PRN
  Administered 2015-02-27 – 2015-02-28 (×3): 1 via ORAL
  Administered 2015-02-28: 2 via ORAL
  Administered 2015-03-01 (×2): 1 via ORAL
  Administered 2015-03-01 – 2015-03-02 (×4): 2 via ORAL
  Administered 2015-03-04: 1 via ORAL
  Filled 2015-02-24 (×2): qty 2
  Filled 2015-02-24: qty 1
  Filled 2015-02-24: qty 2
  Filled 2015-02-24 (×3): qty 1
  Filled 2015-02-24: qty 2
  Filled 2015-02-24 (×2): qty 1
  Filled 2015-02-24: qty 2

## 2015-02-24 MED ORDER — BUPIVACAINE HCL (PF) 0.25 % IJ SOLN
INTRAMUSCULAR | Status: AC
  Filled 2015-02-24: qty 30

## 2015-02-24 MED ORDER — ATORVASTATIN CALCIUM 20 MG PO TABS
40.0000 mg | ORAL_TABLET | Freq: Every day | ORAL | Status: DC
  Administered 2015-02-24 – 2015-03-03 (×8): 40 mg via ORAL
  Filled 2015-02-24 (×8): qty 2

## 2015-02-24 MED ORDER — ONDANSETRON HCL 4 MG/2ML IJ SOLN: 4.0000 mg | Freq: Once | INTRAMUSCULAR | Status: DC | PRN

## 2015-02-24 MED ORDER — ONDANSETRON HCL 4 MG/2ML IJ SOLN
4.0000 mg | Freq: Four times a day (QID) | INTRAMUSCULAR | Status: DC | PRN
  Administered 2015-02-25 – 2015-02-27 (×2): 4 mg via INTRAVENOUS
  Filled 2015-02-24 (×2): qty 2

## 2015-02-24 MED ORDER — MINERAL OIL LIGHT 100 % EX OIL
TOPICAL_OIL | CUTANEOUS | Status: AC
  Filled 2015-02-24: qty 25

## 2015-02-24 SURGICAL SUPPLY — 72 items
BENZOIN TINCTURE PRP APPL 2/3 (GAUZE/BANDAGES/DRESSINGS) ×2 IMPLANT
BNDG COHESIVE 4X5 TAN STRL (GAUZE/BANDAGES/DRESSINGS) ×2 IMPLANT
BRONCHOSCOPE PED SLIM DISP (MISCELLANEOUS) ×2 IMPLANT
CANISTER SUCT 1200ML W/VALVE (MISCELLANEOUS) ×2 IMPLANT
CATH FOL LEG HOLDER (MISCELLANEOUS) ×2 IMPLANT
CATH THOR STR 28F  SOFT WA (CATHETERS) ×1
CATH THOR STR 28F SOFT WA (CATHETERS) ×1 IMPLANT
CATH TRAY 16F METER LATEX (MISCELLANEOUS) ×2 IMPLANT
CATH URET ROBINSON 16FR STRL (CATHETERS) IMPLANT
CHLORAPREP W/TINT 26ML (MISCELLANEOUS) ×4 IMPLANT
CNTNR SPEC 2.5X3XGRAD LEK (MISCELLANEOUS) ×2
CONT SPEC 4OZ STER OR WHT (MISCELLANEOUS) ×2
CONTAINER SPEC 2.5X3XGRAD LEK (MISCELLANEOUS) ×2 IMPLANT
CUTTER ECHEON FLEX ENDO 45 340 (ENDOMECHANICALS) ×4 IMPLANT
DEFOGGER SCOPE WARMER CLEARIFY (MISCELLANEOUS) ×2 IMPLANT
DRAIN CHANNEL 28F RND 3/8 FF (WOUND CARE) ×2 IMPLANT
DRAIN CHEST DRY SUCT SGL (MISCELLANEOUS) ×2 IMPLANT
DRAIN CONNECTOR BLAKE 3:1 (MISCELLANEOUS) ×2 IMPLANT
DRAPE C-SECTION (MISCELLANEOUS) ×2 IMPLANT
DRAPE MAG INST 16X20 L/F (DRAPES) ×2 IMPLANT
DRSG OPSITE POSTOP 3X4 (GAUZE/BANDAGES/DRESSINGS) ×12 IMPLANT
DRSG OPSITE POSTOP 4X6 (GAUZE/BANDAGES/DRESSINGS) IMPLANT
DRSG OPSITE POSTOP 4X8 (GAUZE/BANDAGES/DRESSINGS) IMPLANT
DRSG TELFA 3X8 NADH (GAUZE/BANDAGES/DRESSINGS) ×2 IMPLANT
ELECT BLADE 6 FLAT ULTRCLN (ELECTRODE) ×2 IMPLANT
ELECT BLADE 6.5 EXT (BLADE) ×2 IMPLANT
ELECT CAUTERY BLADE TIP 2.5 (TIP) ×2
ELECT REM PT RETURN 9FT ADLT (ELECTROSURGICAL) ×2
ELECTRODE CAUTERY BLDE TIP 2.5 (TIP) ×1 IMPLANT
ELECTRODE REM PT RTRN 9FT ADLT (ELECTROSURGICAL) ×1 IMPLANT
GAUZE SPONGE 4X4 12PLY STRL (GAUZE/BANDAGES/DRESSINGS) ×2 IMPLANT
GLOVE EXAM LX STRL 7.5 (GLOVE) ×16 IMPLANT
GOWN STRL REUS W/ TWL LRG LVL3 (GOWN DISPOSABLE) ×5 IMPLANT
GOWN STRL REUS W/TWL LRG LVL3 (GOWN DISPOSABLE) ×5
KIT RM TURNOVER STRD PROC AR (KITS) ×2 IMPLANT
LABEL OR SOLS (LABEL) ×2 IMPLANT
LOOP RED MAXI  1X406MM (MISCELLANEOUS)
LOOP VESSEL MAXI 1X406 RED (MISCELLANEOUS) IMPLANT
MARKER SKIN DUAL TIP RULER LAB (MISCELLANEOUS) ×2 IMPLANT
NEEDLE HYPO 22GX1.5 SAFETY (NEEDLE) ×2 IMPLANT
NEEDLE SPNL 20GX3.5 QUINCKE YW (NEEDLE) ×2 IMPLANT
NS IRRIG 500ML POUR BTL (IV SOLUTION) IMPLANT
PACK BASIN MAJOR ARMC (MISCELLANEOUS) ×2 IMPLANT
RELOAD GOLD ECHELON 45 (STAPLE) ×6 IMPLANT
RELOAD STAPLER LINE PROX 30 GR (STAPLE) IMPLANT
SCISSORS METZENBAUM CVD 33 (INSTRUMENTS) IMPLANT
SPONGE KITTNER 5P (MISCELLANEOUS) ×2 IMPLANT
STAPLER RELOAD LINE PROX 30 GR (STAPLE)
STAPLER SKIN PROX 35W (STAPLE) IMPLANT
STAPLER VASCULAR ECHELON 35 (CUTTER) IMPLANT
STRIP CLOSURE SKIN 1/2X4 (GAUZE/BANDAGES/DRESSINGS) ×2 IMPLANT
SUT ETHILON 4-0 (SUTURE) ×3
SUT ETHILON 4-0 FS2 18XMFL BLK (SUTURE) ×3
SUT MNCRL 3-0 (SUTURE) IMPLANT
SUT SILK 0 (SUTURE)
SUT SILK 0 30XBRD TIE 6 (SUTURE) IMPLANT
SUT SILK 1 SH (SUTURE) ×14 IMPLANT
SUT VIC AB 0 CT1 36 (SUTURE) ×4 IMPLANT
SUT VIC AB 2-0 CT1 27 (SUTURE) ×3
SUT VIC AB 2-0 CT1 TAPERPNT 27 (SUTURE) ×3 IMPLANT
SUT VIC AB 2-0 CT2 27 (SUTURE) ×2 IMPLANT
SUT VICRYL 2 TP 1 (SUTURE) IMPLANT
SUTURE ETHLN 4-0 FS2 18XMF BLK (SUTURE) ×3 IMPLANT
SYR 10ML SLIP (SYRINGE) IMPLANT
SYR BULB IRRIG 60ML STRL (SYRINGE) ×2 IMPLANT
TAPE ADH 3 LX (MISCELLANEOUS) ×2 IMPLANT
TAPE TRANSPORE STRL 2 31045 (GAUZE/BANDAGES/DRESSINGS) IMPLANT
TROCAR FLEXIPATH 20X80 (ENDOMECHANICALS) ×6 IMPLANT
TROCAR FLEXIPATH THORACIC 15MM (ENDOMECHANICALS) IMPLANT
TUBING CONNECTING 10 (TUBING) ×2 IMPLANT
WATER STERILE IRR 1000ML POUR (IV SOLUTION) ×2 IMPLANT
YANKAUER SUCT BULB TIP FLEX NO (MISCELLANEOUS) ×2 IMPLANT

## 2015-02-24 NOTE — Progress Notes (Signed)
Date: 02/24/2015,   MRN# [id number] Emily Kidd 12-02-1950 Code Status:     Code Status Orders        Start     Ordered   02/24/15 1530  Full code   Continuous     02/24/15 1529    Code Status History    Date Active Date Inactive Code Status Order ID Comments User Context   01/04/2015  8:57 AM 01/06/2015  8:46 PM Full Code [id number]  Harrie Foreman, MD Inpatient   12/15/2014  7:01 PM 12/16/2014  4:16 PM Full Code [id number]  Demetrios Loll, MD Inpatient   11/16/2014  5:48 AM 11/21/2014  4:42 PM Full Code [id number]  Verlin Dike, RN Inpatient   11/16/2014  4:31 AM 11/16/2014  5:47 AM DNR [id number]  Quintella Baton, MD Inpatient    Advance Directive Documentation        Most Recent Value   Type of Advance Directive  Healthcare Power of Attorney, Living will   Pre-existing out of facility DNR order (yellow form or pink MOST form)     "MOST" Form in Place?       Hosp day:'@LENGTHOFSTAYDAYS'$ @ Referring MD: '@ATDPROV'$ @        AdmissionWeight: 162 lb (73.483 kg)                 CurrentWeight: 162 lb (73.483 kg)  CC: post op left hemithorax opacification  HPI: This is a 65 year old lady who has had persistent non massive hemoptysis, left infiltrate, mediastinal nodes. S/p - ve EBUS (Dr. Rich Number). Hence thoroscopy was  Done. Here in the ICU. Intermittent;y confused. On NRBM. Left hemothorax is opacified. Left chest tube in place, no significant drainage. See kc notes  PMHX:   Past Medical History  Diagnosis Date  . Hypertension   . COPD (chronic obstructive pulmonary disease) ([place])   . Pneumonia   . Sepsis ([place])   . Lung mass   . Diabetes mellitus without complication ([place])   . Dysrhythmia     TACHYCARDIA  . GERD (gastroesophageal reflux disease)   . Mitral regurgitation   . Carotid artery stenosis   . Muscle spasms of neck   . TIA (transient ischemic attack)   . Anxiety   . Hyperlipidemia   . Major depressive disorder ([place])   . Chronic pain syndrome   . Drug abuse in remission    . Vertigo   . Stable angina ([facility])   . Neuropathy ([place])   . Left-sided weakness     FROM TIA  . Coronary artery disease   . Shortness of breath dyspnea    Surgical Hx:  Past Surgical History  Procedure Laterality Date  . Endobronchial ultrasound N/A 11/24/2014    Procedure: ENDOBRONCHIAL ULTRASOUND;  Surgeon: Flora Lipps, MD;  Location: [facility] ORS;  Service: Cardiopulmonary;  Laterality: N/A;  . Above knee amputation Left   . Endobronchial ultrasound N/A 12/15/2014    Procedure: ENDOBRONCHIAL ULTRASOUND;  Surgeon: Flora Lipps, MD;  Location: [facility] ORS;  Service: Cardiopulmonary;  Laterality: N/A;   Family Hx:  Family History  Problem Relation Age of Onset  . Heart attack Father   . Stroke Father    Social Hx:   Social History  Substance Use Topics  . Smoking status: Former Smoker    Quit date: 09/30/2008  . Smokeless tobacco: None  . Alcohol Use: No   Medication:    Home Medication:  No current outpatient prescriptions on file.  Current Medication: '@CURMEDTAB'$ @  Allergies:  Azithromycin and Penicillins  Review of Systems: Gen:  Denies  fever, sweats, chills HEENT: Denies blurred vision, double vision, ear pain, eye pain, hearing loss, nose bleeds, sore throat Cvc:  No dizziness, chest pain or heaviness Resp: mils sob, chest discomfort   Gi: Denies swallowing difficulty, stomach pain, nausea or vomiting, diarrhea, constipation, bowel incontinence Gu:  Denies bladder incontinence, burning urine Ext:   No Joint pain, stiffness or swelling Skin: No skin rash, easy bruising or bleeding or hives Endoc:  No polyuria, polydipsia , polyphagia or weight change Psych: No depression, insomnia or hallucinations  Other:  All other systems negative  Physical Examination:   VS: BP 129/68 mmHg  Pulse 102  Temp(Src) 98 F (36.7 C) (Tympanic)  Resp 19  Ht '5\' 4"'$  (1.626 m)  Wt 162 lb (73.483 kg)  BMI 27.79 kg/m2  SpO2 94%  General Appearance: MILD distress, confused at times   Neuro/psych: without focal findings, mental status, speech normal, alert and oriented, cranial nerves 2-12 intact, reflexes normal and symmetric, sensation grossly normal  HEENT: PERRLA, EOM intact, no ptosis, no other lesions noticed NECK: Supple, jvd, no stridor, trachea midlined Pulmonary:.No wheezing, No rales  No use of accessory muscles:   Cardiovascular:  Normal S1,S2.  No m/r/g.  Abdominal aorta pulsation normal.    Abdomen:Benign, Soft, non-tender, No masses, hepatosplenomegaly, No lymphadenopathy Endoc: No evident thyromegaly, no signs of acromegaly or Cushing features Skin:   warm, no rashes, no ecchymosis  Extremities: normal, no cyanosis, clubbing, no edema, warm with normal capillary refill. Other findings:   Labs results:   Recent Labs     02/24/15  0947  HGB  11.6*  HCT  34.0*  GLUCOSE  102*  ,     Rad results:   Dg Chest Port 1 View  02/24/2015  CLINICAL DATA:  Postoperative evaluation. EXAM: PORTABLE CHEST 1 VIEW COMPARISON:  Chest radiograph 01/15/2015. FINDINGS: Left-sided chest tubes in place. There is complete opacification left hemi thorax. The cardiac and mediastinal contours are largely obscured. Heterogeneous opacities right mid and lower lung. Low lung volumes. No definite pneumothorax. Can't exclude left-sided pleural fluid. IMPRESSION: Complete opacification of the left hemi thorax is nonspecific and may be secondary to infection, edema and/or hemorrhage. Left chest tubes in place. Heterogeneous opacities right lung base may represent atelectasis, infection and/or aspiration. Electronically Signed   By: Lovey Newcomer M.D.   On: 02/24/2015 14:03    Assessment and Plan: S/p left thoracoscopy with biopsy, left chest tube in place, left hemothorax is opacified. ? Re expansion pulmonary edema, mucus plugs, infection, NRBM Post op care Pulmonary toilet Chest ct ordered ? bronchoscopy per above dvt prophylaxis Serial cxr Vanco/ pip for now Path pending  I  have personally obtained a history, examined the patient, evaluated laboratory and imaging results, formulated the assessment and plan and placed orders.  The Patient requires high complexity decision making for assessment and support, frequent evaluation and titration of therapies, application of advanced monitoring technologies and extensive interpretation of multiple databases.   Herbon Fleming,M.D. Pulmonary & Critical care Medicine [facility]

## 2015-02-24 NOTE — Brief Op Note (Signed)
  02/24/2015  1:18 PM  PATIENT:  Emily Kidd  65 y.o. female  PRE-OPERATIVE DIAGNOSIS:  Pulmonary hemorrhage  POST-OPERATIVE DIAGNOSIS:  Same  PROCEDURE:  Left thoracoscopy with biopsy of left upper and left lower lobes  SURGEON:  Surgeon(s) and Role:    * Nestor Lewandowsky, MD - Primary    * Lucretia Field, MD - Assisting  ASSISTANTS: Champ Mungo PAS  ANESTHESIA: Gen.  INDICATIONS FOR PROCEDURE this 65 year old woman has had a several week history of hemoptysis with a CT scan showing a diffuse interstitial process involving the left lung mostly the left upper lobe. She had been treated with multiple empiric antibiotics and steroids with failure to resolve her symptoms. She was apprised of the indications and risks of surgery and she gave her informed consent.  DICTATION: The patient brought to the operating suite and placed in the supine position. General endotracheal anesthesia was given with a double-lumen tube. Preoperative bronchoscopy was carried out and confirmed it to dependent proper position. Patient was then turned for a left thoracoscopy. All pressure points were carefully padded. The patient is prepped and draped in usual sterile fashion.  We began by making an incision anteriorly at approximately the fourth intercostal space. The incision was deepened down through the muscles of the chest wall to the pleural space was entered. Upon entering the pleural space a thoracoscope was introduced and 2 additional port sites were created in a triangular fashion on the lower aspect the left hemithorax. Through these 3 20 mm port sites were able to complete the procedure. Thoracoscopy was carried out and the upper and lower lobes were both abnormal with significant amounts of subpleural carbonaceous debris or blood as well as evidence of fibrosis and honeycombing. We then chosen a section of the upper lobe and the lower lobe inferiorly along the edges to biopsy. A generous portion of each was  sampled with the endoscopic stapler. Within the upper lobe the staple line was removed and cut into pieces and sent for appropriate microbiologic cultures. Other pieces were sent for permanent section. The lower lobe was sent for permanent section.  We then placed a 11 Blake through our most inferior port and brought out through separate stab wound. We placed a 28 straight chest tube to the apex of the lung and brought it out through a separate stab wound. All wounds were then closed in multiple layers using running absorbable sutures on the fascia and subcutaneous tissues. The skin was closed with nylon. The chest tubes were secured with silk.  The patient was then extubated and taken to the recovery room in stable condition. All sponge needle and ensuring counts were correct as reported to me at the end of the case.   Nestor Lewandowsky, MD

## 2015-02-24 NOTE — H&P (Signed)
[facility] HOSPITALIST  Medical Consultation  Emily Kidd MGQ:[id number] DOB: 17-Jan-1951 DOA: 02/24/2015 PCP: [place]   Requesting physician: Vicenta Dunning MD Date of consultation:  02/24/2015 Reason for consultation:  Medical management of DM,HTN, GERD     HISTORY OF PRESENT ILLNESS: Emily Kidd  is a 65 y.o. female with a known history of several week history hemoptysis with CT scan suggestive interstitial process involving the left lung mostly the left upper lobe. Patient been treated with multiple antibiotics and continued to have the symptoms therefore cardiothoracic surgery took her to the operating room and did a left arthroscopy with biopsy of the left upper and left lower lobes. Patient has a chest tube in place. She is complaining of some pain at the site of the chest tube. She is also on a full face mask. This as a chest pain she is not complaining of any shortness of breath. PAST MEDICAL HISTORY:   Past Medical History  Diagnosis Date  . Hypertension   . COPD (chronic obstructive pulmonary disease) ([place])   . Pneumonia   . Sepsis ([place])   . Lung mass   . Diabetes mellitus without complication ([place])   . Dysrhythmia     TACHYCARDIA  . GERD (gastroesophageal reflux disease)   . Mitral regurgitation   . Carotid artery stenosis   . Muscle spasms of neck   . TIA (transient ischemic attack)   . Anxiety   . Hyperlipidemia   . Major depressive disorder ([place])   . Chronic pain syndrome   . Drug abuse in remission   . Vertigo   . Stable angina ([facility])   . Neuropathy ([place])   . Left-sided weakness     FROM TIA  . Coronary artery disease   . Shortness of breath dyspnea     PAST SURGICAL HISTORY: Past Surgical History  Procedure Laterality Date  . Endobronchial ultrasound N/A 11/24/2014    Procedure: ENDOBRONCHIAL ULTRASOUND;  Surgeon: Flora Lipps, MD;  Location: [facility] ORS;  Service: Cardiopulmonary;  Laterality: N/A;  . Above knee amputation Left   . Endobronchial  ultrasound N/A 12/15/2014    Procedure: ENDOBRONCHIAL ULTRASOUND;  Surgeon: Flora Lipps, MD;  Location: [facility] ORS;  Service: Cardiopulmonary;  Laterality: N/A;    SOCIAL HISTORY:  Social History  Substance Use Topics  . Smoking status: Former Smoker    Quit date: 09/30/2008  . Smokeless tobacco: Not on file  . Alcohol Use: No    FAMILY HISTORY:  Family History  Problem Relation Age of Onset  . Heart attack Father   . Stroke Father     DRUG ALLERGIES:  Allergies  Allergen Reactions  . Azithromycin   . Penicillins Rash    REVIEW OF SYSTEMS:   CONSTITUTIONAL: No fever, fatigue or weakness.  EYES: No blurred or double vision.  EARS, NOSE, AND THROAT: No tinnitus or ear pain.  RESPIRATORY: No cough, shortness of breath, wheezing or hemoptysis.  CARDIOVASCULAR: Positive chest pain, orthopnea, edema.  GASTROINTESTINAL: No nausea, vomiting, diarrhea or abdominal pain.  GENITOURINARY: No dysuria, hematuria.  ENDOCRINE: No polyuria, nocturia,  HEMATOLOGY: No anemia, easy bruising or bleeding SKIN: No rash or lesion. MUSCULOSKELETAL: No joint pain or arthritis.   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   MEDICATIONS AT HOME:  Prior to Admission medications   Medication Sig Start Date End Date Taking? Authorizing Provider  acetaminophen (TYLENOL) 650 MG CR tablet Take 650 mg by mouth 3 (three) times  daily.   Yes Historical Provider, MD  albuterol (PROVENTIL) (2.5 MG/3ML) 0.083% nebulizer solution Take 2.5 mg by nebulization every 4 (four) hours as needed for shortness of breath.    Yes Historical Provider, MD  atorvastatin (LIPITOR) 40 MG tablet Take 40 mg by mouth at bedtime.   Yes Historical Provider, MD  benzocaine (HURRICAINE) 20 % GEL Use as directed 1 application in the mouth or throat 3 (three) times daily as needed (for pain).    Yes Historical Provider, MD  benzonatate (TESSALON) 100 MG capsule Take 100 mg by mouth 3  (three) times daily as needed for cough.   Yes Historical Provider, MD  camphor-menthol Timoteo Ace) lotion Apply 1 application topically 4 (four) times daily as needed for itching.   Yes Historical Provider, MD  Carboxymethylcellul-Glycerin (OPTIVE) 0.5-0.9 % SOLN Apply 1 drop to eye See admin instructions. 3 to 4 times daily as needed for dryness/irritation   Yes Historical Provider, MD  carvedilol (COREG) 3.125 MG tablet Take 3.125 mg by mouth 2 (two) times daily.    Yes Historical Provider, MD  Diaper Rash Products (DESITIN) OINT Apply 1 application topically every morning. To buttocks   Yes Historical Provider, MD  diclofenac sodium (VOLTAREN) 1 % GEL Apply 2 g topically every 8 (eight) hours as needed (for pain).    Yes Historical Provider, MD  docusate sodium (COLACE) 100 MG capsule Take 100 mg by mouth daily as needed for mild constipation.   Yes Historical Provider, MD  lidocaine (LIDODERM) 5 % Place 1 patch onto the skin at bedtime. Remove & Discard patch within 12 hours or as directed by MD   Yes Historical Provider, MD  loperamide (IMODIUM A-D) 2 MG capsule Take 2 mg by mouth as needed for diarrhea or loose stools (2 by mouth after 1st loose stool, then 1 following each subsequent loose stool).   Yes Historical Provider, MD  Melatonin 3 MG TABS Take 1 tablet by mouth at bedtime.   Yes Historical Provider, MD  metFORMIN (GLUCOPHAGE) 500 MG tablet Take 500 mg by mouth daily.   Yes Historical Provider, MD  miconazole (ZEASORB-AF) 2 % powder Apply 1 application topically every morning. To thoroughly dry groin after bath   Yes Historical Provider, MD  nitroGLYCERIN (NITROSTAT) 0.4 MG SL tablet Place 1 tablet (0.4 mg total) under the tongue every 5 (five) minutes as needed for chest pain. 01/06/15  Yes Aldean Jewett, MD  oxybutynin (DITROPAN-XL) 10 MG 24 hr tablet Take 5 mg by mouth at bedtime.    Yes Historical Provider, MD  pantoprazole (PROTONIX) 40 MG tablet Take 40 mg by mouth every morning.     Yes Historical Provider, MD  polyethylene glycol (MIRALAX / GLYCOLAX) packet Take 17 g by mouth every evening.    Yes Historical Provider, MD  pregabalin (LYRICA) 150 MG capsule Take 150 mg by mouth 2 (two) times daily. Along with lyrica 50 mg   Yes Historical Provider, MD  pregabalin (LYRICA) 50 MG capsule Take 50 mg by mouth 2 (two) times daily.   Yes Historical Provider, MD  senna (SENOKOT) 8.6 MG TABS tablet Take 1 tablet by mouth every evening.    Yes Historical Provider, MD  sodium chloride (OCEAN) 0.65 % SOLN nasal spray Place 1-2 sprays into both nostrils as needed for congestion.   Yes Historical Provider, MD  tiotropium (SPIRIVA HANDIHALER) 18 MCG inhalation capsule Place 18 mcg into inhaler and inhale daily.   Yes Historical Provider, MD  traZODone (DESYREL)  100 MG tablet Take 100 mg by mouth at bedtime.   Yes Historical Provider, MD  venlafaxine XR (EFFEXOR-XR) 75 MG 24 hr capsule Take 75 mg by mouth daily.   Yes Historical Provider, MD  Vitamin D, Ergocalciferol, (DRISDOL) 50000 UNITS CAPS capsule Take 50,000 Units by mouth every 30 (thirty) days. On the first Monday of each month   Yes Historical Provider, MD  alum & mag hydroxide-simeth (MAALOX/MYLANTA) 200-200-20 MG/5ML suspension Take 15 mLs by mouth every 6 (six) hours as needed for indigestion or heartburn.    Historical Provider, MD  calcium carbonate (TUMS - DOSED IN MG ELEMENTAL CALCIUM) 500 MG chewable tablet Chew 1 tablet by mouth every 4 (four) hours as needed for indigestion or heartburn.    Historical Provider, MD  carboxymethylcellulose (REFRESH PLUS) 0.5 % SOLN 1 drop 3 (three) times daily as needed.    Historical Provider, MD  clopidogrel (PLAVIX) 75 MG tablet Take 75 mg by mouth daily.    Historical Provider, MD  fluticasone (FLONASE) 50 MCG/ACT nasal spray Place 2 sprays into both nostrils daily.    Historical Provider, MD  liver oil-zinc oxide (DESITIN) 40 % ointment Apply 1 application topically as needed for  irritation.    Historical Provider, MD  LORazepam (ATIVAN) 0.5 MG tablet Take 0.5 mg by mouth at bedtime.    Historical Provider, MD  meclizine (ANTIVERT) 12.5 MG tablet Take 12.5 mg by mouth 2 (two) times daily.    Historical Provider, MD      PHYSICAL EXAMINATION:   VITAL SIGNS: Blood pressure 129/68, pulse 102, temperature 98 F (36.7 C), temperature source Tympanic, resp. rate 19, height '5\' 4"'$  (1.626 m), weight 73.483 kg (162 lb), SpO2 94 %.  GENERAL:  65 y.o.-year-old patient lying in the bed  uncomfortable due to pain EYES: Pupils equal, round, reactive to light and accommodation. No scleral icterus. Extraocular muscles intact.  HEENT: Head atraumatic, normocephalic. Oropharynx and nasopharynx clear.  NECK:  Supple, no jugular venous distention. No thyroid enlargement, no tenderness.  LUNGS:  Diminished breath sounds in the left upper lobe no wheezing or rhonchi  CARDIOVASCULAR: S1, S2 normal. No murmurs, rubs, or gallops.  ABDOMEN: Soft, nontender, nondistended. Bowel sounds present. No organomegaly or mass.  EXTREMITIES: No pedal edema, cyanosis, or clubbing.  NEUROLOGIC: Cranial nerves II through XII are intact. Muscle strength 5/5 in all extremities. Sensation intact. Gait not checked.  PSYCHIATRIC: The patient is alert and oriented x 3.  SKIN: No obvious rash, lesion, or ulcer.   LABORATORY PANEL:   CBC  Recent Labs Lab 02/24/15 0947  HGB 11.6*  HCT 34.0*   ------------------------------------------------------------------------------------------------------------------  Chemistries   Recent Labs Lab 02/24/15 0947  NA 143  K 3.6  GLUCOSE 102*   ------------------------------------------------------------------------------------------------------------------ CrCl cannot be calculated (Patient has no serum creatinine result on file.). ------------------------------------------------------------------------------------------------------------------ No results  for input(s): TSH, T4TOTAL, T3FREE, THYROIDAB in the last 72 hours.  Invalid input(s): FREET3   Coagulation profile No results for input(s): INR, PROTIME in the last 168 hours. ------------------------------------------------------------------------------------------------------------------- No results for input(s): DDIMER in the last 72 hours. -------------------------------------------------------------------------------------------------------------------  Cardiac Enzymes No results for input(s): CKMB, TROPONINI, MYOGLOBIN in the last 168 hours.  Invalid input(s): CK ------------------------------------------------------------------------------------------------------------------ Invalid input(s): POCBNP  ---------------------------------------------------------------------------------------------------------------  Urinalysis    Component Value Date/Time   COLORURINE YELLOW 10/13/2006 [place] 10/13/2006 0837   LABSPEC 1.031* 10/13/2006 0837   PHURINE 5.5 10/13/2006 [place] 10/13/2006 0837   HGBUR NEGATIVE 10/13/2006 6195  BILIRUBINUR NEGATIVE 10/13/2006 [employer] 10/13/2006 0837   PROTEINUR NEGATIVE 10/13/2006 0837   UROBILINOGEN 0.2 10/13/2006 0837   NITRITE NEGATIVE 10/13/2006 0837   LEUKOCYTESUR SMALL* 10/13/2006 0837     RADIOLOGY: Dg Chest Port 1 View  02/24/2015  CLINICAL DATA:  Postoperative evaluation. EXAM: PORTABLE CHEST 1 VIEW COMPARISON:  Chest radiograph 01/15/2015. FINDINGS: Left-sided chest tubes in place. There is complete opacification left hemi thorax. The cardiac and mediastinal contours are largely obscured. Heterogeneous opacities right mid and lower lung. Low lung volumes. No definite pneumothorax. Can't exclude left-sided pleural fluid. IMPRESSION: Complete opacification of the left hemi thorax is nonspecific and may be secondary to infection, edema and/or hemorrhage. Left chest tubes in place.  Heterogeneous opacities right lung base may represent atelectasis, infection and/or aspiration. Electronically Signed   By: Lovey Newcomer M.D.   On: 02/24/2015 14:03    EKG: Orders placed or performed in visit on 01/30/15  . EKG 12-Lead    IMPRESSION AND PLAN: Patient is 65 year old African-American lady status post arthroscopy with biopsy  1. Hypertension continue Coreg blood pressure currently stable  2. Diabetes type 2 I will place her on sliding scale insulin every 6 hours due to patient currently being nothing by mouth. Will also hold her oral therapy  3. Hyperlipidemia continue atorvastatin  4. Urinary incontinence continue oxybutynin  5. Previous history of CVA resume appropriate per his cardiothoracic surgery  6. History of hemoptysis status post surgery as above currently on high flow oxygen, maintaining her sats her primary pulmonologist has been consult did further recommendations per them continue current therapy with nebulizers and Spiriva as taking at home.  7. Miscellaneous SCD for DVT prophylaxis     All the records are reviewed and case discussed with ED provider. Management plans discussed with the patient, family and they are in agreement.  CODE STATUS: Full    Code Status Orders        Start     Ordered   02/24/15 1530  Full code   Continuous     02/24/15 1529    Code Status History    Date Active Date Inactive Code Status Order ID Comments User Context   01/04/2015  8:57 AM 01/06/2015  8:46 PM Full Code [id number]  Harrie Foreman, MD Inpatient   12/15/2014  7:01 PM 12/16/2014  4:16 PM Full Code [id number]  Demetrios Loll, MD Inpatient   11/16/2014  5:48 AM 11/21/2014  4:42 PM Full Code [id number]  Verlin Dike, RN Inpatient   11/16/2014  4:31 AM 11/16/2014  5:47 AM DNR [id number]  Quintella Baton, MD Inpatient    Advance Directive Documentation        Most Recent Value   Type of Advance Directive  Healthcare Power of Attorney, Living will    Pre-existing out of facility DNR order (yellow form or pink MOST form)     "MOST" Form in Place?         TOTAL TIME TAKING CARE OF THIS PATIENT: 55 minutes.    Dustin Flock M.D on 02/24/2015 at 4:56 PM  Between 7am to 6pm - Pager - [phone]  After 6pm go to [email or web address] - password [email or web address] [facility]  Office  [phone]  CC: Primary care physician; [place]

## 2015-02-24 NOTE — Anesthesia Procedure Notes (Signed)
Procedure Name: Intubation Date/Time: 02/24/2015 10:29 AM Performed by: Jonna Clark Pre-anesthesia Checklist: Patient identified, Patient being monitored, Timeout performed, Emergency Drugs available and Suction available Patient Re-evaluated:Patient Re-evaluated prior to inductionOxygen Delivery Method: Circle system utilized Preoxygenation: Pre-oxygenation with 100% oxygen Intubation Type: IV induction Ventilation: Mask ventilation without difficulty and Oral airway inserted - appropriate to patient size Laryngoscope Size: Mac and 3 Grade View: Grade I Tube type: Oral Endobronchial tube: Left, Double lumen EBT and EBT position confirmed by auscultation and 37 Fr Number of attempts: 1 Airway Equipment and Method: Stylet Placement Confirmation: ETT inserted through vocal cords under direct vision,  positive ETCO2 and breath sounds checked- equal and bilateral Secured at: 27 cm Tube secured with: Tape Dental Injury: Teeth and Oropharynx as per pre-operative assessment

## 2015-02-24 NOTE — Progress Notes (Signed)
I have reviewed the history and physical and there are no changes.  The patient understands the proposed procedure and all questions answered.  Nestor Lewandowsky, MD

## 2015-02-24 NOTE — Transfer of Care (Signed)
Immediate Anesthesia Transfer of Care Note  Patient: Emily Kidd  Procedure(s) Performed: Procedure(s): Preoperative bronchoscopy, left thoracoscopy and lung biopsy (Left)  Patient Location: PACU  Anesthesia Type:General  Level of Consciousness: patient cooperative and lethargic  Airway & Oxygen Therapy: Patient Spontanous Breathing and non-rebreather face mask  Post-op Assessment: Report given to RN and Post -op Vital signs reviewed and stable  Post vital signs: Reviewed and stable  Last Vitals:  Filed Vitals:   02/24/15 0909 02/24/15 1338  BP:  120/78  Pulse: 97   Temp:  37.3 C  Resp:  22    Complications: No apparent anesthesia complications

## 2015-02-24 NOTE — Anesthesia Preprocedure Evaluation (Signed)
Anesthesia Evaluation  Patient identified by MRN, date of birth, ID band Patient awake    Reviewed: Allergy & Precautions, H&P , NPO status , Patient's Chart, lab work & pertinent test results, reviewed documented beta blocker date and time   History of Anesthesia Complications Negative for: history of anesthetic complications  Airway Mallampati: II  TM Distance: >3 FB     Dental  (+) Edentulous Upper, Edentulous Lower, Poor Dentition   Pulmonary shortness of breath, with exertion and at rest, pneumonia, resolved, COPD, neg recent URI, former smoker,           Cardiovascular hypertension, Pt. on medications and Pt. on home beta blockers + angina + CAD and + Peripheral Vascular Disease  (-) Past MI and (-) Cardiac Stents + dysrhythmias + Valvular Problems/Murmurs MR      Neuro/Psych neg Seizures PSYCHIATRIC DISORDERS TIA Neuromuscular disease    GI/Hepatic GERD  ,  Endo/Other  diabetes, Type 2, Oral Hypoglycemic Agents  Renal/GU      Musculoskeletal   Abdominal   Peds  Hematology   Anesthesia Other Findings   Reproductive/Obstetrics                             Anesthesia Physical  Anesthesia Plan  ASA: IV  Anesthesia Plan: General   Post-op Pain Management:    Induction: Intravenous  Airway Management Planned: Oral ETT  Additional Equipment:   Intra-op Plan:   Post-operative Plan:   Informed Consent: I have reviewed the patients History and Physical, chart, labs and discussed the procedure including the risks, benefits and alternatives for the proposed anesthesia with the patient or authorized representative who has indicated his/her understanding and acceptance.   Dental Advisory Given  Plan Discussed with: CRNA, Anesthesiologist and Surgeon  Anesthesia Plan Comments:         Anesthesia Quick Evaluation

## 2015-02-24 NOTE — Progress Notes (Signed)
Chaplain rounded in the unit and offered a compassionate presence and support to the patient. Said prayer. Emily Kidd [phone]

## 2015-02-24 NOTE — Progress Notes (Signed)
Pt was ordered PO meds, swallow test was given before meds were given.  Pt tolerated ice chips and sips of water without incident.  Pt is alert and oriented.

## 2015-02-25 ENCOUNTER — Inpatient Hospital Stay: Payer: Medicare (Managed Care)

## 2015-02-25 LAB — GLUCOSE, CAPILLARY
GLUCOSE-CAPILLARY: 147 mg/dL — AB (ref 65–99)
Glucose-Capillary: 108 mg/dL — ABNORMAL HIGH (ref 65–99)
Glucose-Capillary: 119 mg/dL — ABNORMAL HIGH (ref 65–99)
Glucose-Capillary: 123 mg/dL — ABNORMAL HIGH (ref 65–99)
Glucose-Capillary: 158 mg/dL — ABNORMAL HIGH (ref 65–99)

## 2015-02-25 LAB — COMPREHENSIVE METABOLIC PANEL
ALBUMIN: 3.1 g/dL — AB (ref 3.5–5.0)
ALK PHOS: 101 U/L (ref 38–126)
ALT: 13 U/L — AB (ref 14–54)
AST: 17 U/L (ref 15–41)
Anion gap: 7 (ref 5–15)
CALCIUM: 8.4 mg/dL — AB (ref 8.9–10.3)
CO2: 28 mmol/L (ref 22–32)
CREATININE: 0.57 mg/dL (ref 0.44–1.00)
Chloride: 117 mmol/L — ABNORMAL HIGH (ref 101–111)
GFR calc Af Amer: >60 mL/min (ref >60)
GFR calc non Af Amer: >60 mL/min (ref >60)
GLUCOSE: 151 mg/dL — AB (ref 65–99)
Potassium: 4.1 mmol/L (ref 3.5–5.1)
SODIUM: 152 mmol/L — AB (ref 135–145)
Total Bilirubin: 0.8 mg/dL (ref 0.3–1.2)
Total Protein: 7 g/dL (ref 6.5–8.1)

## 2015-02-25 LAB — CBC
HCT: 37.5 % (ref 35.0–47.0)
HEMOGLOBIN: 11.8 g/dL — AB (ref 12.0–16.0)
MCH: 25.8 pg — AB (ref 26.0–34.0)
MCHC: 31.4 g/dL — ABNORMAL LOW (ref 32.0–36.0)
MCV: 81.9 fL (ref 80.0–100.0)
Platelets: 427 10*3/uL (ref 150–440)
RBC: 4.58 MIL/uL (ref 3.80–5.20)
RDW: 16.7 % — ABNORMAL HIGH (ref 11.5–14.5)
WBC: 19 10*3/uL — AB (ref 3.6–11.0)

## 2015-02-25 LAB — BLOOD GAS, ARTERIAL
ALLENS TEST (PASS/FAIL): POSITIVE — AB
Acid-Base Excess: 1.2 mmol/L (ref 0.0–3.0)
BICARBONATE: 26.6 meq/L (ref 21.0–28.0)
Delivery systems: POSITIVE
EXPIRATORY PAP: 5
FIO2: 0.85
INSPIRATORY PAP: 12
O2 Saturation: 91.1 %
Patient temperature: 37
pCO2 arterial: 44 mmHg (ref 32.0–48.0)
pH, Arterial: 7.39 (ref 7.350–7.450)
pO2, Arterial: 62 mmHg — ABNORMAL LOW (ref 83.0–108.0)

## 2015-02-25 MED ORDER — DOCUSATE SODIUM 100 MG PO CAPS
100.0000 mg | ORAL_CAPSULE | Freq: Two times a day (BID) | ORAL | Status: DC
  Administered 2015-02-25 – 2015-03-04 (×14): 100 mg via ORAL
  Filled 2015-02-25 (×15): qty 1

## 2015-02-25 MED ORDER — PREGABALIN 75 MG PO CAPS
200.0000 mg | ORAL_CAPSULE | Freq: Two times a day (BID) | ORAL | Status: DC
  Administered 2015-02-25 – 2015-03-06 (×18): 200 mg via ORAL
  Filled 2015-02-25: qty 1
  Filled 2015-02-25: qty 2
  Filled 2015-02-25 (×6): qty 1
  Filled 2015-02-25 (×2): qty 2
  Filled 2015-02-25 (×4): qty 1
  Filled 2015-02-25 (×3): qty 2
  Filled 2015-02-25: qty 1

## 2015-02-25 MED ORDER — OXYCODONE HCL 5 MG PO TABS
5.0000 mg | ORAL_TABLET | ORAL | Status: DC | PRN
  Administered 2015-03-02 (×2): 5 mg via ORAL
  Filled 2015-02-25 (×2): qty 1

## 2015-02-25 MED ORDER — CHLORHEXIDINE GLUCONATE 0.12% ORAL RINSE (MEDLINE KIT)
15.0000 mL | Freq: Two times a day (BID) | OROMUCOSAL | Status: DC
  Administered 2015-02-25 – 2015-03-04 (×9): 15 mL via OROMUCOSAL
  Filled 2015-02-25 (×16): qty 15

## 2015-02-25 MED ORDER — ANTISEPTIC ORAL RINSE SOLUTION (CORINZ)
7.0000 mL | OROMUCOSAL | Status: DC
  Administered 2015-02-25 – 2015-03-01 (×35): 7 mL via OROMUCOSAL
  Filled 2015-02-25 (×46): qty 7

## 2015-02-25 MED ORDER — LIDOCAINE 5 % EX PTCH
1.0000 | MEDICATED_PATCH | CUTANEOUS | Status: DC
  Administered 2015-02-25 – 2015-03-05 (×9): 1 via TRANSDERMAL
  Filled 2015-02-25 (×11): qty 1

## 2015-02-25 MED ORDER — MORPHINE SULFATE (PF) 2 MG/ML IV SOLN
2.0000 mg | Freq: Once | INTRAVENOUS | Status: AC
  Administered 2015-02-25: 2 mg via INTRAVENOUS

## 2015-02-25 MED ORDER — POLYETHYLENE GLYCOL 3350 17 G PO PACK
17.0000 g | PACK | Freq: Every day | ORAL | Status: DC | PRN
  Administered 2015-03-03: 17 g via ORAL
  Filled 2015-02-25: qty 1

## 2015-02-25 MED ORDER — LORAZEPAM 0.5 MG PO TABS
1.0000 mg | ORAL_TABLET | Freq: Every day | ORAL | Status: AC
  Administered 2015-02-25 – 2015-02-27 (×3): 1 mg via ORAL
  Filled 2015-02-25 (×3): qty 2

## 2015-02-25 MED ORDER — IPRATROPIUM-ALBUTEROL 0.5-2.5 (3) MG/3ML IN SOLN: 3.0000 mL | RESPIRATORY_TRACT | Status: DC | PRN

## 2015-02-25 MED ORDER — VANCOMYCIN HCL IN DEXTROSE 750-5 MG/150ML-% IV SOLN
750.0000 mg | Freq: Two times a day (BID) | INTRAVENOUS | Status: DC
  Administered 2015-02-25 – 2015-02-26 (×3): 750 mg via INTRAVENOUS
  Filled 2015-02-25 (×5): qty 150

## 2015-02-25 MED ORDER — IPRATROPIUM-ALBUTEROL 0.5-2.5 (3) MG/3ML IN SOLN
3.0000 mL | Freq: Four times a day (QID) | RESPIRATORY_TRACT | Status: AC
  Administered 2015-02-25 – 2015-02-27 (×7): 3 mL via RESPIRATORY_TRACT
  Filled 2015-02-25 (×6): qty 3

## 2015-02-25 MED ORDER — SODIUM CHLORIDE 0.9 % IV SOLN
1.0000 g | Freq: Three times a day (TID) | INTRAVENOUS | Status: DC
  Administered 2015-02-25 – 2015-03-01 (×13): 1 g via INTRAVENOUS
  Filled 2015-02-25 (×18): qty 1

## 2015-02-25 MED ORDER — FUROSEMIDE 10 MG/ML IJ SOLN
20.0000 mg | Freq: Once | INTRAMUSCULAR | Status: AC
  Administered 2015-02-25: 20 mg via INTRAVENOUS
  Filled 2015-02-25: qty 2

## 2015-02-25 MED ORDER — DEXTROSE 5 % IV SOLN
INTRAVENOUS | Status: DC
  Administered 2015-02-25 – 2015-02-28 (×4): via INTRAVENOUS

## 2015-02-25 MED ORDER — ACETAMINOPHEN 325 MG PO TABS
650.0000 mg | ORAL_TABLET | Freq: Four times a day (QID) | ORAL | Status: DC | PRN
  Administered 2015-03-02 – 2015-03-05 (×3): 650 mg via ORAL
  Filled 2015-02-25 (×4): qty 2

## 2015-02-25 NOTE — Progress Notes (Signed)
Chaplain rounded in the unit. Patient in pain on the right side. Asked for prayer. Chaplain provided a compassionate presence and support to the patient with prayer. Minerva Fester [phone]

## 2015-02-25 NOTE — Progress Notes (Signed)
Date: 02/25/2015,   MRN# [id number] Emily Kidd 03/05/1950 Code Status:     Code Status Orders        Start     Ordered   02/24/15 1530  Full code   Continuous     02/24/15 1529    Code Status History    Date Active Date Inactive Code Status Order ID Comments User Context   01/04/2015  8:57 AM 01/06/2015  8:46 PM Full Code [id number]  Harrie Foreman, MD Inpatient   12/15/2014  7:01 PM 12/16/2014  4:16 PM Full Code [id number]  Demetrios Loll, MD Inpatient   11/16/2014  5:48 AM 11/21/2014  4:42 PM Full Code [id number]  Verlin Dike, RN Inpatient   11/16/2014  4:31 AM 11/16/2014  5:47 AM DNR [id number]  Quintella Baton, MD Inpatient    Advance Directive Documentation        Most Recent Value   Type of Advance Directive  Healthcare Power of Attorney, Living will   Pre-existing out of facility DNR order (yellow form or pink MOST form)     "MOST" Form in Place?        HPI: This is a 65 year old lady who has had persistent non massive hemoptysis, left infiltrate, mediastinal nodes. S/p - ve EBUS (Dr. Rich Number). Hence thoroscopy was Done. Here in the ICU. Intermittent;y confused. On NRBM. Left hemothorax is opacified. Left chest tube in place, no significant drainage. See kc notes. Last pm she was placed on bipap, now on high flow, attempting to decrease fio2. Today she seem less dyspneic. Chest xrays reviewed.   PMHX:   Past Medical History  Diagnosis Date  . Hypertension   . COPD (chronic obstructive pulmonary disease) ([place])   . Pneumonia   . Sepsis ([place])   . Lung mass   . Diabetes mellitus without complication ([place])   . Dysrhythmia     TACHYCARDIA  . GERD (gastroesophageal reflux disease)   . Mitral regurgitation   . Carotid artery stenosis   . Muscle spasms of neck   . TIA (transient ischemic attack)   . Anxiety   . Hyperlipidemia   . Major depressive disorder ([place])   . Chronic pain syndrome   . Drug abuse in remission   . Vertigo   . Stable angina ([facility])   . Neuropathy ([place])    . Left-sided weakness     FROM TIA  . Coronary artery disease   . Shortness of breath dyspnea    Surgical Hx:  Past Surgical History  Procedure Laterality Date  . Endobronchial ultrasound N/A 11/24/2014    Procedure: ENDOBRONCHIAL ULTRASOUND;  Surgeon: Flora Lipps, MD;  Location: [facility] ORS;  Service: Cardiopulmonary;  Laterality: N/A;  . Above knee amputation Left   . Endobronchial ultrasound N/A 12/15/2014    Procedure: ENDOBRONCHIAL ULTRASOUND;  Surgeon: Flora Lipps, MD;  Location: [facility] ORS;  Service: Cardiopulmonary;  Laterality: N/A;   Family Hx:  Family History  Problem Relation Age of Onset  . Heart attack Father   . Stroke Father    Social Hx:   Social History  Substance Use Topics  . Smoking status: Former Smoker    Quit date: 09/30/2008  . Smokeless tobacco: None  . Alcohol Use: No   Medication:    Home Medication:  No current outpatient prescriptions on file.  Current Medication: '@CURMEDTAB'$ @   Allergies:  Azithromycin and Penicillins  Review of Systems: Gen:  Denies  fever, sweats, chills HEENT: Denies blurred vision, double  vision, ear pain, eye pain, hearing loss, nose bleeds, sore throat Cvc:  No dizziness, chest pain or heaviness Resp:  Sob, specks of blood in sputum.   Gi: Denies swallowing difficulty, stomach pain, nausea or vomiting, diarrhea, constipation, bowel incontinence Gu:  Denies bladder incontinence, burning urine Ext:   No Joint pain, stiffness or swelling Skin: No skin rash, easy bruising or bleeding or hives Endoc:  No polyuria, polydipsia , polyphagia or weight change Psych: No depression, insomnia or hallucinations  Other:  All other systems negative  Physical Examination:   VS: BP 152/80 mmHg  Pulse 118  Temp(Src) 97.9 F (36.6 C) (Oral)  Resp 32  Ht '5\' 4"'$  (1.626 m)  Wt 162 lb (73.483 kg)  BMI 27.79 kg/m2  SpO2 92%  General Appearance: No distress  Neuro/psych: without focal findings, mental status, speech normal, alert  and oriented, cranial nerves 2-12 intact, reflexes normal and symmetric, sensation grossly normal  HEENT: PERRLA, EOM intact, no ptosis, no other lesions noticed NECK: Supple, short neck, no stridor, mid line trachea, blood in sputum, scant Pulmonary:.decrease bs on the left and right base   Cardiovascular:  Normal S1,S2.  No m/r/g.  Abdomen:Benign, Soft, non-tender, No masses, hepatosplenomegaly, No lymphadenopathy Endoc: No evident thyromegaly, no signs of acromegaly or Cushing features Skin:   warm, no rashes, no ecchymosis  Extremities: normal, no cyanosis, clubbing, no edema, warm with normal capillary refill. Other findings:   Labs results:   Recent Labs     02/24/15  0947  02/25/15  0523  HGB  11.6*  11.8*  HCT  34.0*  37.5  MCV   --   81.9  WBC   --   19.0*  BUN   --   <5*  CREATININE   --   0.57  GLUCOSE  102*  151*  CALCIUM   --   8.4*  ,    No results for input(s): PH in the last 72 hours.  Invalid input(s): PCO2, PO2, BASEEXCESS, BASEDEFICITE, TFT  Culture results:     Rad results:   Dg Chest Port 1 View  02/25/2015  CLINICAL DATA:  Acute onset of hypoxemia. Recent left-sided thoracoscopy. Initial encounter. EXAM: PORTABLE CHEST 1 VIEW COMPARISON:  Chest radiograph performed 02/24/2015, and CTA of the chest performed 12/07/2014 FINDINGS: The lungs are hypoexpanded. Diffuse left-sided and more mild right basilar airspace opacities are again seen, perhaps slightly improved from the prior study. This was thought to reflect pneumonia on prior CTA. A left apical chest tube is unchanged in position. No definite pleural effusion or pneumothorax is seen. The cardiomediastinal silhouette is normal in size. No acute osseous abnormalities are identified. IMPRESSION: Lungs hypoexpanded. Diffuse left-sided and more mild right basilar airspace opacities again seen, perhaps slightly improved from the prior study. Left apical chest tube unchanged in position. Electronically Signed    By: Garald Balding M.D.   On: 02/25/2015 03:58   Assessment and Plan: S/p left thoracoscopy with biopsy, left chest tube in place, left hemothorax is opacified. ? Re expansion pulmonary edema, mucus/blood clot plugs, infection (doubt), on high flow 02. At 80 %. Post op care Pulmonary toilet, encourage seep coughing ? bronchoscopy per above dvt prophylaxis Serial cxr's Vanco/ pip for now Path pending Hold in the icu today   I have personally obtained a history, examined the patient, evaluated laboratory and imaging results, formulated the assessment and plan and placed orders.  The Patient requires high complexity decision making for assessment and support, frequent  evaluation and titration of therapies, application of advanced monitoring technologies and extensive interpretation of multiple databases.   Julianah Marciel,M.D. Pulmonary & Critical care Medicine [facility]

## 2015-02-25 NOTE — Progress Notes (Signed)
ANTIBIOTIC CONSULT NOTE - INITIAL  Pharmacy Consult for Vancomycin and meropenem Indication: pneumonia  Allergies  Allergen Reactions  . Azithromycin   . Penicillins Rash    Patient Measurements: Height: '5\' 4"'$  (162.6 cm) Weight: 162 lb (73.483 kg) IBW/kg (Calculated) : 54.7   Vital Signs: Temp: 98.3 F (36.8 C) (01/25 0400) Temp Source: Oral (01/25 0400) BP: 121/97 mmHg (01/25 0600) Pulse Rate: 116 (01/25 0600) Intake/Output from previous day: 01/24 0701 - 01/25 0700 In: 2150 [I.V.:2150] Out: 2563 [Urine:2400; Blood:23; Chest Tube:140] Intake/Output from this shift:    Labs:  Recent Labs  02/24/15 0947 02/25/15 0523  WBC  --  19.0*  HGB 11.6* 11.8*  PLT  --  427  CREATININE  --  0.57   Estimated Creatinine Clearance: 69.8 mL/min (by C-G formula based on Cr of 0.57). No results for input(s): VANCOTROUGH, VANCOPEAK, VANCORANDOM, GENTTROUGH, GENTPEAK, GENTRANDOM, TOBRATROUGH, TOBRAPEAK, TOBRARND, AMIKACINPEAK, AMIKACINTROU, AMIKACIN in the last 72 hours.   Microbiology: Recent Results (from the past 720 hour(s))  Surgical pcr screen     Status: None   Collection Time: 02/18/15  1:23 PM  Result Value Ref Range Status   MRSA, PCR NEGATIVE NEGATIVE Final   Staphylococcus aureus NEGATIVE NEGATIVE Final    Comment:        The Xpert SA Assay (FDA approved for NASAL specimens in patients over 30 years of age), is one component of a comprehensive surveillance program.  Test performance has been validated by [facility] for patients greater than or equal to 89 year old. It is not intended to diagnose infection nor to guide or monitor treatment.     Assessment: 65 yo female here with hemoptysis s/p left thoracoscopy to start vancomycin and meropenem for HCAP. Pt received vancomycin pre and post-op.  Ke 0.055, half life 12.6 h, Vd 51.5 L  Goal of Therapy:  Vancomycin trough level 15-20 mcg/ml  Plan:  Will order vancomycin 750 mg IV q12h Trough before 4th  dose - 1/26 at 2000 Will need to continue to follow renal function and culture results  Will order meropenem 1 g IV q8h  Pharmacy will continue to follow.   Rayna Sexton L 02/25/2015,8:10 AM

## 2015-02-25 NOTE — Clinical Documentation Improvement (Signed)
Pulmonology  Please clarify the acuity of the patient's pulmonary edema. Document findings in next progress note; NOT in BPA drop down box.    Acuity - Acute, Chronic, Acute on Chronic Pulmonary Edema  Other  Clinically Undetermined  Supporting Information:  S/p left thoracoscopy with biopsy, left chest tube in place, left hemothorax is opacified. ? Re expansion pulmonary edema, mucus plugs, infection, NRBM   Please exercise your independent, professional judgment when responding. A specific answer is not anticipated or expected.  Thank You, Zoila Shutter RN, BSN, [place] [phone]; Cell: [phone]

## 2015-02-25 NOTE — Anesthesia Postprocedure Evaluation (Signed)
Anesthesia Post Note  Patient: Emily Kidd  Procedure(s) Performed: Procedure(s) (LRB): Preoperative bronchoscopy, left thoracoscopy and lung biopsy (Left)  Patient location during evaluation: ICU Anesthesia Type: General Level of consciousness: awake and alert Pain management: pain level controlled Vital Signs Assessment: post-procedure vital signs reviewed and stable Respiratory status: patient remains intubated per anesthesia plan Cardiovascular status: stable and blood pressure returned to baseline Anesthetic complications: no    Last Vitals:  Filed Vitals:   02/25/15 0500 02/25/15 0600  BP: 133/90 121/97  Pulse: 119 116  Temp:    Resp: 17 13    Last Pain:  Filed Vitals:   02/25/15 0754  PainSc: 0-No pain                 Alison Stalling

## 2015-02-25 NOTE — Progress Notes (Signed)
[place] Progress Note Patient Name: Emily Kidd DOB: 10/04/1950 MRN: [id number]   Date of Service  02/25/2015  HPI/Events of Note  Portable CXR STAT - looks very similar to the film from earlier today.   eICU Interventions  Will order: 1. BiPAP - IPAP 12/EPAP 5. Keep sats >- 92%.  2. Lasix 20 mg IV now.  3. ABG at 5:15 AM     Intervention Category Major Interventions: Hypoxemia - evaluation and management  Ermagene Saidi Eugene 02/25/2015, 4:00 AM

## 2015-02-25 NOTE — Clinical Documentation Improvement (Signed)
Pulmonology  Patient placed on BIPAP after hypoxia documented by [facility]. Reviewed CXR which revealed white out of left chest. Had increasing O2 requirements. Please provide a diagnosis associated with the described conditions and the treatment given to patient. Document findings in next progress note; NOT in BPA drop down box.    Other  Clinically Undetermined  Supporting Information:  HR > 100, RR ranging from 16 to 24  Oxygen saturations on non-rebreather were 91%  Improved to 95% when placed on BIPAP  Please exercise your independent, professional judgment when responding. A specific answer is not anticipated or expected.  Thank You, Zoila Shutter RN, BSN, [place] [phone]; Cell: [phone]

## 2015-02-25 NOTE — Progress Notes (Signed)
Pt complained of nausea one time, zofran given. Pt did not have any emesis.  Pt now resting comfortably.

## 2015-02-25 NOTE — Progress Notes (Signed)
[facility] Inpatient Post-Op Note  Patient ID: Emily Kidd, female   DOB: [birth year], 65 y.o.   MRN: [id number]  HISTORY: She did well with her thoracoscopic lung biopsy yesterday. Today she is on BiPAP. She states she is not short of breath and does not have any pain. Her only complaints morning was that she is thirsty.   Filed Vitals:   02/25/15 0600 02/25/15 0800  BP: 121/97 123/74  Pulse: 116 116  Temp:  97.9 F (36.6 C)  Resp: 13 27     EXAM: Resp: Her breath sounds are normal on the right and diminished on the left with coarse rhonchi..  No respiratory distress, normal effort. Heart:  Regular without murmurs  There is no air leak. There is minimal drainage.   ASSESSMENT: Status post thoracoscopic lung biopsy for pulmonary hemorrhage.    PLAN:   She is being assessed by our pulmonologist for their medical input. From my standpoint she could be moved to the floor however respiratory status remains somewhat tenuous. I will defer those decisions to Dr. Stevenson Clinch.      Nestor Lewandowsky, MD

## 2015-02-25 NOTE — Progress Notes (Signed)
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 Pt is also refusing to sit at the side of the bed.   This AM pt was continually removing Bipap mask. Dr. Stevenson Clinch was ok with pt returning to [place].

## 2015-02-25 NOTE — Progress Notes (Signed)
Pt is refusing NG tube placement.  Pt educated on the need for tube.  Pt still refused.  Dr. Oletta Darter notified.

## 2015-02-25 NOTE — Progress Notes (Signed)
Patient ID: Emily Kidd, female   DOB: 08-Dec-1950, 65 y.o.   MRN: [id number] [facility] PROGRESS NOTE  Emily Kidd NID:[id number] DOB: [birth year] DOA: 02/24/2015 PCP: [facility]  HPI/Subjective: Patient short of breath. Left-sided chest pain. Patient currently on high flow nasal cannula 75% FiO2  Objective: Filed Vitals:   02/25/15 1200 02/25/15 1300  BP: 132/78 152/80  Pulse: 106 118  Temp: 97.9 F (36.6 C)   Resp: 18 32    Filed Weights   02/24/15 0909  Weight: 73.483 kg (162 lb)    ROS: Review of Systems  Constitutional: Negative for fever and chills.  Eyes: Negative for blurred vision.  Respiratory: Positive for cough and shortness of breath.   Cardiovascular: Positive for chest pain.  Gastrointestinal: Negative for nausea, vomiting, abdominal pain, diarrhea and constipation.  Genitourinary: Negative for dysuria.  Musculoskeletal: Negative for joint pain.  Neurological: Negative for dizziness and headaches.   Exam: Physical Exam  Constitutional: She is oriented to person, place, and time.  HENT:  Nose: No mucosal edema.  Mouth/Throat: No oropharyngeal exudate or posterior oropharyngeal edema.  Eyes: Conjunctivae, EOM and lids are normal. Pupils are equal, round, and reactive to light.  Neck: No JVD present. Carotid bruit is not present. No edema present. No thyroid mass and no thyromegaly present.  Cardiovascular: S1 normal and S2 normal.  Exam reveals no gallop.   Murmur heard.  Systolic murmur is present with a grade of 3/6  Pulses:      Dorsalis pedis pulses are 2+ on the right side, and 2+ on the left side.  Respiratory: No respiratory distress. She has decreased breath sounds in the left lower field. She has wheezes in the right middle field, the right lower field and the left middle field. She has rhonchi in the right middle field, the right lower field and the left middle field. She has no rales.  GI: Soft. Bowel sounds are  normal. There is no tenderness.  Musculoskeletal:       Right ankle: She exhibits swelling.  Lymphadenopathy:    She has no cervical adenopathy.  Neurological: She is alert and oriented to person, place, and time.  Left-sided paralysis  Skin: Skin is warm. No rash noted. Nails show no clubbing.  Psychiatric: She has a normal mood and affect.      Data Reviewed: Basic Metabolic Panel:  Recent Labs Lab 02/24/15 0947 02/25/15 0523  NA 143 152*  K 3.6 4.1  CL  --  117*  CO2  --  28  GLUCOSE 102* 151*  BUN  --  <5*  CREATININE  --  0.57  CALCIUM  --  8.4*   Liver Function Tests:  Recent Labs Lab 02/25/15 0523  AST 17  ALT 13*  ALKPHOS 101  BILITOT 0.8  PROT 7.0  ALBUMIN 3.1*   CBC:  Recent Labs Lab 02/24/15 0947 02/25/15 0523  WBC  --  19.0*  HGB 11.6* 11.8*  HCT 34.0* 37.5  MCV  --  81.9  PLT  --  427   CBG:  Recent Labs Lab 02/24/15 1404 02/24/15 1731 02/25/15 0013 02/25/15 0626 02/25/15 1151  GLUCAP 108* 156* 158* 147* 123*    Recent Results (from the past 240 hour(s))  Surgical pcr screen     Status: None   Collection Time: 02/18/15  1:23 PM  Result Value Ref Range Status   MRSA, PCR NEGATIVE NEGATIVE Final   Staphylococcus aureus NEGATIVE NEGATIVE Final  Comment:        The Xpert SA Assay (FDA approved for NASAL specimens in patients over 12 years of age), is one component of a comprehensive surveillance program.  Test performance has been validated by [facility] for patients greater than or equal to 75 year old. It is not intended to diagnose infection nor to guide or monitor treatment.   Anaerobic culture     Status: None (Preliminary result)   Collection Time: 02/24/15 12:04 PM  Result Value Ref Range Status   Specimen Description LUNG  Final   Special Requests NONE  Final   Culture PENDING  Incomplete   Report Status PENDING  Incomplete  Fungus Culture with Smear     Status: None (Preliminary result)   Collection Time:  02/24/15 12:04 PM  Result Value Ref Range Status   Specimen Description LUNG  Final   Special Requests NONE  Final   Culture NO FUNGUS ISOLATED <24 HOURS  Final   Report Status PENDING  Incomplete  Tissue culture     Status: None (Preliminary result)   Collection Time: 02/24/15 12:04 PM  Result Value Ref Range Status   Specimen Description LUNG  Final   Special Requests NONE  Final   Gram Stain PENDING  Incomplete   Culture NO GROWTH < 24 HOURS  Final   Report Status PENDING  Incomplete     Studies: Dg Chest Port 1 View  02/25/2015  CLINICAL DATA:  Acute onset of hypoxemia. Recent left-sided thoracoscopy. Initial encounter. EXAM: PORTABLE CHEST 1 VIEW COMPARISON:  Chest radiograph performed 02/24/2015, and CTA of the chest performed 12/07/2014 FINDINGS: The lungs are hypoexpanded. Diffuse left-sided and more mild right basilar airspace opacities are again seen, perhaps slightly improved from the prior study. This was thought to reflect pneumonia on prior CTA. A left apical chest tube is unchanged in position. No definite pleural effusion or pneumothorax is seen. The cardiomediastinal silhouette is normal in size. No acute osseous abnormalities are identified. IMPRESSION: Lungs hypoexpanded. Diffuse left-sided and more mild right basilar airspace opacities again seen, perhaps slightly improved from the prior study. Left apical chest tube unchanged in position. Electronically Signed   By: Garald Balding M.D.   On: 02/25/2015 03:58   Dg Chest Port 1 View  02/24/2015  CLINICAL DATA:  Postoperative evaluation. EXAM: PORTABLE CHEST 1 VIEW COMPARISON:  Chest radiograph 01/15/2015. FINDINGS: Left-sided chest tubes in place. There is complete opacification left hemi thorax. The cardiac and mediastinal contours are largely obscured. Heterogeneous opacities right mid and lower lung. Low lung volumes. No definite pneumothorax. Can't exclude left-sided pleural fluid. IMPRESSION: Complete opacification of the  left hemi thorax is nonspecific and may be secondary to infection, edema and/or hemorrhage. Left chest tubes in place. Heterogeneous opacities right lung base may represent atelectasis, infection and/or aspiration. Electronically Signed   By: Lovey Newcomer M.D.   On: 02/24/2015 14:03    Scheduled Meds: . atorvastatin  40 mg Oral QHS  . bisacodyl  10 mg Oral Daily  . carvedilol  3.125 mg Oral BID  . insulin aspart  0-9 Units Subcutaneous TID WC  . ipratropium-albuterol  3 mL Nebulization Q6H  . lidocaine  1 patch Transdermal Q24H  . LORazepam  1 mg Oral QHS  . meropenem (MERREM) IV  1 g Intravenous 3 times per day  . oxybutynin  5 mg Oral QHS  . pantoprazole  40 mg Oral BH-q7a  . pregabalin  50 mg Oral BID  . traZODone  100 mg Oral QHS  . vancomycin  750 mg Intravenous Q12H  . venlafaxine XR  75 mg Oral Daily   Continuous Infusions: . dextrose 50 mL/hr at 02/25/15 0848    Assessment/Plan:  1. Acute respiratory failure with hypoxia. Patient currently on 75% FiO2 high flow nasal cannula. Patient requires ICU care. 2. Chest pain status post thoracoscopy and lung biopsy for pulmonary hemorrhage. When necessary IV morphine and oral oxycodone. 3. Suspected pneumonia vancomycin and meropenem. 4. Essential hypertension on Coreg 5. Type 2 diabetes mellitus on sliding scale 6. History of CVA with left-sided paralysis- off anticoagulation at this point.  7. Hyperlipidemia unspecified on atorvastatin  Code Status:     Code Status Orders        Start     Ordered   02/24/15 1530  Full code   Continuous     02/24/15 1529    Code Status History    Date Active Date Inactive Code Status Order ID Comments User Context   01/04/2015  8:57 AM 01/06/2015  8:46 PM Full Code [id number]  Harrie Foreman, MD Inpatient   12/15/2014  7:01 PM 12/16/2014  4:16 PM Full Code [id number]  Demetrios Loll, MD Inpatient   11/16/2014  5:48 AM 11/21/2014  4:42 PM Full Code [id number]  Verlin Dike, RN Inpatient    11/16/2014  4:31 AM 11/16/2014  5:47 AM DNR [id number]  Quintella Baton, MD Inpatient    Advance Directive Documentation        Most Recent Value   Type of Advance Directive  Healthcare Power of Attorney, Living will   Pre-existing out of facility DNR order (yellow form or pink MOST form)     "MOST" Form in Place?       Disposition Plan: to be determined  Consultants:  Pulmonary  Cardiothoracic surgery  Procedures:  Thoracoscopy lung biopsy and chest tube  Antibiotics:  Vancomycin  Meropenem  Time spent: 30 minutes, patient is critically ill  Loletha Grayer  [facility]

## 2015-02-25 NOTE — Care Management (Signed)
Patient well known to this care manager.  She receives home health services through [employer].  Agency notified of admission.  She was admitted after elective procedure- thoracoscopy with lung  biopsy of left upper and lower lobe.  Patient now with chest tube.  She is on high flow 02 at 80%

## 2015-02-25 NOTE — Progress Notes (Signed)
Pt called nurse to the room

## 2015-02-25 NOTE — Progress Notes (Signed)
[facility] Progress Note Patient Name: Emily Kidd DOB: 1950/05/02 MRN: [id number]   Date of Service  02/25/2015  HPI/Events of Note  Hypoxia - increasing Q2 requirement. Review of CXR post op >> white of of L chest, L chest tube in place. No pneumothorax. RLL and RML atelectasis vs infiltrate.   eICU Interventions  Will order: 1. Portable CXR now. 2. Incentive Spirometry.      Intervention Category Major Interventions: Hypoxemia - evaluation and management  Sommer,Steven Eugene 02/25/2015, 3:37 AM

## 2015-02-26 ENCOUNTER — Encounter: Payer: Self-pay | Admitting: Cardiothoracic Surgery

## 2015-02-26 ENCOUNTER — Inpatient Hospital Stay: Payer: Medicare (Managed Care)

## 2015-02-26 LAB — BASIC METABOLIC PANEL
Anion gap: 6 (ref 5–15)
Anion gap: 9 (ref 5–15)
BUN: 6 mg/dL (ref 6–20)
BUN: 6 mg/dL (ref 6–20)
CHLORIDE: 110 mmol/L (ref 101–111)
CO2: 26 mmol/L (ref 22–32)
CO2: 22 mmol/L (ref 22–32)
CREATININE: 0.62 mg/dL (ref 0.44–1.00)
Calcium: 8.2 mg/dL — ABNORMAL LOW (ref 8.9–10.3)
Calcium: 8.2 mg/dL — ABNORMAL LOW (ref 8.9–10.3)
Chloride: 111 mmol/L (ref 101–111)
Creatinine, Ser: 0.58 mg/dL (ref 0.44–1.00)
GFR calc Af Amer: >60 mL/min (ref >60)
GFR calc non Af Amer: >60 mL/min (ref >60)
GFR calc non Af Amer: >60 mL/min (ref >60)
GLUCOSE: 166 mg/dL — AB (ref 65–99)
Glucose, Bld: 152 mg/dL — ABNORMAL HIGH (ref 65–99)
POTASSIUM: 3.7 mmol/L (ref 3.5–5.1)
Potassium: 4.2 mmol/L (ref 3.5–5.1)
Sodium: 143 mmol/L (ref 135–145)
Sodium: 141 mmol/L (ref 135–145)

## 2015-02-26 LAB — CBC
HCT: 38.4 % (ref 35.0–47.0)
Hemoglobin: 11.7 g/dL — ABNORMAL LOW (ref 12.0–16.0)
MCH: 25.1 pg — AB (ref 26.0–34.0)
MCHC: 30.4 g/dL — ABNORMAL LOW (ref 32.0–36.0)
MCV: 82.5 fL (ref 80.0–100.0)
PLATELETS: 319 10*3/uL (ref 150–440)
RBC: 4.65 MIL/uL (ref 3.80–5.20)
RDW: 17.3 % — ABNORMAL HIGH (ref 11.5–14.5)
WBC: 17.4 10*3/uL — ABNORMAL HIGH (ref 3.6–11.0)

## 2015-02-26 LAB — GLUCOSE, CAPILLARY
Glucose-Capillary: 119 mg/dL — ABNORMAL HIGH (ref 65–99)
Glucose-Capillary: 140 mg/dL — ABNORMAL HIGH (ref 65–99)
Glucose-Capillary: 123 mg/dL — ABNORMAL HIGH (ref 65–99)
Glucose-Capillary: 126 mg/dL — ABNORMAL HIGH (ref 65–99)

## 2015-02-26 MED ORDER — ENSURE ENLIVE PO LIQD
237.0000 mL | Freq: Three times a day (TID) | ORAL | Status: DC
  Administered 2015-02-26 – 2015-03-05 (×19): 237 mL via ORAL

## 2015-02-26 NOTE — Progress Notes (Signed)
   02/26/15 1900  Clinical Encounter Type  Visited With Patient  Visit Type Follow-up  Consult/Referral To Chaplain  Chaplain attempted to follow up with patient but patient was asleep.   [place] [phone]

## 2015-02-26 NOTE — Progress Notes (Signed)
   02/26/15 1608  Clinical Encounter Type  Visited With Patient  Visit Type Initial  Referral From Nurse  Consult/Referral To Chaplain  Chaplain went to speak to patient but physical therapy was in with her at time. I informed patient that I would be back later to talk with her.   Chaplain Jayne Peckenpaugh Ext: [phone]

## 2015-02-26 NOTE — Care Management (Signed)
Patient on high flow 02 and 02 sats in the 80's with exertion.  Physical therapy is recommending skilled nursing placement.  Patient has been informed she has lung cancer

## 2015-02-26 NOTE — Progress Notes (Signed)
Pt was able to sit at the bedside with PT help today at 1630. Pt continues to complain of pain from her left arm, MD aware, pain relieved by 2 mg morphine.   [name] was called to offer support after the new diagnosis.   Dressing was changed.  Pt continues to not eat meals but is drinking ensure.    Pt in stable condition.

## 2015-02-26 NOTE — Progress Notes (Addendum)
Date: 02/26/2015,   MRN# [id number] GENIYAH EISCHEID [birth year] Code Status:     Code Status Orders        Start     Ordered   02/24/15 1530  Full code   Continuous     02/24/15 1529    Code Status History    Date Active Date Inactive Code Status Order ID Comments User Context   01/04/2015  8:57 AM 01/06/2015  8:46 PM Full Code [id number]  Harrie Foreman, MD Inpatient   12/15/2014  7:01 PM 12/16/2014  4:16 PM Full Code [id number]  Demetrios Loll, MD Inpatient   11/16/2014  5:48 AM 11/21/2014  4:42 PM Full Code [id number]  Verlin Dike, RN Inpatient   11/16/2014  4:31 AM 11/16/2014  5:47 AM DNR [id number]  Quintella Baton, MD Inpatient    Advance Directive Documentation        Most Recent Value   Type of Advance Directive  Healthcare Power of Attorney, Living will   Pre-existing out of facility DNR order (yellow form or pink MOST form)     "MOST" Form in Place?       HPI: less sob, fio2 slowly coming down (70 %). Reason for hemoptysis not known. Broad differential. S/o open bx, path pending.   PMHX:   Past Medical History  Diagnosis Date  . Hypertension   . COPD (chronic obstructive pulmonary disease) ([place])   . Pneumonia   . Sepsis ([place])   . Lung mass   . Diabetes mellitus without complication ([place])   . Dysrhythmia     TACHYCARDIA  . GERD (gastroesophageal reflux disease)   . Mitral regurgitation   . Carotid artery stenosis   . Muscle spasms of neck   . TIA (transient ischemic attack)   . Anxiety   . Hyperlipidemia   . Major depressive disorder ([place])   . Chronic pain syndrome   . Drug abuse in remission   . Vertigo   . Stable angina (H[facility])   . Neuropathy ([place])   . Left-sided weakness     FROM TIA  . Coronary artery disease   . Shortness of breath dyspnea    Surgical Hx:  Past Surgical History  Procedure Laterality Date  . Endobronchial ultrasound N/A 11/24/2014    Procedure: ENDOBRONCHIAL ULTRASOUND;  Surgeon: Flora Lipps, MD;  Location: [facility] ORS;  Service:  Cardiopulmonary;  Laterality: N/A;  . Above knee amputation Left   . Endobronchial ultrasound N/A 12/15/2014    Procedure: ENDOBRONCHIAL ULTRASOUND;  Surgeon: Flora Lipps, MD;  Location: [facility] ORS;  Service: Cardiopulmonary;  Laterality: N/A;   Family Hx:  Family History  Problem Relation Age of Onset  . Heart attack Father   . Stroke Father    Social Hx:   Social History  Substance Use Topics  . Smoking status: Former Smoker    Quit date: 09/30/2008  . Smokeless tobacco: None  . Alcohol Use: No   Medication:    Home Medication:  No current outpatient prescriptions on file.  Current Medication: '@CURMEDTAB'$ @   Allergies:  Azithromycin and Penicillins  Review of Systems: Gen:  Denies  fever, sweats, chills HEENT: Denies blurred vision, double vision, ear pain, eye pain, hearing loss, nose bleeds, sore throat Cvc:  No dizziness, chest pain or heaviness Resp:   Less sob, scant blood in sputum Gi: Denies swallowing difficulty, stomach pain, nausea or vomiting, diarrhea, constipation, bowel incontinence Gu:  Denies bladder incontinence, burning urine Ext:   No Joint pain, stiffness  or swelling Skin: No skin rash, easy bruising or bleeding or hives Endoc:  No polyuria, polydipsia , polyphagia or weight change Psych: No depression, insomnia or hallucinations  Other:  All other systems negative  Physical Examination:   VS: BP 118/74 mmHg  Pulse 117  Temp(Src) 98.3 F (36.8 C) (Oral)  Resp 21  Ht '5\' 4"'$  (1.626 m)  Wt 162 lb (73.483 kg)  BMI 27.79 kg/m2  SpO2 95%  General Appearance: No distress, obese  Neuro: without focal findings, mental status, speech normal, alert and oriented, cranial nerves 2-12 intact, reflexes normal and symmetric, sensation grossly normal  HEENT: PERRLA, EOM intact, no ptosis, no other lesions noticed NECK; Supple, short neck: Pulmonary:.No wheezing, No rales  decrease on the left and right base:   Cardiovascular:  Normal S1,S2.  No m/r/g.   Abdominal aorta pulsation normal.    Abdomen:Benign, Soft, non-tender, No masses, hepatosplenomegaly, No lymphadenopathy Endoc: No evident thyromegaly, no signs of acromegaly or Cushing features Skin:   warm, no rashes, no ecchymosis  Extremities: normal, no cyanosis, clubbing, no edema, warm with normal capillary refill. Other findings:   Labs results:   Recent Labs     02/24/15  0947  02/25/15  0523  02/26/15  1019  HGB  11.6*  11.8*   --   HCT  34.0*  37.5   --   MCV   --   81.9   --   WBC   --   19.0*   --   BUN   --   <5*  6  CREATININE   --   0.57  0.58  GLUCOSE  102*  151*  166*  CALCIUM   --   8.4*  8.2*  ,    No results for input(s): PH in the last 72 hours.  Invalid input(s): PCO2, PO2, BASEEXCESS, BASEDEFICITE, TFT  Culture results:     Rad results:   Dg Chest Port 1 View  02/26/2015  CLINICAL DATA:  Postop check. EXAM: PORTABLE CHEST 1 VIEW COMPARISON:  February 25, 2015 FINDINGS: The left chest tube remains. No pneumothorax. Diffuse opacification of left hemithorax is stable. Mild opacity in the right base is probably atelectasis. No other acute abnormalities. IMPRESSION: No interval change. Persistent opacity in the left lung diffusely and the right base. Electronically Signed   By: Dorise Bullion III M.D   On: 02/26/2015 10:54   Assessment and Plan: S/p left thoracoscopy with biopsy, left chest tube in place, left hemothorax is opacified. ? PAH, malignancy, Re expansion pulmonary edema, mucus/blood clot plugs, doubt pneumonia on high flow 02. At 70 %. Down a little. Post op care Pulmonary toilet, encourage deep coughing dvt prophylaxis Serial cxr's Path pending, hopefully get it today Agree with checking ana, ra, anca, ace, hypersensitivity pneumonitis panel     I have personally obtained a history, examined the patient, evaluated laboratory and imaging results, formulated the assessment and plan and placed orders.  The Patient requires high complexity  decision making for assessment and support, frequent evaluation and titration of therapies, application of advanced monitoring technologies and extensive interpretation of multiple databases.   Herbon Fleming,M.D. Pulmonary & Critical care Medicine [facility]

## 2015-02-26 NOTE — Progress Notes (Signed)
Initial Nutrition Assessment     INTERVENTION:  Meals and snacks: Cater to pt preferences Medical Nutrition Supplement Therapy: Recommend ensure TID for added nutrition   NUTRITION DIAGNOSIS:   Inadequate oral intake related to acute illness as evidenced by meal completion < 50%.    GOAL:   Patient will meet greater than or equal to 90% of their needs    MONITOR:    (Energy intake, Electrolyte and renal profile)  REASON FOR ASSESSMENT:   Malnutrition Screening Tool    ASSESSMENT:      Pt admitted with acute respiratory failure with hypoxia, pulmonary hemorrhage.  Pt s/p thoracoscopy with biopsy, chest tube placed path pending  Past Medical History  Diagnosis Date  . Hypertension   . COPD (chronic obstructive pulmonary disease) ([place])   . Pneumonia   . Sepsis ([place])   . Lung mass   . Diabetes mellitus without complication ([place])   . Dysrhythmia     TACHYCARDIA  . GERD (gastroesophageal reflux disease)   . Mitral regurgitation   . Carotid artery stenosis   . Muscle spasms of neck   . TIA (transient ischemic attack)   . Anxiety   . Hyperlipidemia   . Major depressive disorder ([place])   . Chronic pain syndrome   . Drug abuse in remission   . Vertigo   . Stable angina ([facility])   . Neuropathy ([place])   . Left-sided weakness     FROM TIA  . Coronary artery disease   . Shortness of breath dyspnea     Current Nutrition: ate some of breakfast this am, tray observed  Food/Nutrition-Related History: pt reports good appetite prior to admission   Scheduled Medications:  . antiseptic oral rinse  7 mL Mouth Rinse 10 times per day  . atorvastatin  40 mg Oral QHS  . bisacodyl  10 mg Oral Daily  . carvedilol  3.125 mg Oral BID  . chlorhexidine gluconate  15 mL Mouth Rinse BID  . docusate sodium  100 mg Oral BID  . feeding supplement (ENSURE ENLIVE)  237 mL Oral TID  . insulin aspart  0-9 Units Subcutaneous TID WC  . ipratropium-albuterol  3 mL Nebulization Q6H  .  lidocaine  1 patch Transdermal Q24H  . LORazepam  1 mg Oral QHS  . meropenem (MERREM) IV  1 g Intravenous 3 times per day  . oxybutynin  5 mg Oral QHS  . pantoprazole  40 mg Oral BH-q7a  . pregabalin  200 mg Oral BID  . traZODone  100 mg Oral QHS  . vancomycin  750 mg Intravenous Q12H  . venlafaxine XR  75 mg Oral Daily    Continuous Medications:  . dextrose 50 mL/hr at 02/26/15 1200     Electrolyte/Renal Profile and Glucose Profile:   Recent Labs Lab 02/24/15 0947 02/25/15 0523 02/26/15 1019  NA 143 152* 143  K 3.6 4.1 3.7  CL  --  117* 111  CO2  --  28 26  BUN  --  <5* 6  CREATININE  --  0.57 0.58  CALCIUM  --  8.4* 8.2*  GLUCOSE 102* 151* 166*   Protein Profile:  Recent Labs Lab 02/25/15 0523  ALBUMIN 3.1*    Gastrointestinal Profile: Last BM: unknown   Nutrition-Focused Physical Exam Findings:  Unable to complete Nutrition-Focused physical exam at this time.     Weight Change: stable wt per pt   Diet Order:  DIET DYS 3 Room service appropriate?: Yes with Assist;  Fluid consistency:: Thin  Skin:   reviewed   Height:   Ht Readings from Last 1 Encounters:  02/24/15 '5\' 4"'$  (1.626 m)    Weight:   Wt Readings from Last 1 Encounters:  02/24/15 162 lb (73.483 kg)    Ideal Body Weight:     BMI:  Body mass index is 27.79 kg/(m^2).  Estimated Nutritional Needs:   Kcal:  BEE 1025 kcals (IF 1.1-1.3, AF 1.2)1353-1599 kcals/d. Usign IBW of 50kg  Protein:  (1.2-1.5 g/kg) 60-75 g/d  Fluid:  (25-23m/kg) 1250-15057md  EDUCATION NEEDS:   No education needs identified at this time  MOHamletAlZenia ResidesRDMartins CreekLDFord Cliffpager) Weekend/On-Call pager [phone]

## 2015-02-26 NOTE — Evaluation (Signed)
Physical Therapy Evaluation Patient Details Name: Emily Kidd MRN: [id number] DOB: 01/01/1951 Today's Date: 02/26/2015   History of Present Illness  Pt is a 65 y.o. female s/p L thoracoscopy with biopsy L upper and L lower lobes secondary to pulmonary hemorrhage (pt with several weak h/o hemoptysis).  Pt initially on BiPAP post-op and now on HFNC.  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  Clinical Impression  Prior to admission, pt was modified independent w/c level functional mobility (pt does not have prosthesis for L AKA).  Pt lives with her mother in 1 level home with ramp to enter.  Pt reports no active movement L UE since stroke in 2010 and PT kept pt's R UE NWB'ing during session d/t R radial arterial line.  Currently pt requires 2 assist for bed mobility but CGA once pt set up sitting on edge of bed; pt able to sit on edge of bed for almost 1 minute before requesting to lay down d/t fatigue and O2 desaturated to 84% on HFNC (O2 improved >94% with vc's for breathing technique once pt laying down in bed with HOB elevated).  Nursing present entire session and aware of all of above and also that pt's L hand and forearm appearing cold (pt also c/o 10/10 L UE pain and nursing aware and giving pt pain meds for this).  Pt would benefit from skilled PT to address noted impairments and functional limitations.  Recommend pt discharge to STR when medically appropriate.     Follow Up Recommendations SNF    Equipment Recommendations       Recommendations for Other Services       Precautions / Restrictions Precautions Precautions: Fall Precaution Comments: Aspiration; L chest tube; L AKA; L sided weakness Restrictions Weight Bearing Restrictions: No      Mobility  Bed Mobility Overal bed mobility: Needs Assistance;+2 for physical assistance Bed Mobility: Supine to Sit;Sit to Supine     Supine to sit: Mod assist;+2 for physical  assistance;+2 for safety/equipment;HOB elevated Sit to supine: Mod assist;+2 for physical assistance;+2 for safety/equipment;HOB elevated   General bed mobility comments: pt able to move R LE towards L side of bed without assist but required 2 assist for trunk to sit up and to scoot to edge of bed; 2 assist to lay down in bed (assist for trunk and R LE)  Transfers                 General transfer comment: deferred d/t pt requesting to lay back down and O2 desaturation  Ambulation/Gait                Stairs            Wheelchair Mobility    Modified Rankin (Stroke Patients Only)       Balance Overall balance assessment: Needs assistance Sitting-balance support: No upper extremity supported;Feet unsupported Sitting balance-Leahy Scale: Fair Sitting balance - Comments: once pt set up in sitting pt CGA for safety; forward flexed posture noted                                     Pertinent Vitals/Pain Pain Assessment: 0-10 Pain Score: 10-Worst pain ever Pain Location: L UE Pain Descriptors / Indicators: Tender;Aching Pain Intervention(s): Limited activity within patient's tolerance;Monitored during session;Repositioned;RN gave pain meds during session  See flowsheet for  details. HR 112-124 bpm during session.    Home Living Family/patient expects to be discharged to:: Private residence Living Arrangements: Parent;Other (Comment) (Pt reports living with her mother.  CM reports pt has 3 grandchildren at home that pt takes care of)     Home Access: Ramped entrance     Home Layout: One level Home Equipment: Wheelchair - manual      Prior Function Level of Independence: Independent with assistive device(s)         Comments: Per notes, pt is part of P[facility] program.  Pt is independent w/c level (transfers only).     Hand Dominance        Extremity/Trunk Assessment   Upper Extremity Assessment: RUE deficits/detail;LUE  deficits/detail RUE Deficits / Details: Deferred d/t R arterial line (pt's wrist immobilized) RUE: Unable to fully assess due to immobilization   LUE Deficits / Details: L hand fingers in flexed contracture position (especially L index finger);  L elbow extension ROM to neutral and flexion to 90 degrees (resistance noted at 90 degrees); no active movement noted of L UE (pt reports this is baseline from prior stroke)   Lower Extremity Assessment: RLE deficits/detail;LLE deficits/detail RLE Deficits / Details: R LE appears with generalized weakness LLE Deficits / Details: L AKA (no active movement noted--pt reports this is baseline level since stroke)  Cervical / Trunk Assessment: Normal  Communication   Communication: No difficulties  Cognition Arousal/Alertness: Awake/alert Behavior During Therapy: WFL for tasks assessed/performed Overall Cognitive Status: Within Functional Limits for tasks assessed                      General Comments General comments (skin integrity, edema, etc.): L UE hand and forearm appearing cold (nursing present and notified); L AKA  Nursing cleared pt for participation in physical therapy and present entire session.  Pt agreeable to PT session.     Exercises        Assessment/Plan    PT Assessment Patient needs continued PT services  PT Diagnosis Generalized weakness (Difficulty with transfers)   PT Problem List Decreased strength;Decreased activity tolerance;Decreased balance;Decreased mobility;Cardiopulmonary status limiting activity;Pain  PT Treatment Interventions DME instruction;Functional mobility training;Therapeutic activities;Therapeutic exercise;Balance training;Patient/family education;Wheelchair mobility training;Neuromuscular re-education   PT Goals (Current goals can be found in the Care Plan section) Acute Rehab PT Goals Patient Stated Goal: to get stronger PT Goal Formulation: With patient Time For Goal Achievement:  03/12/15 Potential to Achieve Goals: Fair    Frequency Min 2X/week   Barriers to discharge Decreased caregiver support      Co-evaluation               End of Session Equipment Utilized During Treatment: Oxygen Activity Tolerance: Patient limited by fatigue Patient left: in bed;with call bell/phone within reach;with family/visitor present Nurse Communication: Mobility status;Precautions         Time: 6144-3154 PT Time Calculation (min) (ACUTE ONLY): 25 min   Charges:   PT Evaluation $PT Eval Moderate Complexity: 1 Procedure     PT G CodesLeitha Bleak Mar 08, 2015, 5:34 PM Leitha Bleak, [place]

## 2015-02-26 NOTE — Evaluation (Addendum)
Clinical/Bedside Swallow Evaluation Patient Details  Name: Emily Kidd MRN: [id number] Date of Birth: [birth year]  Today's Date: 02/26/2015 Time: SLP Start Time (ACUTE ONLY): 1015 SLP Stop Time (ACUTE ONLY): 1115 SLP Time Calculation (min) (ACUTE ONLY): 60 min  Past Medical History:  Past Medical History  Diagnosis Date  . Hypertension   . COPD (chronic obstructive pulmonary disease) ([place])   . Pneumonia   . Sepsis ([place])   . Lung mass   . Diabetes mellitus without complication ([place])   . Dysrhythmia     TACHYCARDIA  . GERD (gastroesophageal reflux disease)   . Mitral regurgitation   . Carotid artery stenosis   . Muscle spasms of neck   . TIA (transient ischemic attack)   . Anxiety   . Hyperlipidemia   . Major depressive disorder ([place])   . Chronic pain syndrome   . Drug abuse in remission   . Vertigo   . Stable angina ([facility])   . Neuropathy ([place])   . Left-sided weakness     FROM TIA  . Coronary artery disease   . Shortness of breath dyspnea    Past Surgical History:  Past Surgical History  Procedure Laterality Date  . Endobronchial ultrasound N/A 11/24/2014    Procedure: ENDOBRONCHIAL ULTRASOUND;  Surgeon: Flora Lipps, MD;  Location: [facility] ORS;  Service: Cardiopulmonary;  Laterality: N/A;  . Above knee amputation Left   . Endobronchial ultrasound N/A 12/15/2014    Procedure: ENDOBRONCHIAL ULTRASOUND;  Surgeon: Flora Lipps, MD;  Location: [facility] ORS;  Service: Cardiopulmonary;  Laterality: N/A;  . Video assisted thoracoscopy Left 02/24/2015    Procedure: Preoperative bronchoscopy, left thoracoscopy and lung biopsy;  Surgeon: Nestor Lewandowsky, MD;  Location: [facility] ORS;  Service: Thoracic;  Laterality: Left;   HPI:  Pt is a 65 y.o. female with PMH including HTN, COPD, lung mass, spesis, DM, GERD, TIA w/ Left sided weakness, chronic pain, drug abuse in remission, anxiety, and multiple other medical issues who presents w/ known history of several week history hemoptysis with CT scan  suggestive interstitial process involving the left lung mostly the left upper lobe. Patient been treated with multiple antibiotics and continued to have the symptoms therefore cardiothoracic surgery took her to the operating room and did a left arthroscopy with biopsy of the left upper and left lower lobes. Patient has a chest tube in place. She is complaining of some pain at the site of the chest tube. She is also on a full face mask. This as a chest pain she is not complaining of any shortness of breath. Pt has had a cough and MD concerned wanting to r/o dysphagia. Pt is currently on a mech soft diet w/ thin liquids and tolerating this per NSG given assistance at meals w/ feeding. Pt is verbally conversive making her wants/needs known. Pt remains on HFNC for O2 support and appears fatigued w/ exertion.   Assessment / Plan / Recommendation Clinical Impression  Pt appeared to adequately tolerate trials of thin liquids and purees w/ few soft solid trials (accepted only by pt) w/ no overt s/s of aspiration noted; no decline in respiratory status or O2 sats during po intake, although, pt did exhibit "catchup breathing" if she took multiple sips - rest breaks were given b/t every 2-3 trials to lessen any exertion. Pt exhibited no gross oral phase deficits w/ po trials given. She assisted in holding cup to feed self. She declined po foods after only a few trials but appeared to enjoy drinking  liquids more easily. Pt appears at reduced risk for aspiration w/ mech soft diet currently following general aspiration precautions baseline to include small, single bites and sips, eating and drinking slowly to avoid increased WOB, and taking rest breaks when nec. to calm breathing. Rec. continue w/ current diet as ordered w/ precautions; moistening foods. Pt will need assistance w/ feeding. NSG updated.     Aspiration Risk   (reduced following aspiration precautions)    Diet Recommendation  Dys. 3 w/ thin liquids;  aspiration precautions; feeding assistance at meals. Rest breaks during meals as needed to lessen the exertion.  Medication Administration: Whole meds with puree    Other  Recommendations Recommended Consults:  (Dietician for supplements/assessment) Oral Care Recommendations: Oral care BID;Staff/trained caregiver to provide oral care   Follow up Recommendations   (TBD by PT)    Frequency and Duration min 2x/week  2 weeks       Prognosis Prognosis for Safe Diet Advancement: Good Barriers to Reach Goals:  (medical status)      Swallow Study   General Date of Onset: 02/24/15 HPI: Pt is a 65 y.o. female with PMH including HTN, COPD, lung mass, spesis, DM, GERD, TIA w/ Left sided weakness, chronic pain, drug abuse in remission, anxiety, and multiple other medical issues who presents w/ known history of several week history hemoptysis with CT scan suggestive interstitial process involving the left lung mostly the left upper lobe. Patient been treated with multiple antibiotics and continued to have the symptoms therefore cardiothoracic surgery took her to the operating room and did a left arthroscopy with biopsy of the left upper and left lower lobes. Patient has a chest tube in place. She is complaining of some pain at the site of the chest tube. She is also on a full face mask. This as a chest pain she is not complaining of any shortness of breath. Pt has had a cough and MD concerned wanting to r/o dysphagia. Pt is currently on a mech soft diet w/ thin liquids and tolerating this per NSG given assistance at meals w/ feeding. Pt is verbally conversive making her wants/needs known. Type of Study: Bedside Swallow Evaluation Previous Swallow Assessment: none indicated Diet Prior to this Study: Dysphagia 3 (soft);Thin liquids Temperature Spikes Noted: No (wbc elevated) Respiratory Status: Nasal cannula (pt w/ chest tube(L) baseline) History of Recent Intubation: No Behavior/Cognition:  Alert;Cooperative;Pleasant mood;Requires cueing (min.) Oral Cavity Assessment: Within Functional Limits Oral Care Completed by SLP: Yes Oral Cavity - Dentition: Missing dentition Vision:  (n/a) Self-Feeding Abilities: Total assist (LUE weakness; IVs) Patient Positioning: Upright in bed Baseline Vocal Quality: Normal;Low vocal intensity Volitional Cough: Strong Volitional Swallow: Able to elicit    Oral/Motor/Sensory Function Overall Oral Motor/Sensory Function: Within functional limits   Ice Chips Ice chips: Within functional limits Presentation: Spoon (fed; 3 trials)   Thin Liquid Thin Liquid: Within functional limits Presentation: Straw (assisted; ~5 ozs)    Nectar Thick Nectar Thick Liquid: Not tested   Honey Thick Honey Thick Liquid: Not tested   Puree Puree: Within functional limits Presentation: Spoon (fed; 2 trials)   Solid   GO   Solid: Within functional limits Presentation: Spoon (fed; 3 trials - refused further)        Orinda Kenner, MS, CCC-SLP  Watson,Katherine 02/26/2015,3:56 PM

## 2015-02-26 NOTE — Progress Notes (Signed)
ANTIBIOTIC CONSULT NOTE - Follow-up  Pharmacy Consult for meropenem (day 2) Indication: r/o pneumonia  Allergies  Allergen Reactions  . Azithromycin   . Penicillins Rash    Patient Measurements: Height: '5\' 4"'$  (162.6 cm) Weight: 162 lb (73.483 kg) IBW/kg (Calculated) : 54.7   Vital Signs: Temp: 97.7 F (36.5 C) (01/26 1200) Temp Source: Oral (01/26 1200) BP: 138/127 mmHg (01/26 1300) Pulse Rate: 123 (01/26 1300) Intake/Output from previous day: 01/25 0701 - 01/26 0700 In: 1810 [I.V.:1110; IV Piggyback:700] Out: 7096 [Urine:3575; Chest Tube:280] Intake/Output from this shift: Total I/O In: 400 [I.V.:250; IV Piggyback:150] Out: 40 [Chest Tube:40]  Labs:  Recent Labs  02/24/15 0947 02/25/15 0523 02/26/15 1019  WBC  --  19.0*  --   HGB 11.6* 11.8*  --   PLT  --  427  --   CREATININE  --  0.57 0.58   Estimated Creatinine Clearance: 69.8 mL/min (by C-G formula based on Cr of 0.58). No results for input(s): VANCOTROUGH, VANCOPEAK, VANCORANDOM, GENTTROUGH, GENTPEAK, GENTRANDOM, TOBRATROUGH, TOBRAPEAK, TOBRARND, AMIKACINPEAK, AMIKACINTROU, AMIKACIN in the last 72 hours.   Microbiology: Recent Results (from the past 720 hour(s))  Surgical pcr screen     Status: None   Collection Time: 02/18/15  1:23 PM  Result Value Ref Range Status   MRSA, PCR NEGATIVE NEGATIVE Final   Staphylococcus aureus NEGATIVE NEGATIVE Final    Comment:        The Xpert SA Assay (FDA approved for NASAL specimens in patients over 42 years of age), is one component of a comprehensive surveillance program.  Test performance has been validated by [facility] for patients greater than or equal to 22 year old. It is not intended to diagnose infection nor to guide or monitor treatment.   Anaerobic culture     Status: None (Preliminary result)   Collection Time: 02/24/15 12:04 PM  Result Value Ref Range Status   Specimen Description LUNG  Final   Special Requests NONE  Final   Culture NO  ANAEROBES ISOLATED  Final   Report Status PENDING  Incomplete  Fungus Culture with Smear     Status: None (Preliminary result)   Collection Time: 02/24/15 12:04 PM  Result Value Ref Range Status   Specimen Description LUNG  Final   Special Requests NONE  Final   Culture NO FUNGUS ISOLATED AFTER 2 DAYS  Final   Report Status PENDING  Incomplete  Tissue culture     Status: None (Preliminary result)   Collection Time: 02/24/15 12:04 PM  Result Value Ref Range Status   Specimen Description LUNG  Final   Special Requests NONE  Final   Gram Stain MODERATE WBC SEEN NO ORGANISMS SEEN   Final   Culture NO GROWTH 2 DAYS  Final   Report Status PENDING  Incomplete    Assessment:   Pharmacy consulted to dose meropenem for 65 yo female being treated for possible PNA admitted s/p left thoracoscopy. Patient on day 2 of meropenem 1g IV Q8hr. Patient previously started on vancomycin '750mg'$  IV Q12hr x 2 days.  Plan:  Will continue patient on meropenem 1g IV Q8hr.    Pharmacy will continue to monitor and adjust per consult.    Synda Bagent L 02/26/2015,3:28 PM

## 2015-02-26 NOTE — Progress Notes (Signed)
[facility] Post-Op Note  Patient ID: Emily Kidd, female   DOB: 11-13-1950, 65 y.o.   MRN: [id number]  HISTORY: She does not complain of any shortness of breath today. She has no pain today. Her only complaint is that she has some pain in her left arm where she rolled over on it.   Filed Vitals:   02/26/15 0800 02/26/15 0900  BP: 104/89 124/85  Pulse: 98   Temp: 98.3 F (36.8 C)   Resp: 25 22     EXAM: Resp: Lungs are clear on the right. The left lung is markedly diminished..  No respiratory distress, normal effort. Heart:  Regular without murmurs Skin: Skin is warm and dry. No rash noted. No diaphoretic. No erythema. No pallor.  Psychiatric: Normal mood and affect. Normal behavior. Judgment and thought content normal.    ASSESSMENT: Overall I believe that she is doing reasonably well. We will try to wean her oxygen therapy. Will get a chest x-ray today. I will check on the pathology.   PLAN:   Wean oxygen. Get chest x-ray. Check on pathology.    Nestor Lewandowsky, MD

## 2015-02-26 NOTE — Progress Notes (Signed)
Patient ID: Emily Kidd, female   DOB: 09-Mar-1950, 65 y.o.   MRN: [id number] [facility] PROGRESS NOTE  Emily Kidd SWF:[id number] DOB: 08/02/1950 DOA: 02/24/2015 PCP: [employer]  HPI/Subjective: Currently on high flow nasal cannula , breathing is improved     Objective: Filed Vitals:   02/26/15 0800 02/26/15 0900  BP: 104/89 124/85  Pulse: 98   Temp: 98.3 F (36.8 C)   Resp: 25 22    Filed Weights   02/24/15 0909  Weight: 73.483 kg (162 lb)    ROS: Review of Systems  Constitutional: Negative for fever and chills.  Eyes: Negative for blurred vision.  Respiratory: Positive for cough and shortness of breath.   Cardiovascular: Positive for chest pain.  Gastrointestinal: Negative for nausea, vomiting, abdominal pain, diarrhea and constipation.  Genitourinary: Negative for dysuria.  Musculoskeletal: Negative for joint pain.  Neurological: Negative for dizziness and headaches.   Exam: Physical Exam  Constitutional: She is oriented to person, place, and time.  HENT:  Nose: No mucosal edema.  Mouth/Throat: No oropharyngeal exudate or posterior oropharyngeal edema.  Eyes: Conjunctivae, EOM and lids are normal. Pupils are equal, round, and reactive to light.  Neck: No JVD present. Carotid bruit is not present. No edema present. No thyroid mass and no thyromegaly present.  Cardiovascular: S1 normal and S2 normal.  Exam reveals no gallop.   Murmur heard.  Systolic murmur is present with a grade of 3/6  Pulses:      Dorsalis pedis pulses are 2+ on the right side, and 2+ on the left side.  Respiratory: No respiratory distress. She has decreased breath sounds in the left lung occasional rhonchi GI: Soft. Bowel sounds are normal. There is no tenderness.  Musculoskeletal:       Right ankle: She exhibits swelling.  Lymphadenopathy:    She has no cervical adenopathy.  Neurological: She is alert and oriented to person, place, and time.  Left-sided  paralysis  Skin: Skin is warm. No rash noted. Nails show no clubbing.  Psychiatric: She has a normal mood and affect.      Data Reviewed: Basic Metabolic Panel:  Recent Labs Lab 02/24/15 0947 02/25/15 0523  NA 143 152*  K 3.6 4.1  CL  --  117*  CO2  --  28  GLUCOSE 102* 151*  BUN  --  <5*  CREATININE  --  0.57  CALCIUM  --  8.4*   Liver Function Tests:  Recent Labs Lab 02/25/15 0523  AST 17  ALT 13*  ALKPHOS 101  BILITOT 0.8  PROT 7.0  ALBUMIN 3.1*   CBC:  Recent Labs Lab 02/24/15 0947 02/25/15 0523  WBC  --  19.0*  HGB 11.6* 11.8*  HCT 34.0* 37.5  MCV  --  81.9  PLT  --  427   CBG:  Recent Labs Lab 02/25/15 1151 02/25/15 1711 02/25/15 2345 02/26/15 0627 02/26/15 0731  GLUCAP 123* 119* 108* 119* 140*    Recent Results (from the past 240 hour(s))  Surgical pcr screen     Status: None   Collection Time: 02/18/15  1:23 PM  Result Value Ref Range Status   MRSA, PCR NEGATIVE NEGATIVE Final   Staphylococcus aureus NEGATIVE NEGATIVE Final    Comment:        The Xpert SA Assay (FDA approved for NASAL specimens in patients over 94 years of age), is one component of a comprehensive surveillance program.  Test performance has been validated by  [place] for patients greater than or equal to 17 year old. It is not intended to diagnose infection nor to guide or monitor treatment.   Anaerobic culture     Status: None (Preliminary result)   Collection Time: 02/24/15 12:04 PM  Result Value Ref Range Status   Specimen Description LUNG  Final   Special Requests NONE  Final   Culture NO ANAEROBES ISOLATED  Final   Report Status PENDING  Incomplete  Fungus Culture with Smear     Status: None (Preliminary result)   Collection Time: 02/24/15 12:04 PM  Result Value Ref Range Status   Specimen Description LUNG  Final   Special Requests NONE  Final   Culture NO FUNGUS ISOLATED AFTER 2 DAYS  Final   Report Status PENDING  Incomplete  Tissue culture      Status: None (Preliminary result)   Collection Time: 02/24/15 12:04 PM  Result Value Ref Range Status   Specimen Description LUNG  Final   Special Requests NONE  Final   Gram Stain PENDING  Incomplete   Culture NO GROWTH 2 DAYS  Final   Report Status PENDING  Incomplete     Studies: Dg Chest Port 1 View  02/26/2015  CLINICAL DATA:  Postop check. EXAM: PORTABLE CHEST 1 VIEW COMPARISON:  February 25, 2015 FINDINGS: The left chest tube remains. No pneumothorax. Diffuse opacification of left hemithorax is stable. Mild opacity in the right base is probably atelectasis. No other acute abnormalities. IMPRESSION: No interval change. Persistent opacity in the left lung diffusely and the right base. Electronically Signed   By: Dorise Bullion III M.D   On: 02/26/2015 10:54   Dg Chest Port 1 View  02/25/2015  CLINICAL DATA:  Acute onset of hypoxemia. Recent left-sided thoracoscopy. Initial encounter. EXAM: PORTABLE CHEST 1 VIEW COMPARISON:  Chest radiograph performed 02/24/2015, and CTA of the chest performed 12/07/2014 FINDINGS: The lungs are hypoexpanded. Diffuse left-sided and more mild right basilar airspace opacities are again seen, perhaps slightly improved from the prior study. This was thought to reflect pneumonia on prior CTA. A left apical chest tube is unchanged in position. No definite pleural effusion or pneumothorax is seen. The cardiomediastinal silhouette is normal in size. No acute osseous abnormalities are identified. IMPRESSION: Lungs hypoexpanded. Diffuse left-sided and more mild right basilar airspace opacities again seen, perhaps slightly improved from the prior study. Left apical chest tube unchanged in position. Electronically Signed   By: Garald Balding M.D.   On: 02/25/2015 03:58   Dg Chest Port 1 View  02/24/2015  CLINICAL DATA:  Postoperative evaluation. EXAM: PORTABLE CHEST 1 VIEW COMPARISON:  Chest radiograph 01/15/2015. FINDINGS: Left-sided chest tubes in place. There is  complete opacification left hemi thorax. The cardiac and mediastinal contours are largely obscured. Heterogeneous opacities right mid and lower lung. Low lung volumes. No definite pneumothorax. Can't exclude left-sided pleural fluid. IMPRESSION: Complete opacification of the left hemi thorax is nonspecific and may be secondary to infection, edema and/or hemorrhage. Left chest tubes in place. Heterogeneous opacities right lung base may represent atelectasis, infection and/or aspiration. Electronically Signed   By: Lovey Newcomer M.D.   On: 02/24/2015 14:03    Scheduled Meds: . antiseptic oral rinse  7 mL Mouth Rinse 10 times per day  . atorvastatin  40 mg Oral QHS  . bisacodyl  10 mg Oral Daily  . carvedilol  3.125 mg Oral BID  . chlorhexidine gluconate  15 mL Mouth Rinse BID  . docusate sodium  100 mg Oral BID  . insulin aspart  0-9 Units Subcutaneous TID WC  . ipratropium-albuterol  3 mL Nebulization Q6H  . lidocaine  1 patch Transdermal Q24H  . LORazepam  1 mg Oral QHS  . meropenem (MERREM) IV  1 g Intravenous 3 times per day  . oxybutynin  5 mg Oral QHS  . pantoprazole  40 mg Oral BH-q7a  . pregabalin  200 mg Oral BID  . traZODone  100 mg Oral QHS  . vancomycin  750 mg Intravenous Q12H  . venlafaxine XR  75 mg Oral Daily   Continuous Infusions: . dextrose 50 mL/hr at 02/26/15 0900    Assessment/Plan:  1. Acute respiratory failure with hypoxia. Continue supportive care with oxygen therapy 2. Chest pain status post thoracoscopy and lung biopsy for pulmonary hemorrhage. When necessary IV morphine and oral oxycodone. 3. Suspected pneumonia vancomycin and meropenem. Continue current antibiotics until respiratory status improved 4. Essential hypertension on Coreg 5. Type 2 diabetes mellitus on sliding scale blood glucose stable 6. History of CVA with left-sided paralysis- off anticoagulation at this point. Resume once okay per surgery 7. Hyperlipidemia unspecified on atorvastatin  Code  Status:     Code Status Orders        Start     Ordered   02/24/15 1530  Full code   Continuous     02/24/15 1529    Code Status History    Date Active Date Inactive Code Status Order ID Comments User Context   01/04/2015  8:57 AM 01/06/2015  8:46 PM Full Code [id number]  Harrie Foreman, MD Inpatient   12/15/2014  7:01 PM 12/16/2014  4:16 PM Full Code [id number]  Demetrios Loll, MD Inpatient   11/16/2014  5:48 AM 11/21/2014  4:42 PM Full Code [id number]  Verlin Dike, RN Inpatient   11/16/2014  4:31 AM 11/16/2014  5:47 AM DNR [id number]  Quintella Baton, MD Inpatient    Advance Directive Documentation        Most Recent Value   Type of Advance Directive  Healthcare Power of Attorney, Living will   Pre-existing out of facility DNR order (yellow form or pink MOST form)     "MOST" Form in Place?       Disposition Plan: to be determined  Consultants:  Pulmonary  Cardiothoracic surgery  Procedures:  Thoracoscopy lung biopsy and chest tube  Antibiotics:  Vancomycin  Meropenem  Time spent: 30 minutes, patient is critically ill  Fidel Caggiano, [facility]

## 2015-02-27 ENCOUNTER — Inpatient Hospital Stay: Payer: Medicare (Managed Care)

## 2015-02-27 DIAGNOSIS — R0602 Shortness of breath: Secondary | ICD-10-CM

## 2015-02-27 DIAGNOSIS — E785 Hyperlipidemia, unspecified: Secondary | ICD-10-CM

## 2015-02-27 DIAGNOSIS — Z8673 Personal history of transient ischemic attack (TIA), and cerebral infarction without residual deficits: Secondary | ICD-10-CM

## 2015-02-27 DIAGNOSIS — I2510 Atherosclerotic heart disease of native coronary artery without angina pectoris: Secondary | ICD-10-CM

## 2015-02-27 DIAGNOSIS — Z9981 Dependence on supplemental oxygen: Secondary | ICD-10-CM

## 2015-02-27 DIAGNOSIS — E119 Type 2 diabetes mellitus without complications: Secondary | ICD-10-CM

## 2015-02-27 DIAGNOSIS — R5381 Other malaise: Secondary | ICD-10-CM

## 2015-02-27 DIAGNOSIS — R5383 Other fatigue: Secondary | ICD-10-CM

## 2015-02-27 DIAGNOSIS — R531 Weakness: Secondary | ICD-10-CM

## 2015-02-27 DIAGNOSIS — C3492 Malignant neoplasm of unspecified part of left bronchus or lung: Secondary | ICD-10-CM

## 2015-02-27 DIAGNOSIS — R042 Hemoptysis: Secondary | ICD-10-CM

## 2015-02-27 DIAGNOSIS — J449 Chronic obstructive pulmonary disease, unspecified: Secondary | ICD-10-CM

## 2015-02-27 LAB — GLUCOSE, CAPILLARY
GLUCOSE-CAPILLARY: 141 mg/dL — AB (ref 65–99)
GLUCOSE-CAPILLARY: 126 mg/dL — AB (ref 65–99)
Glucose-Capillary: 125 mg/dL — ABNORMAL HIGH (ref 65–99)

## 2015-02-27 MED ORDER — IPRATROPIUM-ALBUTEROL 0.5-2.5 (3) MG/3ML IN SOLN: 3.0000 mL | Freq: Once | RESPIRATORY_TRACT | Status: DC

## 2015-02-27 MED ORDER — BUDESONIDE 0.5 MG/2ML IN SUSP
0.5000 mg | Freq: Two times a day (BID) | RESPIRATORY_TRACT | Status: DC
  Administered 2015-02-27 – 2015-03-04 (×10): 0.5 mg via RESPIRATORY_TRACT
  Filled 2015-02-27 (×10): qty 2

## 2015-02-27 MED ORDER — IPRATROPIUM-ALBUTEROL 0.5-2.5 (3) MG/3ML IN SOLN
3.0000 mL | RESPIRATORY_TRACT | Status: DC
  Administered 2015-02-27 – 2015-02-28 (×5): 3 mL via RESPIRATORY_TRACT
  Filled 2015-02-27 (×5): qty 3

## 2015-02-27 NOTE — Plan of Care (Signed)
Problem: SLP Dysphagia Goals Goal: Misc Dysphagia Goal Pt will safely tolerate po diet of least restrictive consistency w/ no overt s/s of aspiration noted by Staff/pt/family x3 sessions.    

## 2015-02-27 NOTE — Progress Notes (Signed)
[facility] Post-Op [facility]  Patient ID: VERLEEN STUCKEY, female   DOB: 11-30-1950, 65 y.o.   MRN: [id number]  HISTORY: She is resting more comfortably this morning.  She does not complain of shortness of breath or arm pain. She is aware of her diagnosis and wished to know the plans for treating her cancer.     Filed Vitals:   02/26/15 2300 02/27/15 0000  BP: 86/60 91/55  Pulse: 118 110  Temp:  97.8 F (36.6 C)  Resp: 29 14     EXAM: Resp: Lungs are clear on right but markedly diminished on the left.  No respiratory distress, normal effort. Heart:  Regular without murmurs Psychiatric: Normal mood and affect. Normal behavior. Judgment and thought content normal.    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   PLAN:   We will ask for Oncology to see.  Try to wean oxygen and encourage independence with oral intake and physical therapy.  Can probably discontinue one of her chest tubes today and one tomorrow.     Nestor Lewandowsky, MD

## 2015-02-27 NOTE — Consult Note (Signed)
[place]  [phone]) [phone] [phone]) [phone]  ID: ALLISEN PIDGEON OB: 07-06-1950  MR#: [id number]  ZMO#:[id number]  Patient Care Team: [facility] as PCP - General  CHIEF COMPLAINT: Adenocarcinoma the lung.  INTERVAL HISTORY: Patient is a 65 year old female who was recently admitted with a several week history of hemoptysis and respiratory failure. CT scan was suggestive of a significant interstitial process of the left lung. Several rounds of antibiotics were unhelpful. She subsequently underwent thoracotomy and biopsies revealed adenocarcinoma. Currently, she is requiring high flow oxygen via nasal cannula and continues to have shortness of breath. She has no neurologic complaints. She denies any fevers. She denies any chest pain. She denies any nausea, vomiting, constipation, or diarrhea. Patient feels generally terrible, but offers no further specific complaints.   REVIEW OF SYSTEMS:   Review of Systems  Constitutional: Positive for malaise/fatigue. Negative for fever.  Respiratory: Positive for cough, hemoptysis and shortness of breath.   Cardiovascular: Negative for chest pain.  Gastrointestinal: Negative.   Genitourinary: Negative.   Musculoskeletal: Negative.   Neurological: Positive for weakness.    As per HPI. Otherwise, a complete review of systems is negatve.  PAST MEDICAL HISTORY: Past Medical History  Diagnosis Date  . Hypertension   . COPD (chronic obstructive pulmonary disease) ([place])   . Pneumonia   . Sepsis ([place])   . Lung mass   . Diabetes mellitus without complication ([place])   . Dysrhythmia     TACHYCARDIA  . GERD (gastroesophageal reflux disease)   . Mitral regurgitation   . Carotid artery stenosis   . Muscle spasms of neck   . TIA (transient ischemic attack)   . Anxiety   . Hyperlipidemia   . Major depressive disorder ([place])   . Chronic pain syndrome   . Drug abuse in remission   . Vertigo   . Stable angina  ([facility])   . Neuropathy ([place])   . Left-sided weakness     FROM TIA  . Coronary artery disease   . Shortness of breath dyspnea     PAST SURGICAL HISTORY: Past Surgical History  Procedure Laterality Date  . Endobronchial ultrasound N/A 11/24/2014    Procedure: ENDOBRONCHIAL ULTRASOUND;  Surgeon: Flora Lipps, MD;  Location: [facility] ORS;  Service: Cardiopulmonary;  Laterality: N/A;  . Above knee amputation Left   . Endobronchial ultrasound N/A 12/15/2014    Procedure: ENDOBRONCHIAL ULTRASOUND;  Surgeon: Flora Lipps, MD;  Location: [facility] ORS;  Service: Cardiopulmonary;  Laterality: N/A;  . Video assisted thoracoscopy Left 02/24/2015    Procedure: Preoperative bronchoscopy, left thoracoscopy and lung biopsy;  Surgeon: Nestor Lewandowsky, MD;  Location: [facility] ORS;  Service: Thoracic;  Laterality: Left;    FAMILY HISTORY Family History  Problem Relation Age of Onset  . Heart attack Father   . Stroke Father        ADVANCED DIRECTIVES:    HEALTH MAINTENANCE: Social History  Substance Use Topics  . Smoking status: Former Smoker    Quit date: 09/30/2008  . Smokeless tobacco: None  . Alcohol Use: No     Colonoscopy:  PAP:  Bone density:  Lipid panel:  Allergies  Allergen Reactions  . Azithromycin   . Penicillins Rash    Current Facility-Administered Medications  Medication Dose Route Frequency Provider Last Rate Last Dose  . acetaminophen (TYLENOL) tablet 650 mg  650 mg Oral Q6H PRN Loletha Grayer, MD      . albuterol (PROVENTIL) (2.5 MG/3ML) 0.083% nebulizer solution 2.5 mg  2.5 mg Nebulization Q4H PRN Nestor Lewandowsky, MD   2.5 mg at 02/25/15 0314  . antiseptic oral rinse solution (CORINZ)  7 mL Mouth Rinse 10 times per day Loletha Grayer, MD   7 mL at 02/27/15 1449  . atorvastatin (LIPITOR) tablet 40 mg  40 mg Oral QHS Nestor Lewandowsky, MD   40 mg at 02/26/15 2056  . bisacodyl (DULCOLAX) EC tablet 10 mg  10 mg Oral Daily Nestor Lewandowsky, MD   10 mg at 02/27/15 1126  . budesonide (PULMICORT)  nebulizer solution 0.5 mg  0.5 mg Nebulization BID Flora Lipps, MD      . carvedilol (COREG) tablet 3.125 mg  3.125 mg Oral BID Nestor Lewandowsky, MD   Stopped at 02/27/15 1127  . chlorhexidine gluconate (PERIDEX) 0.12 % solution 15 mL  15 mL Mouth Rinse BID Loletha Grayer, MD   15 mL at 02/27/15 0844  . dextrose 5 % solution   Intravenous Continuous Loletha Grayer, MD 50 mL/hr at 02/27/15 0527    . docusate sodium (COLACE) capsule 100 mg  100 mg Oral BID Loletha Grayer, MD   100 mg at 02/26/15 2102  . feeding supplement (ENSURE ENLIVE) (ENSURE ENLIVE) liquid 237 mL  237 mL Oral TID Vishal Mungal, MD   237 mL at 02/27/15 1213  . HYDROcodone-acetaminophen (NORCO/VICODIN) 5-325 MG per tablet 1-2 tablet  1-2 tablet Oral Q4H PRN Nestor Lewandowsky, MD   1 tablet at 02/27/15 0836  . insulin aspart (novoLOG) injection 0-9 Units  0-9 Units Subcutaneous TID WC Dustin Flock, MD   1 Units at 02/27/15 1144  . ipratropium-albuterol (DUONEB) 0.5-2.5 (3) MG/3ML nebulizer solution 3 mL  3 mL Nebulization Once Martha Clan, MD   3 mL at 02/27/15 0241  . ipratropium-albuterol (DUONEB) 0.5-2.5 (3) MG/3ML nebulizer solution 3 mL  3 mL Nebulization Q4H PRN Vishal Mungal, MD      . ipratropium-albuterol (DUONEB) 0.5-2.5 (3) MG/3ML nebulizer solution 3 mL  3 mL Nebulization Q4H Flora Lipps, MD   3 mL at 02/27/15 1526  . lidocaine (LIDODERM) 5 % 1 patch  1 patch Transdermal Q24H Vishal Mungal, MD   1 patch at 02/27/15 1345  . LORazepam (ATIVAN) tablet 1 mg  1 mg Oral QHS Vishal Mungal, MD   1 mg at 02/26/15 2056  . meropenem (MERREM) 1 g in sodium chloride 0.9 % 100 mL IVPB  1 g Intravenous 3 times per day Flora Lipps, MD   1 g at 02/27/15 1345  . morphine 2 MG/ML injection 1-2 mg  1-2 mg Intravenous Q1H PRN Nestor Lewandowsky, MD   2 mg at 02/26/15 1606  . nitroGLYCERIN (NITROSTAT) SL tablet 0.4 mg  0.4 mg Sublingual Q5 min PRN Nestor Lewandowsky, MD      . ondansetron [facility]) injection 4 mg  4 mg Intravenous Q6H PRN Nestor Lewandowsky, MD   4  mg at 02/25/15 0644  . oxybutynin (DITROPAN-XL) 24 hr tablet 5 mg  5 mg Oral QHS Nestor Lewandowsky, MD   5 mg at 02/26/15 2057  . oxyCODONE (Oxy IR/ROXICODONE) immediate release tablet 5 mg  5 mg Oral Q4H PRN Loletha Grayer, MD      . pantoprazole (PROTONIX) EC tablet 40 mg  40 mg Oral Sammuel Cooper, MD   40 mg at 02/27/15 0836  . polyethylene glycol (MIRALAX / GLYCOLAX) packet 17 g  17 g Oral Daily PRN Loletha Grayer, MD      . pregabalin (LYRICA) capsule 200 mg  200 mg Oral  BID Loletha Grayer, MD   200 mg at 02/27/15 1145  . traZODone (DESYREL) tablet 100 mg  100 mg Oral QHS Nestor Lewandowsky, MD   100 mg at 02/26/15 2103  . venlafaxine XR (EFFEXOR-XR) 24 hr capsule 75 mg  75 mg Oral Daily Nestor Lewandowsky, MD   75 mg at 02/27/15 1148   Facility-Administered Medications Ordered in Other Encounters  Medication Dose Route Frequency Provider Last Rate Last Dose  . lidocaine (XYLOCAINE) 2 % jelly 1 application  1 application Topical Once Flora Lipps, MD        OBJECTIVE: Filed Vitals:   02/27/15 1100 02/27/15 1200  BP: 84/63 119/66  Pulse: 113 115  Temp:    Resp: 17 26     Body mass index is 27.79 kg/(m^2).    ECOG FS:4 - Bedbound  General: Ill-appearing, mild respiratory distress. Eyes: Pink conjunctiva, anicteric sclera. HEENT: High flow nasal cannula in place. Lungs: Minimal breath sounds on left. Heart: Regular rate and rhythm. No rubs, murmurs, or gallops. Abdomen: Soft, nontender, nondistended. No organomegaly noted, normoactive bowel sounds. Musculoskeletal: Left BKA.  Neuro: Alert, answering all questions appropriately. Cranial nerves grossly intact. Skin: No rashes or petechiae noted. Psych: Normal affect.    LAB RESULTS:  Lab Results  Component Value Date   NA 141 02/26/2015   K 4.2 02/26/2015   CL 110 02/26/2015   CO2 22 02/26/2015   GLUCOSE 152* 02/26/2015   BUN 6 02/26/2015   CREATININE 0.62 02/26/2015   CALCIUM 8.2* 02/26/2015   PROT 7.0 02/25/2015   ALBUMIN  3.1* 02/25/2015   AST 17 02/25/2015   ALT 13* 02/25/2015   ALKPHOS 101 02/25/2015   BILITOT 0.8 02/25/2015   GFRNONAA >60 02/26/2015   GFRAA >60 02/26/2015    Lab Results  Component Value Date   WBC 17.4* 02/26/2015   NEUTROABS 5.0 01/15/2015   HGB 11.7* 02/26/2015   HCT 38.4 02/26/2015   MCV 82.5 02/26/2015   PLT 319 02/26/2015     STUDIES: Dg Chest Port 1 View  02/27/2015  CLINICAL DATA:  Postop check. EXAM: PORTABLE CHEST 1 VIEW COMPARISON:  02/26/2015 FINDINGS: The patient has taken a slightly greater inspiration than on the prior study. Left-sided chest tubes remain in place. Dense consolidation remains throughout the left lung, not significantly changed. Right basilar airspace opacities are also unchanged. No definite pneumothorax or large pleural effusion is identified. The cardiac silhouette remains largely obscured. IMPRESSION: Unchanged dense left lung consolidation and mild right basilar opacity. Electronically Signed   By: Logan Bores M.D.   On: 02/27/2015 07:39   Dg Chest Port 1 View  02/26/2015  CLINICAL DATA:  Postop check. EXAM: PORTABLE CHEST 1 VIEW COMPARISON:  February 25, 2015 FINDINGS: The left chest tube remains. No pneumothorax. Diffuse opacification of left hemithorax is stable. Mild opacity in the right base is probably atelectasis. No other acute abnormalities. IMPRESSION: No interval change. Persistent opacity in the left lung diffusely and the right base. Electronically Signed   By: Dorise Bullion III M.D   On: 02/26/2015 10:54   Dg Chest Port 1 View  02/25/2015  CLINICAL DATA:  Acute onset of hypoxemia. Recent left-sided thoracoscopy. Initial encounter. EXAM: PORTABLE CHEST 1 VIEW COMPARISON:  Chest radiograph performed 02/24/2015, and CTA of the chest performed 12/07/2014 FINDINGS: The lungs are hypoexpanded. Diffuse left-sided and more mild right basilar airspace opacities are again seen, perhaps slightly improved from the prior study. This was thought to  reflect pneumonia on prior  CTA. A left apical chest tube is unchanged in position. No definite pleural effusion or pneumothorax is seen. The cardiomediastinal silhouette is normal in size. No acute osseous abnormalities are identified. IMPRESSION: Lungs hypoexpanded. Diffuse left-sided and more mild right basilar airspace opacities again seen, perhaps slightly improved from the prior study. Left apical chest tube unchanged in position. Electronically Signed   By: Garald Balding M.D.   On: 02/25/2015 03:58   Dg Chest Port 1 View  02/24/2015  CLINICAL DATA:  Postoperative evaluation. EXAM: PORTABLE CHEST 1 VIEW COMPARISON:  Chest radiograph 01/15/2015. FINDINGS: Left-sided chest tubes in place. There is complete opacification left hemi thorax. The cardiac and mediastinal contours are largely obscured. Heterogeneous opacities right mid and lower lung. Low lung volumes. No definite pneumothorax. Can't exclude left-sided pleural fluid. IMPRESSION: Complete opacification of the left hemi thorax is nonspecific and may be secondary to infection, edema and/or hemorrhage. Left chest tubes in place. Heterogeneous opacities right lung base may represent atelectasis, infection and/or aspiration. Electronically Signed   By: Lovey Newcomer M.D.   On: 02/24/2015 14:03    ASSESSMENT: Adenocarcinoma the lung.  PLAN:    1. Lung cancer: Patient has stated clearly that she does not want any chemotherapy. Given her performance status and multiple comorbid conditions, chemotherapy would likely be detrimental anyway. Will send pathology for EGFR mutation. If positive, patient would consider taking oral Tarceva. Consider palliative care consult. No further intervention or imaging needed. Will follow from a distance until EGFR results are finalized.  Appreciate consult.  Lloyd Huger, MD   02/27/2015 3:28 PM

## 2015-02-27 NOTE — Progress Notes (Addendum)
Patient ID: Emily Kidd, female   DOB: 1950/03/29, 65 y.o.   MRN: [id number] [facility] PROGRESS NOTE  Emily Kidd [name] DOB: [birth year] DOA: 02/24/2015 PCP: [place]  HPI/Subjective: Continues to require high flow oxygen. Chest pain improved Pathology from the procedure consistent with adenocarcinoma     Objective: Filed Vitals:   02/27/15 0500 02/27/15 0700  BP: 97/56 132/80  Pulse: 103 119  Temp: 98.4 F (36.9 C) 98.3 F (36.8 C)  Resp: 20 23    Filed Weights   02/24/15 0909  Weight: 73.483 kg (162 lb)    ROS: Review of Systems  Constitutional: Negative for fever and chills.  Eyes: Negative for blurred vision.  Respiratory: Positive for cough and shortness of breath.   Cardiovascular: Positive for chest pain.  Gastrointestinal: Negative for nausea, vomiting, abdominal pain, diarrhea and constipation.  Genitourinary: Negative for dysuria.  Musculoskeletal: Negative for joint pain.  Neurological: Negative for dizziness and headaches.   Exam: Physical Exam  Constitutional: She is oriented to person, place, and time.  HENT:  Nose: No mucosal edema.  Mouth/Throat: No oropharyngeal exudate or posterior oropharyngeal edema.  Eyes: Conjunctivae, EOM and lids are normal. Pupils are equal, round, and reactive to light.  Neck: No JVD present. Carotid bruit is not present. No edema present. No thyroid mass and no thyromegaly present.  Cardiovascular: S1 normal and S2 normal.  Exam reveals no gallop.   Murmur heard.  Systolic murmur is present with a grade of 3/6  Pulses:      Dorsalis pedis pulses are 2+ on the right side, and 2+ on the left side.  Respiratory: . She has decreased breath sounds in the left lung occasional rhonchi GI: Soft. Bowel sounds are normal. There is no tenderness.  Musculoskeletal:       Right ankle: She exhibits swelling.  Lymphadenopathy:    She has no cervical adenopathy.  Neurological: She is alert  and oriented to person, place, and time.  Left-sided paralysis  Skin: Skin is warm. No rash noted. Nails show no clubbing.  Psychiatric: She has a normal mood and affect.      Data Reviewed: Basic Metabolic Panel:  Recent Labs Lab 02/24/15 0947 02/25/15 0523 02/26/15 1019 02/26/15 2039  NA 143 152* 143 141  K 3.6 4.1 3.7 4.2  CL  --  117* 111 110  CO2  --  '28 26 22  '$ GLUCOSE 102* 151* 166* 152*  BUN  --  <5* 6 6  CREATININE  --  0.57 0.58 0.62  CALCIUM  --  8.4* 8.2* 8.2*   Liver Function Tests:  Recent Labs Lab 02/25/15 0523  AST 17  ALT 13*  ALKPHOS 101  BILITOT 0.8  PROT 7.0  ALBUMIN 3.1*   CBC:  Recent Labs Lab 02/24/15 0947 02/25/15 0523 02/26/15 2039  WBC  --  19.0* 17.4*  HGB 11.6* 11.8* 11.7*  HCT 34.0* 37.5 38.4  MCV  --  81.9 82.5  PLT  --  427 319   CBG:  Recent Labs Lab 02/26/15 0731 02/26/15 1153 02/26/15 1802 02/27/15 0820 02/27/15 1138  GLUCAP 140* 123* 126* 125* 141*    Recent Results (from the past 240 hour(s))  Surgical pcr screen     Status: None   Collection Time: 02/18/15  1:23 PM  Result Value Ref Range Status   MRSA, PCR NEGATIVE NEGATIVE Final   Staphylococcus aureus NEGATIVE NEGATIVE Final    Comment:  The Xpert SA Assay (FDA approved for NASAL specimens in patients over 23 years of age), is one component of a comprehensive surveillance program.  Test performance has been validated by [facility] for patients greater than or equal to 63 year old. It is not intended to diagnose infection nor to guide or monitor treatment.   Anaerobic culture     Status: None (Preliminary result)   Collection Time: 02/24/15 12:04 PM  Result Value Ref Range Status   Specimen Description LUNG  Final   Special Requests NONE  Final   Culture NO ANAEROBES ISOLATED  Final   Report Status PENDING  Incomplete  Fungus Culture with Smear     Status: None (Preliminary result)   Collection Time: 02/24/15 12:04 PM  Result Value  Ref Range Status   Specimen Description LUNG  Final   Special Requests NONE  Final   Culture NO GROWTH 3 DAYS  Final   Report Status PENDING  Incomplete  Tissue culture     Status: None (Preliminary result)   Collection Time: 02/24/15 12:04 PM  Result Value Ref Range Status   Specimen Description LUNG  Final   Special Requests NONE  Final   Gram Stain MODERATE WBC SEEN NO ORGANISMS SEEN   Final   Culture NO GROWTH 2 DAYS  Final   Report Status PENDING  Incomplete     Studies: Dg Chest Port 1 View  02/27/2015  CLINICAL DATA:  Postop check. EXAM: PORTABLE CHEST 1 VIEW COMPARISON:  02/26/2015 FINDINGS: The patient has taken a slightly greater inspiration than on the prior study. Left-sided chest tubes remain in place. Dense consolidation remains throughout the left lung, not significantly changed. Right basilar airspace opacities are also unchanged. No definite pneumothorax or large pleural effusion is identified. The cardiac silhouette remains largely obscured. IMPRESSION: Unchanged dense left lung consolidation and mild right basilar opacity. Electronically Signed   By: Logan Bores M.D.   On: 02/27/2015 07:39   Dg Chest Port 1 View  02/26/2015  CLINICAL DATA:  Postop check. EXAM: PORTABLE CHEST 1 VIEW COMPARISON:  February 25, 2015 FINDINGS: The left chest tube remains. No pneumothorax. Diffuse opacification of left hemithorax is stable. Mild opacity in the right base is probably atelectasis. No other acute abnormalities. IMPRESSION: No interval change. Persistent opacity in the left lung diffusely and the right base. Electronically Signed   By: Dorise Bullion III M.D   On: 02/26/2015 10:54    Scheduled Meds: . antiseptic oral rinse  7 mL Mouth Rinse 10 times per day  . atorvastatin  40 mg Oral QHS  . bisacodyl  10 mg Oral Daily  . carvedilol  3.125 mg Oral BID  . chlorhexidine gluconate  15 mL Mouth Rinse BID  . docusate sodium  100 mg Oral BID  . feeding supplement (ENSURE ENLIVE)   237 mL Oral TID  . insulin aspart  0-9 Units Subcutaneous TID WC  . ipratropium-albuterol  3 mL Nebulization Once  . lidocaine  1 patch Transdermal Q24H  . LORazepam  1 mg Oral QHS  . meropenem (MERREM) IV  1 g Intravenous 3 times per day  . oxybutynin  5 mg Oral QHS  . pantoprazole  40 mg Oral BH-q7a  . pregabalin  200 mg Oral BID  . traZODone  100 mg Oral QHS  . venlafaxine XR  75 mg Oral Daily   Continuous Infusions: . dextrose 50 mL/hr at 02/27/15 0527    Assessment/Plan:  1. Acute respiratory  failure with hypoxia. Continue supportive care with oxygen therapy 2. Chest pain status post thoracoscopy and lung biopsy for pulmonary hemorrhage. When necessary IV morphine and oral oxycodone. Currently under control 3. Suspected pneumonia vancomycin and meropenem. Continue current antibiotics until respiratory status improved then can change over to oral 4. Essential hypertension continue Coreg 5. Type 2 diabetes mellitus on sliding scale blood glucose stable 6. History of CVA with left-sided paralysis- off anticoagulation at this point. Resume once okay per surgery 7. Hyperlipidemia unspecified on atorvastatin  Code Status:     Code Status Orders        Start     Ordered   02/24/15 1530  Full code   Continuous     02/24/15 1529    Code Status History    Date Active Date Inactive Code Status Order ID Comments User Context   01/04/2015  8:57 AM 01/06/2015  8:46 PM Full Code [id number]  Harrie Foreman, MD Inpatient   12/15/2014  7:01 PM 12/16/2014  4:16 PM Full Code [id number]  Demetrios Loll, MD Inpatient   11/16/2014  5:48 AM 11/21/2014  4:42 PM Full Code [id number]  Verlin Dike, RN Inpatient   11/16/2014  4:31 AM 11/16/2014  5:47 AM DNR [id number]  Quintella Baton, MD Inpatient    Advance Directive Documentation        Most Recent Value   Type of Advance Directive  Healthcare Power of Attorney, Living will   Pre-existing out of facility DNR order (yellow form or pink MOST  form)     "MOST" Form in Place?       Disposition Plan: to be determined  Consultants:  Pulmonary  Cardiothoracic surgery  Procedures:  Thoracoscopy lung biopsy and chest tube  Antibiotics:  Vancomycin  Meropenem  Time spent: 30 minutes, patient is critically ill  Fitzpatrick Alberico, [place] [facility]

## 2015-02-27 NOTE — Progress Notes (Signed)
Chaplain rounded unit. Patient sleeping. Chaplain said silent prayer.  Minerva Fester [phone]

## 2015-02-27 NOTE — Progress Notes (Addendum)
ANTIBIOTIC CONSULT NOTE - Follow-up  Pharmacy Consult for meropenem (day 3/5) Indication: r/o pneumonia  Allergies  Allergen Reactions  . Azithromycin   . Penicillins Rash    Patient Measurements: Height: '5\' 4"'$  (162.6 cm) Weight: 162 lb (73.483 kg) IBW/kg (Calculated) : 54.7   Vital Signs: Temp: 98.3 F (36.8 C) (01/27 0700) Temp Source: Oral (01/27 0700) BP: 119/66 mmHg (01/27 1200) Pulse Rate: 115 (01/27 1200) Intake/Output from previous day: 01/26 0701 - 01/27 0700 In: 1983.3 [P.O.:360; I.V.:1173.3; IV Piggyback:450] Out: 2710 [Urine:2450; Drains:220; Chest Tube:40] Intake/Output from this shift: Total I/O In: 360 [P.O.:360] Out: -   Labs:  Recent Labs  02/25/15 0523 02/26/15 1019 02/26/15 2039  WBC 19.0*  --  17.4*  HGB 11.8*  --  11.7*  PLT 427  --  319  CREATININE 0.57 0.58 0.62   Estimated Creatinine Clearance: 69.8 mL/min (by C-G formula based on Cr of 0.62). No results for input(s): VANCOTROUGH, VANCOPEAK, VANCORANDOM, GENTTROUGH, GENTPEAK, GENTRANDOM, TOBRATROUGH, TOBRAPEAK, TOBRARND, AMIKACINPEAK, AMIKACINTROU, AMIKACIN in the last 72 hours.   Microbiology: Recent Results (from the past 720 hour(s))  Surgical pcr screen     Status: None   Collection Time: 02/18/15  1:23 PM  Result Value Ref Range Status   MRSA, PCR NEGATIVE NEGATIVE Final   Staphylococcus aureus NEGATIVE NEGATIVE Final    Comment:        The Xpert SA Assay (FDA approved for NASAL specimens in patients over 81 years of age), is one component of a comprehensive surveillance program.  Test performance has been validated by [facility] for patients greater than or equal to 78 year old. It is not intended to diagnose infection nor to guide or monitor treatment.   Anaerobic culture     Status: None (Preliminary result)   Collection Time: 02/24/15 12:04 PM  Result Value Ref Range Status   Specimen Description LUNG  Final   Special Requests NONE  Final   Culture NO ANAEROBES  ISOLATED  Final   Report Status PENDING  Incomplete  Fungus Culture with Smear     Status: None (Preliminary result)   Collection Time: 02/24/15 12:04 PM  Result Value Ref Range Status   Specimen Description LUNG  Final   Special Requests NONE  Final   Culture NO GROWTH 3 DAYS  Final   Report Status PENDING  Incomplete  Tissue culture     Status: None (Preliminary result)   Collection Time: 02/24/15 12:04 PM  Result Value Ref Range Status   Specimen Description LUNG  Final   Special Requests NONE  Final   Gram Stain MODERATE WBC SEEN NO ORGANISMS SEEN   Final   Culture NO GROWTH 3 DAYS  Final   Report Status PENDING  Incomplete    Assessment:   Pharmacy consulted to dose meropenem for 65 yo female being treated for possible PNA admitted s/p left thoracoscopy. Patient on day 3/5 of meropenem 1g IV Q8hr. Patient previously started on vancomycin '750mg'$  IV Q12hr x 2 days.  Plan:  Will continue patient on meropenem 1g IV Q8hr.    Pharmacy will continue to monitor and adjust per consult.    Abrar Bilton L 02/27/2015,3:22 PM

## 2015-02-27 NOTE — Op Note (Signed)
02/24/2015  1:18 PM  PATIENT: Emily Kidd 65 y.o. female  PRE-OPERATIVE DIAGNOSIS: Pulmonary hemorrhage  POST-OPERATIVE DIAGNOSIS: Same  PROCEDURE: Left thoracoscopy with biopsy of left upper and left lower lobes  SURGEON: Surgeon(s) and Role:  * Nestor Lewandowsky, MD - Primary  * Lucretia Field, MD - Assisting  ASSISTANTS: [facility] PAS  ANESTHESIA: Gen.  INDICATIONS FOR PROCEDURE this 65 year old woman has had a several week history of hemoptysis with a CT scan showing a diffuse interstitial process involving the left lung mostly the left upper lobe. She had been treated with multiple empiric antibiotics and steroids with failure to resolve her symptoms. She was apprised of the indications and risks of surgery and she gave her informed consent.  DICTATION: The patient brought to the operating suite and placed in the supine position. General endotracheal anesthesia was given with a double-lumen tube. Preoperative bronchoscopy was carried out and confirmed it to dependent proper position. Patient was then turned for a left thoracoscopy. All pressure points were carefully padded. The patient is prepped and draped in usual sterile fashion.  We began by making an incision anteriorly at approximately the fourth intercostal space. The incision was deepened down through the muscles of the chest wall to the pleural space was entered. Upon entering the pleural space a thoracoscope was introduced and 2 additional port sites were created in a triangular fashion on the lower aspect the left hemithorax. Through these 3 20 mm port sites were able to complete the procedure. Thoracoscopy was carried out and the upper and lower lobes were both abnormal with significant amounts of subpleural carbonaceous debris or blood as well as evidence of fibrosis and honeycombing. We then chosen a section of the upper lobe and the lower lobe inferiorly along the edges to biopsy. A generous portion of each was  sampled with the endoscopic stapler. Within the upper lobe the staple line was removed and cut into pieces and sent for appropriate microbiologic cultures. Other pieces were sent for permanent section. The lower lobe was sent for permanent section.  We then placed a 57 Blake through our most inferior port and brought out through separate stab wound. We placed a 28 straight chest tube to the apex of the lung and brought it out through a separate stab wound. All wounds were then closed in multiple layers using running absorbable sutures on the fascia and subcutaneous tissues. The skin was closed with nylon. The chest tubes were secured with silk.  The patient was then extubated and taken to the recovery room in stable condition. All sponge needle and ensuring counts were correct as reported to me at the end of the case.   Nestor Lewandowsky, MD

## 2015-02-27 NOTE — Progress Notes (Signed)
RT to room, decreased FIO2 from 60% to 50% and flow from 45 to 40 LPM at verbal request of Dr. Mortimer Fries and Dr. Stevenson Clinch.  Patient tolerated change well, sats 95%.  Will continue to monitor.

## 2015-02-27 NOTE — Progress Notes (Signed)
Chest XRay from this morning shows minimal improvement in left lung aeration.  The chest tube has drained 280 cc over the last 24 hours so we will not take any of the tubes out yet.  I did request Oncology to see the patient to discuss treatment options.  My short term goal for her is to wean her oxygen today and get her out of bed to sit in the chair.

## 2015-02-28 LAB — BASIC METABOLIC PANEL
ANION GAP: 5 (ref 5–15)
BUN: <5 mg/dL — ABNORMAL LOW (ref 6–20)
CALCIUM: 8.3 mg/dL — AB (ref 8.9–10.3)
CO2: 32 mmol/L (ref 22–32)
Chloride: 110 mmol/L (ref 101–111)
Creatinine, Ser: 0.52 mg/dL (ref 0.44–1.00)
GFR calc Af Amer: >60 mL/min (ref >60)
Glucose, Bld: 106 mg/dL — ABNORMAL HIGH (ref 65–99)
POTASSIUM: 3.7 mmol/L (ref 3.5–5.1)
SODIUM: 147 mmol/L — AB (ref 135–145)

## 2015-02-28 LAB — ANAEROBIC CULTURE

## 2015-02-28 LAB — GLUCOSE, CAPILLARY
GLUCOSE-CAPILLARY: 117 mg/dL — AB (ref 65–99)
GLUCOSE-CAPILLARY: 98 mg/dL (ref 65–99)
GLUCOSE-CAPILLARY: 124 mg/dL — AB (ref 65–99)
Glucose-Capillary: 185 mg/dL — ABNORMAL HIGH (ref 65–99)

## 2015-02-28 LAB — TISSUE CULTURE: CULTURE: NO GROWTH

## 2015-02-28 LAB — CBC
HCT: 31.5 % — ABNORMAL LOW (ref 35.0–47.0)
Hemoglobin: 9.8 g/dL — ABNORMAL LOW (ref 12.0–16.0)
MCH: 25.3 pg — ABNORMAL LOW (ref 26.0–34.0)
MCHC: 31.1 g/dL — ABNORMAL LOW (ref 32.0–36.0)
MCV: 81.4 fL (ref 80.0–100.0)
PLATELETS: 300 10*3/uL (ref 150–440)
RBC: 3.87 MIL/uL (ref 3.80–5.20)
RDW: 16.9 % — ABNORMAL HIGH (ref 11.5–14.5)
WBC: 12 10*3/uL — AB (ref 3.6–11.0)

## 2015-02-28 MED ORDER — IPRATROPIUM-ALBUTEROL 0.5-2.5 (3) MG/3ML IN SOLN
3.0000 mL | Freq: Four times a day (QID) | RESPIRATORY_TRACT | Status: DC
  Administered 2015-02-28 – 2015-03-06 (×19): 3 mL via RESPIRATORY_TRACT
  Filled 2015-02-28 (×21): qty 3

## 2015-02-28 MED ORDER — MIDODRINE HCL 5 MG PO TABS
5.0000 mg | ORAL_TABLET | Freq: Three times a day (TID) | ORAL | Status: DC
  Administered 2015-02-28 – 2015-03-06 (×18): 5 mg via ORAL
  Filled 2015-02-28 (×18): qty 1

## 2015-02-28 NOTE — Progress Notes (Signed)
Speech Language Pathology Dysphagia Treatment Patient Details Name: Emily Kidd MRN: [id number] DOB: 22-Jan-1951 Today's Date: 02/28/2015 Time: 4469-5072 SLP Time Calculation (min) (ACUTE ONLY): 20 min  Assessment / Plan / Recommendation Clinical Impression   pt was administered trials of puree with medications crushed and trials of thin via straw. ST educated pt to sit at 90 degrees for safe po intake. Pt refused further trials of ndds 3 or regular textures as she stated she just had "a lot done". MD had just left room as SLP entered. Pt is being moved out of ccu today. SLP educated pt and nursing to remain on current ndds3 diet.     Diet Recommendation    NDDS3 with thin liquids   SLP Plan Continue with current plan of care   Pertinent Vitals/Pain No pain reported   Swallowing Goals     General Behavior/Cognition: Alert;Cooperative;Pleasant mood Patient Positioning: Upright in bed HPI: Pt is a 65 y.o. female with PMH including HTN, COPD, lung mass, spesis, DM, GERD, TIA w/ Left sided weakness, chronic pain, drug abuse in remission, anxiety, and multiple other medical issues who presents w/ known history of several week history hemoptysis with CT scan suggestive interstitial process involving the left lung mostly the left upper lobe. Patient been treated with multiple antibiotics and continued to have the symptoms therefore cardiothoracic surgery took her to the operating room and did a left arthroscopy with biopsy of the left upper and left lower lobes. Patient has a chest tube in place. She is complaining of some pain at the site of the chest tube. She is also on a full face mask. This as a chest pain she is not complaining of any shortness of breath. Pt has had a cough and MD concerned wanting to r/o dysphagia. Pt is currently on a mech soft diet w/ thin liquids and tolerating this per NSG given assistance at meals w/ feeding. Pt is verbally conversive making her wants/needs known.  Oral  Cavity - Oral Hygiene     Dysphagia Treatment Treatment Methods: Skilled observation;Patient/caregiver education Patient observed directly with PO's: Yes Type of PO's observed: Thin liquids;Dysphagia 1 (puree) Feeding: Able to feed self;Needs assist Liquids provided via: Teaspoon;Straw Type of cueing: Verbal Amount of cueing: Independent   GO     Emily Kidd 02/28/2015, 11:50 AM

## 2015-02-28 NOTE — Progress Notes (Signed)
Spoke with Dr. Genevive Bi about patient's lung sounds, crackles/rhonchi heard. Ordered to decrease fluids to 10 mL/hr. Patient resting comfortably at this time, skin assessed with Manuela Schwartz, RN. Focused assessment as documented. Telemetry verified.  Wilnette Kales

## 2015-02-28 NOTE — Progress Notes (Addendum)
Patient ID: Emily Kidd, female   DOB: August 22, 1950, 65 y.o.   MRN: [id number] [facility] PROGRESS NOTE  Emily Kidd ZSW:[id number] DOB: 07-May-1950 DOA: 02/24/2015 PCP: [place]  HPI/Subjective:  Shouldn't is still on high flow nasal cannula. Blood pressure is low heart rate continues to be elevated. Vision feeling better   Objective: Filed Vitals:   02/28/15 0500 02/28/15 0600  BP:  128/78  Pulse: 110 106  Temp:    Resp: 21 19    Filed Weights   02/24/15 0909  Weight: 73.483 kg (162 lb)    ROS: Review of Systems  Constitutional: Negative for fever and chills.  Eyes: Negative for blurred vision.  Respiratory: Positive for cough and improved shortness of breath.   Cardiovascular: Positive for chest pain.  Gastrointestinal: Negative for nausea, vomiting, abdominal pain, diarrhea and constipation.  Genitourinary: Negative for dysuria.  Musculoskeletal: Negative for joint pain.  Neurological: Negative for dizziness and headaches.   Exam: Physical Exam  Constitutional: She is oriented to person, place, and time.  HENT:  Nose: No mucosal edema.  Mouth/Throat: No oropharyngeal exudate or posterior oropharyngeal edema.  Eyes: Conjunctivae, EOM and lids are normal. Pupils are equal, round, and reactive to light.  Neck: No JVD present. Carotid bruit is not present. No edema present. No thyroid mass and no thyromegaly present.  Cardiovascular: S1 normal and S2 normal.  Exam reveals no gallop.   Murmur heard.  Systolic murmur is present with a grade of 3/6  Pulses:      Dorsalis pedis pulses are 2+ on the right side, and 2+ on the left side.  Respiratory:. She has decreased breath sounds in the left lung occasional rhonchi GI: Soft. Bowel sounds are normal. There is no tenderness.  Musculoskeletal:       Right ankle: She exhibits swelling.  Lymphadenopathy:    She has no cervical adenopathy.  Neurological: She is alert and oriented to person,  place, and time.  Left-sided paralysis  Skin: Skin is warm. No rash noted. Nails show no clubbing.  Psychiatric: She has a normal mood and affect.      Data Reviewed: Basic Metabolic Panel:  Recent Labs Lab 02/24/15 0947 02/25/15 0523 02/26/15 1019 02/26/15 2039 02/28/15 0456  NA 143 152* 143 141 147*  K 3.6 4.1 3.7 4.2 3.7  CL  --  117* 111 110 110  CO2  --  '28 26 22 '$ 32  GLUCOSE 102* 151* 166* 152* 106*  BUN  --  <5* 6 6 <5*  CREATININE  --  0.57 0.58 0.62 0.52  CALCIUM  --  8.4* 8.2* 8.2* 8.3*   Liver Function Tests:  Recent Labs Lab 02/25/15 0523  AST 17  ALT 13*  ALKPHOS 101  BILITOT 0.8  PROT 7.0  ALBUMIN 3.1*   CBC:  Recent Labs Lab 02/24/15 0947 02/25/15 0523 02/26/15 2039 02/28/15 0456  WBC  --  19.0* 17.4* 12.0*  HGB 11.6* 11.8* 11.7* 9.8*  HCT 34.0* 37.5 38.4 31.5*  MCV  --  81.9 82.5 81.4  PLT  --  427 319 300   CBG:  Recent Labs Lab 02/26/15 1802 02/27/15 0820 02/27/15 1138 02/27/15 1705 02/28/15 0917  GLUCAP 126* 125* 141* 126* 117*    Recent Results (from the past 240 hour(s))  Surgical pcr screen     Status: None   Collection Time: 02/18/15  1:23 PM  Result Value Ref Range Status   MRSA, PCR NEGATIVE NEGATIVE Final  Staphylococcus aureus NEGATIVE NEGATIVE Final    Comment:        The Xpert SA Assay (FDA approved for NASAL specimens in patients over 64 years of age), is one component of a comprehensive surveillance program.  Test performance has been validated by [facility] for patients greater than or equal to 62 year old. It is not intended to diagnose infection nor to guide or monitor treatment.   Anaerobic culture     Status: None (Preliminary result)   Collection Time: 02/24/15 12:04 PM  Result Value Ref Range Status   Specimen Description LUNG  Final   Special Requests NONE  Final   Culture NO ANAEROBES ISOLATED  Final   Report Status PENDING  Incomplete  Fungus Culture with Smear     Status: None  (Preliminary result)   Collection Time: 02/24/15 12:04 PM  Result Value Ref Range Status   Specimen Description LUNG  Final   Special Requests NONE  Final   Culture NO GROWTH 3 DAYS  Final   Report Status PENDING  Incomplete  Tissue culture     Status: None   Collection Time: 02/24/15 12:04 PM  Result Value Ref Range Status   Specimen Description LUNG  Final   Special Requests NONE  Final   Gram Stain MODERATE WBC SEEN NO ORGANISMS SEEN   Final   Culture NO GROWTH 3 DAYS  Final   Report Status 02/28/2015 FINAL  Final     Studies: Dg Chest Port 1 View  02/27/2015  CLINICAL DATA:  Postop check. EXAM: PORTABLE CHEST 1 VIEW COMPARISON:  02/26/2015 FINDINGS: The patient has taken a slightly greater inspiration than on the prior study. Left-sided chest tubes remain in place. Dense consolidation remains throughout the left lung, not significantly changed. Right basilar airspace opacities are also unchanged. No definite pneumothorax or large pleural effusion is identified. The cardiac silhouette remains largely obscured. IMPRESSION: Unchanged dense left lung consolidation and mild right basilar opacity. Electronically Signed   By: Logan Bores M.D.   On: 02/27/2015 07:39    Scheduled Meds: . antiseptic oral rinse  7 mL Mouth Rinse 10 times per day  . atorvastatin  40 mg Oral QHS  . bisacodyl  10 mg Oral Daily  . budesonide (PULMICORT) nebulizer solution  0.5 mg Nebulization BID  . carvedilol  3.125 mg Oral BID  . chlorhexidine gluconate  15 mL Mouth Rinse BID  . docusate sodium  100 mg Oral BID  . feeding supplement (ENSURE ENLIVE)  237 mL Oral TID  . insulin aspart  0-9 Units Subcutaneous TID WC  . ipratropium-albuterol  3 mL Nebulization Once  . ipratropium-albuterol  3 mL Nebulization Q4H  . lidocaine  1 patch Transdermal Q24H  . meropenem (MERREM) IV  1 g Intravenous 3 times per day  . oxybutynin  5 mg Oral QHS  . pantoprazole  40 mg Oral BH-q7a  . pregabalin  200 mg Oral BID  .  traZODone  100 mg Oral QHS  . venlafaxine XR  75 mg Oral Daily   Continuous Infusions: . dextrose 50 mL/hr at 02/27/15 0527    Assessment/Plan:  1. Acute respiratory failure with hypoxia. Continue supportive care with oxygen therapy 2. Chest pain status post thoracoscopy and lung biopsy for pulmonary hemorrhage. When necessary IV morphine and oral oxycodone. Currently pain under control 3. Suspected pneumonia vancomycin and meropenem. Continue current antibiotics until respiratory status improved then can change over to oral 4. Hypotension continue IV fluids due  to heart rate being elevated left to continue Coreg and I will add midrodine 5. Type 2 diabetes mellitus on sliding scale blood glucose stable 6. History of CVA with left-sided paralysis- off anticoagulation at this point. Resume once okay per surgery 7. Hyperlipidemia unspecified on atorvastatin   Code Status:     Code Status Orders        Start     Ordered   02/24/15 1530  Full code   Continuous     02/24/15 1529    Code Status History    Date Active Date Inactive Code Status Order ID Comments User Context   01/04/2015  8:57 AM 01/06/2015  8:46 PM Full Code [id number]  Harrie Foreman, MD Inpatient   12/15/2014  7:01 PM 12/16/2014  4:16 PM Full Code [id number]  Demetrios Loll, MD Inpatient   11/16/2014  5:48 AM 11/21/2014  4:42 PM Full Code [id number]  Verlin Dike, RN Inpatient   11/16/2014  4:31 AM 11/16/2014  5:47 AM DNR [id number]  Quintella Baton, MD Inpatient    Advance Directive Documentation        Most Recent Value   Type of Advance Directive  Healthcare Power of Attorney, Living will   Pre-existing out of facility DNR order (yellow form or pink MOST form)     "MOST" Form in Place?       Disposition Plan: to be determined  Consultants:  Pulmonary  Cardiothoracic surgery  Procedures:  Thoracoscopy lung biopsy and chest tube  Antibiotics:  Vancomycin  Meropenem  Time spent: 30 minutes, patient  is critically ill  Shamari Trostel, [place] [place] [facility]

## 2015-02-28 NOTE — Progress Notes (Signed)
Emily Kidd Inpatient Post-Op Note  Patient ID: Emily Kidd, female   DOB: January 25, 1951, 65 y.o.   MRN: [id number]  HISTORY: No problems overnight.  Seen by Dr. Grayland Ormond in Oncology.  She has declined chemotherapy.  Currently on high flow nasal cannula.     Filed Vitals:   02/28/15 0500 02/28/15 0600  BP:  128/78  Pulse: 110 106  Temp:    Resp: 21 19     EXAM: Resp: Lungs are clear on the right but diminished on the left Heart:  Regular without murmurs  Oxygen sats in mid 90s.    No air leak    ASSESSMENT: I removed one of her two chest tubes.  She is stable from a surgery standpoint to move to floor if Pulmonary agrees   PLAN:   Will need social services to help coordinate care as she has limited resources. Will likely remove chest tube tomorrow    Nestor Lewandowsky, MD

## 2015-03-01 ENCOUNTER — Inpatient Hospital Stay: Payer: Medicare (Managed Care)

## 2015-03-01 LAB — GLUCOSE, CAPILLARY
GLUCOSE-CAPILLARY: 101 mg/dL — AB (ref 65–99)
Glucose-Capillary: 151 mg/dL — ABNORMAL HIGH (ref 65–99)
Glucose-Capillary: 114 mg/dL — ABNORMAL HIGH (ref 65–99)
Glucose-Capillary: 136 mg/dL — ABNORMAL HIGH (ref 65–99)

## 2015-03-01 MED ORDER — ANTISEPTIC ORAL RINSE SOLUTION (CORINZ)
7.0000 mL | OROMUCOSAL | Status: DC
  Administered 2015-03-02 – 2015-03-04 (×14): 7 mL via OROMUCOSAL
  Filled 2015-03-01 (×29): qty 7

## 2015-03-01 MED ORDER — PENTAFLUOROPROP-TETRAFLUOROETH EX AERO
INHALATION_SPRAY | CUTANEOUS | Status: DC | PRN
  Administered 2015-03-01: 30 via TOPICAL
  Filled 2015-03-01: qty 103.5

## 2015-03-01 MED ORDER — ERTAPENEM SODIUM 1 G IJ SOLR
1.0000 g | Freq: Once | INTRAMUSCULAR | Status: AC
  Administered 2015-03-01: 1 g via INTRAMUSCULAR
  Filled 2015-03-01: qty 1

## 2015-03-01 MED ORDER — SODIUM CHLORIDE 0.9 % IV SOLN
1.0000 g | Freq: Three times a day (TID) | INTRAVENOUS | Status: AC
  Filled 2015-03-01: qty 1

## 2015-03-01 MED ORDER — POLYVINYL ALCOHOL 1.4 % OP SOLN
1.0000 [drp] | OPHTHALMIC | Status: DC | PRN
  Administered 2015-03-01 – 2015-03-02 (×2): 1 [drp] via OPHTHALMIC
  Filled 2015-03-01: qty 15

## 2015-03-01 NOTE — Progress Notes (Signed)
Per Dr. Posey Pronto d/c foley at this time and hold coreg. Patient is refusing to sit up in chair today, will continue to work with patient throughout day.  Wilnette Kales

## 2015-03-01 NOTE — Progress Notes (Signed)
Loss of IV access at this time. Unable to start new IV. Dr. Posey Pronto notified and PICC line ordered. [facility] called, waiting on arrival. [facility]

## 2015-03-01 NOTE — Progress Notes (Signed)
Patient's nephew [facility]) contacted for update, no answer. Left message to call back. Wilnette Kales

## 2015-03-01 NOTE — Progress Notes (Signed)
Patient was very agitated during PICC insertion procedure, stated that she did not want to proceed. PICC line nurse, Benjamine Mola, called myself in to speak with patient and patient stated that she was okay to proceed during insertion. Patient was calm at this time and understood procedure that was taking place. Patient was explained the reason for PICC again and agreed to proceed. Patient once again became agitated during procedure and wanted insertion to stop, at this time Benjamine Mola [facility] nurse) stopped insertion. Spoke with patient again and explained that the PICC insertion was stopped at request, patient calm at this time. Wilnette Kales

## 2015-03-01 NOTE — Progress Notes (Signed)
5 Days Post-Op   Subjective:  She has been doing relatively well but continues to require high levels of oxygen supplementation. Her x-ray is unchanged and she drained less than 50 cc over the course of last night.  Vital signs in last 24 hours: Temp:  [97.4 F (36.3 C)-98.8 F (37.1 C)] 97.4 F (36.3 C) (01/29 1054) Pulse Rate:  [94-113] 105 (01/29 1054) Resp:  [16-18] 18 (01/29 1054) BP: (94-144)/(53-111) 144/60 mmHg (01/29 1054) SpO2:  [86 %-95 %] 94 % (01/29 1317) FiO2 (%):  [55 %-65 %] 55 % (01/29 1317) Last BM Date:  (unknown)  Intake/Output from previous day: 01/28 0701 - 01/29 0700 In: 900.7 [I.V.:500.7; IV Piggyback:400] Out: 4580 [Urine:3750]  GI: Not appropriate for this patient  Lab Results:  CBC  Recent Labs  02/26/15 2039 02/28/15 0456  WBC 17.4* 12.0*  HGB 11.7* 9.8*  HCT 38.4 31.5*  PLT 319 300   CMP     Component Value Date/Time   NA 147* 02/28/2015 0456   NA 142 03/02/2014 1244   K 3.7 02/28/2015 0456   K 4.1 03/02/2014 1244   CL 110 02/28/2015 0456   CL 110* 03/02/2014 1244   CO2 32 02/28/2015 0456   CO2 27 03/02/2014 1244   GLUCOSE 106* 02/28/2015 0456   GLUCOSE 94 03/02/2014 1244   BUN <5* 02/28/2015 0456   BUN 8 03/02/2014 1244   CREATININE 0.52 02/28/2015 0456   CREATININE 0.74 03/02/2014 1244   CALCIUM 8.3* 02/28/2015 0456   CALCIUM 9.2 03/02/2014 1244   PROT 7.0 02/25/2015 0523   PROT 7.3 03/02/2014 1244   ALBUMIN 3.1* 02/25/2015 0523   ALBUMIN 3.6 03/02/2014 1244   AST 17 02/25/2015 0523   AST 34 03/02/2014 1244   ALT 13* 02/25/2015 0523   ALT 33 03/02/2014 1244   ALKPHOS 101 02/25/2015 0523   ALKPHOS 100 03/02/2014 1244   BILITOT 0.8 02/25/2015 0523   BILITOT 0.4 03/02/2014 1244   GFRNONAA >60 02/28/2015 0456   GFRNONAA >60 03/02/2014 1244   GFRNONAA >60 06/12/2013 0524   GFRAA >60 02/28/2015 0456   GFRAA >60 03/02/2014 1244   GFRAA >60 06/12/2013 0524   PT/INR No results for input(s): LABPROT, INR in the last 72  hours.  Studies/Results: Dg Chest 2 View  03/01/2015  CLINICAL DATA:  Postop, chest tube.  Pneumonia. EXAM: CHEST  2 VIEW COMPARISON:  02/27/2015 FINDINGS: Left chest tube remains in place. Small left apical pneumothorax is stable. Diffuse left lung airspace disease again noted, unchanged. Improving aeration of the right lung base. No acute bony abnormality. Heart is upper limits normal in size. IMPRESSION: Small left apical pneumothorax with left chest tube in place, stable. Diffuse left lung airspace disease is stable. Improving aeration at the right base. Electronically Signed   By: Rolm Baptise M.D.   On: 03/01/2015 09:24    Assessment/Plan: She is doing relatively well. She still has high oxygen requirements. Her x-ray is unchanged. She had minimal drainage overnight off SUCTION. Chest tube was removed without difficulty. Chest x-ray will be repeated in the morning.

## 2015-03-01 NOTE — Progress Notes (Signed)
Patient's nephew (POA) updated by this RN about inability to obtain peripheral IV or PICC access. Explained that procedure was stopped per patient's request and that any further antibiotics will be given IM instead of IV. Patient's nephew voiced understanding and agreement. Rachael Fee, RN

## 2015-03-01 NOTE — Progress Notes (Signed)
Patient ID: Emily Kidd, female   DOB: 1950/08/20, 65 y.o.   MRN: [id number] [facility] PROGRESS NOTE  Emily Kidd [id number]:[id number] DOB: [birth year] DOA: 02/24/2015 PCP: [place]  HPI/Subjective:  Patient still on high flow oxygen. Denies any chest pain or shortness of breath   Objective: Filed Vitals:   03/01/15 0608 03/01/15 0941  BP: 94/53 103/60  Pulse: 107 94  Temp:    Resp:      Filed Weights   02/24/15 0909  Weight: 73.483 kg (162 lb)    ROS: Review of Systems  Constitutional: Negative for fever and chills.  Eyes: Negative for blurred vision.  Respiratory: Positive for cough and resolved shortness of breath.   Cardiovascular: Positive for chest pain.  Gastrointestinal: Negative for nausea, vomiting, abdominal pain, diarrhea and constipation.  Genitourinary: Negative for dysuria.  Musculoskeletal: Negative for joint pain.  Neurological: Negative for dizziness and headaches.   Exam: Physical Exam  Constitutional: She is oriented to person, place, and time.  HENT:  Nose: No mucosal edema.  Mouth/Throat: No oropharyngeal exudate or posterior oropharyngeal edema.  Eyes: Conjunctivae, EOM and lids are normal. Pupils are equal, round, and reactive to light.  Neck: No JVD present. Carotid bruit is not present. No edema present. No thyroid mass and no thyromegaly present.  Cardiovascular: S1 normal and S2 normal.  Exam reveals no gallop.   Murmur heard.  Systolic murmur is present with a grade of 3/6  Pulses:      Dorsalis pedis pulses are 2+ on the right side, and 2+ on the left side.  Respiratory: No respiratory distress. She has decreased breath sounds in the left lung occasional rhonchi GI: Soft. Bowel sounds are normal. There is no tenderness.  Musculoskeletal:       Right ankle: She exhibits swelling.  Lymphadenopathy:    She has no cervical adenopathy.  Neurological: She is alert and oriented to person, place, and time.   Left-sided paralysis  Skin: Skin is warm. No rash noted. Nails show no clubbing.  Psychiatric: She has a normal mood and affect.      Data Reviewed: Basic Metabolic Panel:  Recent Labs Lab 02/24/15 0947 02/25/15 0523 02/26/15 1019 02/26/15 2039 02/28/15 0456  NA 143 152* 143 141 147*  K 3.6 4.1 3.7 4.2 3.7  CL  --  117* 111 110 110  CO2  --  '28 26 22 '$ 32  GLUCOSE 102* 151* 166* 152* 106*  BUN  --  <5* 6 6 <5*  CREATININE  --  0.57 0.58 0.62 0.52  CALCIUM  --  8.4* 8.2* 8.2* 8.3*   Liver Function Tests:  Recent Labs Lab 02/25/15 0523  AST 17  ALT 13*  ALKPHOS 101  BILITOT 0.8  PROT 7.0  ALBUMIN 3.1*   CBC:  Recent Labs Lab 02/24/15 0947 02/25/15 0523 02/26/15 2039 02/28/15 0456  WBC  --  19.0* 17.4* 12.0*  HGB 11.6* 11.8* 11.7* 9.8*  HCT 34.0* 37.5 38.4 31.5*  MCV  --  81.9 82.5 81.4  PLT  --  427 319 300   CBG:  Recent Labs Lab 02/28/15 0917 02/28/15 1159 02/28/15 1720 02/28/15 2110 03/01/15 0744  GLUCAP 117* 98 185* 124* 151*    Recent Results (from the past 240 hour(s))  Anaerobic culture     Status: None   Collection Time: 02/24/15 12:04 PM  Result Value Ref Range Status   Specimen Description LUNG  Final   Special Requests NONE  Final   Culture NO ANAEROBES ISOLATED  Final   Report Status 02/28/2015 FINAL  Final  Fungus Culture with Smear     Status: None (Preliminary result)   Collection Time: 02/24/15 12:04 PM  Result Value Ref Range Status   Specimen Description LUNG  Final   Special Requests NONE  Final   Culture NO GROWTH 3 DAYS  Final   Report Status PENDING  Incomplete  Tissue culture     Status: None   Collection Time: 02/24/15 12:04 PM  Result Value Ref Range Status   Specimen Description LUNG  Final   Special Requests NONE  Final   Gram Stain MODERATE WBC SEEN NO ORGANISMS SEEN   Final   Culture NO GROWTH 3 DAYS  Final   Report Status 02/28/2015 FINAL  Final     Studies: Dg Chest 2 View  03/01/2015  CLINICAL  DATA:  Postop, chest tube.  Pneumonia. EXAM: CHEST  2 VIEW COMPARISON:  02/27/2015 FINDINGS: Left chest tube remains in place. Small left apical pneumothorax is stable. Diffuse left lung airspace disease again noted, unchanged. Improving aeration of the right lung base. No acute bony abnormality. Heart is upper limits normal in size. IMPRESSION: Small left apical pneumothorax with left chest tube in place, stable. Diffuse left lung airspace disease is stable. Improving aeration at the right base. Electronically Signed   By: Rolm Baptise M.D.   On: 03/01/2015 09:24    Scheduled Meds: . antiseptic oral rinse  7 mL Mouth Rinse 10 times per day  . atorvastatin  40 mg Oral QHS  . bisacodyl  10 mg Oral Daily  . budesonide (PULMICORT) nebulizer solution  0.5 mg Nebulization BID  . carvedilol  3.125 mg Oral BID  . chlorhexidine gluconate  15 mL Mouth Rinse BID  . docusate sodium  100 mg Oral BID  . feeding supplement (ENSURE ENLIVE)  237 mL Oral TID  . insulin aspart  0-9 Units Subcutaneous TID WC  . ipratropium-albuterol  3 mL Nebulization Q6H WA  . lidocaine  1 patch Transdermal Q24H  . meropenem (MERREM) IV  1 g Intravenous 3 times per day  . midodrine  5 mg Oral TID WC  . oxybutynin  5 mg Oral QHS  . pantoprazole  40 mg Oral BH-q7a  . pregabalin  200 mg Oral BID  . traZODone  100 mg Oral QHS  . venlafaxine XR  75 mg Oral Daily   Continuous Infusions: . dextrose 10 mL/hr at 02/28/15 1224    Assessment/Plan:  1. Acute respiratory failure with hypoxia. Continue supportive care with oxygen therapy respiratory status may improve after removing chest tube 2. Chest pain status post thoracoscopy and lung biopsy for pulmonary hemorrhage. When necessary IV morphine and oral oxycodone. Currently pain under control 3. Suspected pneumonia  vancomycin and meropenem. Continue current antibiotics until respiratory status improved then can change over to oral 4. Hypotension continue IV fluids patient on  Midrin D and that has helped the blood pressure 5. Type 2 diabetes mellitus on sliding scale blood glucose stable 6. History of CVA with left-sided paralysis- off anticoagulation at this point. Resume once okay per surgery 7. Hyperlipidemia unspecified on atorvastatin   Code Status:     Code Status Orders        Start     Ordered   02/24/15 1530  Full code   Continuous     02/24/15 1529    Code Status History    Date Active Date  Inactive Code Status Order ID Comments User Context   01/04/2015  8:57 AM 01/06/2015  8:46 PM Full Code [id number]  Harrie Foreman, MD Inpatient   12/15/2014  7:01 PM 12/16/2014  4:16 PM Full Code [id number]  Demetrios Loll, MD Inpatient   11/16/2014  5:48 AM 11/21/2014  4:42 PM Full Code [id number]  Verlin Dike, RN Inpatient   11/16/2014  4:31 AM 11/16/2014  5:47 AM DNR [id number]  Quintella Baton, MD Inpatient    Advance Directive Documentation        Most Recent Value   Type of Advance Directive  Healthcare Power of Attorney, Living will   Pre-existing out of facility DNR order (yellow form or pink MOST form)     "MOST" Form in Place?       Disposition Plan: to be determined  Consultants:  Pulmonary  Cardiothoracic surgery  Procedures:  Thoracoscopy lung biopsy and chest tube  Antibiotics:  Vancomycin  Meropenem  Time spent: 30 minutes,  Tiegan Jambor, [facility]

## 2015-03-01 NOTE — Progress Notes (Signed)
PICC insertion attempted, pt refused while in progress. No access obtained. MD Dr. Lavetta Nielsen notified, orders for meropenem IV to be changed to an injectable alternative. RN will continue to monitor. Rachael Fee, RN

## 2015-03-02 LAB — GLUCOSE, CAPILLARY
Glucose-Capillary: 105 mg/dL — ABNORMAL HIGH (ref 65–99)
Glucose-Capillary: 169 mg/dL — ABNORMAL HIGH (ref 65–99)
Glucose-Capillary: 131 mg/dL — ABNORMAL HIGH (ref 65–99)

## 2015-03-02 MED ORDER — ONDANSETRON HCL 4 MG PO TABS
ORAL_TABLET | ORAL | Status: AC
Start: 2015-03-02 — End: 2015-03-02
  Administered 2015-03-02: 4 mg
  Filled 2015-03-02: qty 1

## 2015-03-02 MED ORDER — ONDANSETRON HCL 4 MG PO TABS
4.0000 mg | ORAL_TABLET | Freq: Four times a day (QID) | ORAL | Status: DC | PRN
  Administered 2015-03-02 – 2015-03-03 (×2): 4 mg via ORAL
  Filled 2015-03-02: qty 1
  Filled 2015-03-02: qty 9

## 2015-03-02 NOTE — Progress Notes (Signed)
Patient alert and oriented.  VSS.  Dressings are dry and intact.  No IV access.

## 2015-03-02 NOTE — Progress Notes (Signed)
Pt is dnr her dnr form was brought by her primary care provider

## 2015-03-02 NOTE — Care Management Important Message (Signed)
Important Message  Patient Details  Name: Emily Kidd MRN: [id number] Date of Birth: 16-Mar-1950   Medicare Important Message Given:  Yes    [name] 03/02/2015, 10:10 AM

## 2015-03-02 NOTE — Progress Notes (Signed)
Pt's mother called RN stating that pt called her saying she was in pain and needed medicine. RN explained to the mother and the pt that pain and nausea medication had been given within the past 15 minutes and that it would need more time to work, both voiced understanding. Mother, Homero Fellers, requests that MD call her after rounding in the morning. RN will continue to monitor. Rachael Fee, RN

## 2015-03-02 NOTE — Progress Notes (Signed)
Patient ID: Emily Kidd, female   DOB: 02/23/1950, 65 y.o.   MRN: [id number] [facility] PROGRESS NOTE  Emily Kidd [id number] DOB: Jul 01, 1950 DOA: 02/24/2015 PCP: [place]  HPI/Subjective:  Patient's had a chest tube removed. Breathing to be stable denies any chest pain   Objective: Filed Vitals:   03/02/15 0253 03/02/15 0617  BP: 117/58 125/70  Pulse: 101 96  Temp: 97.8 F (36.6 C)   Resp: 20 18    Filed Weights   02/24/15 0909  Weight: 73.483 kg (162 lb)    ROS: Review of Systems  Constitutional: Negative for fever and chills.  Eyes: Negative for blurred vision.  Respiratory: Positive for cough and resolved shortness of breath.   Cardiovascular: Denies chest pain  Gastrointestinal: Negative for nausea, vomiting, abdominal pain, diarrhea and constipation.  Genitourinary: Negative for dysuria.  Musculoskeletal: Negative for joint pain.  Neurological: Negative for dizziness and headaches.   Exam: Physical Exam  Constitutional: She is oriented to person, place, and time.  HENT:  Nose: No mucosal edema.  Mouth/Throat: No oropharyngeal exudate or posterior oropharyngeal edema.  Eyes: Conjunctivae, EOM and lids are normal. Pupils are equal, round, and reactive to light.  Neck: No JVD present. Carotid bruit is not present. No edema present. No thyroid mass and no thyromegaly present.  Cardiovascular: S1 normal and S2 normal.  Exam reveals no gallop.   Murmur heard.  Systolic murmur is present with a grade of 3/6  Pulses:      Dorsalis pedis pulses are 2+ on the right side, and 2+ on the left side.  Respiratory: No respiratory distress. She has decreased breath sounds in the left lung occasional rhonchi GI: Soft. Bowel sounds are normal. There is no tenderness.  Musculoskeletal:       Right ankle: She exhibits swelling.  Lymphadenopathy:    She has no cervical adenopathy.  Neurological: She is alert and oriented to person, place, and  time.  Left-sided paralysis  Skin: Skin is warm. No rash noted. Nails show no clubbing.  Psychiatric: She has a normal mood and affect.      Data Reviewed: Basic Metabolic Panel:  Recent Labs Lab 02/24/15 0947 02/25/15 0523 02/26/15 1019 02/26/15 2039 02/28/15 0456  NA 143 152* 143 141 147*  K 3.6 4.1 3.7 4.2 3.7  CL  --  117* 111 110 110  CO2  --  '28 26 22 '$ 32  GLUCOSE 102* 151* 166* 152* 106*  BUN  --  <5* 6 6 <5*  CREATININE  --  0.57 0.58 0.62 0.52  CALCIUM  --  8.4* 8.2* 8.2* 8.3*   Liver Function Tests:  Recent Labs Lab 02/25/15 0523  AST 17  ALT 13*  ALKPHOS 101  BILITOT 0.8  PROT 7.0  ALBUMIN 3.1*   CBC:  Recent Labs Lab 02/24/15 0947 02/25/15 0523 02/26/15 2039 02/28/15 0456  WBC  --  19.0* 17.4* 12.0*  HGB 11.6* 11.8* 11.7* 9.8*  HCT 34.0* 37.5 38.4 31.5*  MCV  --  81.9 82.5 81.4  PLT  --  427 319 300   CBG:  Recent Labs Lab 03/01/15 0744 03/01/15 1232 03/01/15 1650 03/01/15 2055 03/02/15 0734  GLUCAP 151* 101* 114* 136* 105*    Recent Results (from the past 240 hour(s))  Anaerobic culture     Status: None   Collection Time: 02/24/15 12:04 PM  Result Value Ref Range Status   Specimen Description LUNG  Final   Special Requests  NONE  Final   Culture NO ANAEROBES ISOLATED  Final   Report Status 02/28/2015 FINAL  Final  Fungus Culture with Smear     Status: None (Preliminary result)   Collection Time: 02/24/15 12:04 PM  Result Value Ref Range Status   Specimen Description LUNG  Final   Special Requests NONE  Final   Culture NO FUNGUS ISOLATED AFTER 5 DAYS  Final   Report Status PENDING  Incomplete  Tissue culture     Status: None   Collection Time: 02/24/15 12:04 PM  Result Value Ref Range Status   Specimen Description LUNG  Final   Special Requests NONE  Final   Gram Stain MODERATE WBC SEEN NO ORGANISMS SEEN   Final   Culture NO GROWTH 3 DAYS  Final   Report Status 02/28/2015 FINAL  Final     Studies: Dg Chest 2  View  03/01/2015  CLINICAL DATA:  Postop, chest tube.  Pneumonia. EXAM: CHEST  2 VIEW COMPARISON:  02/27/2015 FINDINGS: Left chest tube remains in place. Small left apical pneumothorax is stable. Diffuse left lung airspace disease again noted, unchanged. Improving aeration of the right lung base. No acute bony abnormality. Heart is upper limits normal in size. IMPRESSION: Small left apical pneumothorax with left chest tube in place, stable. Diffuse left lung airspace disease is stable. Improving aeration at the right base. Electronically Signed   By: Rolm Baptise M.D.   On: 03/01/2015 09:24    Scheduled Meds: . antiseptic oral rinse  7 mL Mouth Rinse Q2H while awake  . atorvastatin  40 mg Oral QHS  . bisacodyl  10 mg Oral Daily  . budesonide (PULMICORT) nebulizer solution  0.5 mg Nebulization BID  . carvedilol  3.125 mg Oral BID  . chlorhexidine gluconate  15 mL Mouth Rinse BID  . docusate sodium  100 mg Oral BID  . feeding supplement (ENSURE ENLIVE)  237 mL Oral TID  . insulin aspart  0-9 Units Subcutaneous TID WC  . ipratropium-albuterol  3 mL Nebulization Q6H WA  . lidocaine  1 patch Transdermal Q24H  . meropenem (MERREM) IV  1 g Intravenous 3 times per day  . midodrine  5 mg Oral TID WC  . oxybutynin  5 mg Oral QHS  . pantoprazole  40 mg Oral BH-q7a  . pregabalin  200 mg Oral BID  . traZODone  100 mg Oral QHS  . venlafaxine XR  75 mg Oral Daily   Continuous Infusions: . dextrose Stopped (03/01/15 1900)    Assessment/Plan:  1. Acute respiratory failure with hypoxia. Continue supportive care with oxygen therapy . Wean oxygen as tolerated 2. Chest pain status post thoracoscopy and lung biopsy for pulmonary hemorrhage. When necessary IV morphine and oral oxycodone. Currently pain under control 3. Suspected pneumonia  currently on meropenem and continue for now 4. Hypotension  now resolved on Midrin Dean 5. Type 2 diabetes mellitus on sliding scale blood glucose stable 6. History of  CVA with left-sided paralysis- off anticoagulation at this point. Resume once okay per surgery 7. Hyperlipidemia unspecified on atorvastatin 8. CODE STATUS DO NOT RESUSCITATE patient's primary care provider Dr. Ovid Curd bought her DO NOT RESUSCITATE form.   Code Status:     Code Status Orders        Start     Ordered   02/24/15 1530  Full code   Continuous     02/24/15 1529    Code Status History    Date Active Date Inactive  Code Status Order ID Comments User Context   01/04/2015  8:57 AM 01/06/2015  8:46 PM Full Code [id number]  Harrie Foreman, MD Inpatient   12/15/2014  7:01 PM 12/16/2014  4:16 PM Full Code [id number]  Demetrios Loll, MD Inpatient   11/16/2014  5:48 AM 11/21/2014  4:42 PM Full Code [id number]  Verlin Dike, RN Inpatient   11/16/2014  4:31 AM 11/16/2014  5:47 AM DNR [id number]  Quintella Baton, MD Inpatient    Advance Directive Documentation        Most Recent Value   Type of Advance Directive  Healthcare Power of Attorney, Living will   Pre-existing out of facility DNR order (yellow form or pink MOST form)     "MOST" Form in Place?       Disposition Plan: to be determined  Consultants:  Pulmonary  Cardiothoracic surgery  Procedures:  Thoracoscopy lung biopsy and chest tube  Antibiotics:  Vancomycin  Meropenem  Time spent: 30 minutes,  Len Azeez, [place] [facility]

## 2015-03-02 NOTE — NC FL2 (Signed)
[place] LEVEL OF CARE SCREENING TOOL     IDENTIFICATION  Patient Name: Emily Kidd Birthdate: 1950/10/21 Sex: female Admission Date (Current Location): 02/24/2015  [facility] [place] Number:  Emily Kidd  ([id number]) Facility and Address:  [facility], [address], [place], [place] [address]      Provider Number: [id number]  Attending Physician Name and Address:  Nestor Lewandowsky, MD  Relative Name and Phone Number:       Current Level of Care: Hospital Recommended Level of Care: [place] Prior Approval Number:    Date Approved/Denied:   PASRR Number:  [id number])  Discharge Plan: SNF    Current Diagnoses: Patient Active Problem List   Diagnosis Date Noted  . Interstitial lung disease ([place]) 02/24/2015  . Chronic obstructive pulmonary disease with acute exacerbation ([place]) 01/06/2015  . Hypoxia 12/15/2014  . Adenopathy   . Sepsis ([place]) 11/21/2014  . Acute respiratory failure with hypoxia ([place]) 11/21/2014  . Encephalopathy, metabolic 78/46/9629  . Left upper lobe pneumonia 11/21/2014  . Hemoptysis 11/21/2014  . Pneumonia 11/16/2014  . COPD (chronic obstructive pulmonary disease) ([place]) 11/16/2014  . GERD (gastroesophageal reflux disease) 11/16/2014  . Peripheral neuropathy ([place]) 11/16/2014  . Peripheral vascular disease ([place]) 11/16/2014    Orientation RESPIRATION BLADDER Height & Weight     Self, Time, Situation, Place  O2 (High Flow Nasal Cannula 50 (L/Min ) ) Incontinent Weight: 162 lb (73.483 kg) Height:  '5\' 4"'$  (162.6 cm)  BEHAVIORAL SYMPTOMS/MOOD NEUROLOGICAL BOWEL NUTRITION STATUS   (None )  (None ) Continent Diet  AMBULATORY STATUS COMMUNICATION OF NEEDS Skin   Extensive Assist Verbally Normal                       Personal Care Assistance Level of Assistance  Bathing, Feeding, Dressing Bathing Assistance: Limited assistance Feeding assistance: Independent Dressing Assistance: Limited  assistance     Functional Limitations Info  Sight, Hearing, Speech Sight Info: Adequate Hearing Info: Adequate Speech Info: Adequate    SPECIAL CARE FACTORS FREQUENCY  PT (By licensed PT)     PT Frequency:  (5)              Contractures      Additional Factors Info  Code Status, Allergies Code Status Info:  (DNR ) Allergies Info:  (Azithromycin, Penicillins)           Current Medications (03/02/2015):  This is the current hospital active medication list Current Facility-Administered Medications  Medication Dose Route Frequency Provider Last Rate Last Dose  . acetaminophen (TYLENOL) tablet 650 mg  650 mg Oral Q6H PRN Loletha Grayer, MD   650 mg at 03/02/15 0626  . albuterol (PROVENTIL) (2.5 MG/3ML) 0.083% nebulizer solution 2.5 mg  2.5 mg Nebulization Q4H PRN Nestor Lewandowsky, MD   2.5 mg at 02/25/15 0314  . antiseptic oral rinse solution (CORINZ)  7 mL Mouth Rinse Q2H while awake Loletha Grayer, MD   7 mL at 03/02/15 1400  . atorvastatin (LIPITOR) tablet 40 mg  40 mg Oral QHS Nestor Lewandowsky, MD   40 mg at 03/01/15 2116  . bisacodyl (DULCOLAX) EC tablet 10 mg  10 mg Oral Daily Nestor Lewandowsky, MD   10 mg at 03/02/15 0858  . budesonide (PULMICORT) nebulizer solution 0.5 mg  0.5 mg Nebulization BID Flora Lipps, MD   0.5 mg at 03/02/15 0738  . carvedilol (COREG) tablet 3.125 mg  3.125 mg Oral BID Nestor Lewandowsky, MD  3.125 mg at 03/02/15 0857  . chlorhexidine gluconate (PERIDEX) 0.12 % solution 15 mL  15 mL Mouth Rinse BID Loletha Grayer, MD   15 mL at 03/01/15 0800  . dextrose 5 % solution   Intravenous Continuous Nestor Lewandowsky, MD   Stopped at 03/01/15 1900  . docusate sodium (COLACE) capsule 100 mg  100 mg Oral BID Loletha Grayer, MD   100 mg at 03/02/15 0857  . feeding supplement (ENSURE ENLIVE) (ENSURE ENLIVE) liquid 237 mL  237 mL Oral TID Vishal Mungal, MD   237 mL at 03/02/15 1200  . HYDROcodone-acetaminophen (NORCO/VICODIN) 5-325 MG per tablet 1-2 tablet  1-2 tablet Oral Q4H  PRN Nestor Lewandowsky, MD   2 tablet at 03/02/15 1158  . insulin aspart (novoLOG) injection 0-9 Units  0-9 Units Subcutaneous TID WC Dustin Flock, MD   2 Units at 03/02/15 1200  . ipratropium-albuterol (DUONEB) 0.5-2.5 (3) MG/3ML nebulizer solution 3 mL  3 mL Nebulization Q6H WA Flora Lipps, MD   3 mL at 03/02/15 1318  . lidocaine (LIDODERM) 5 % 1 patch  1 patch Transdermal Q24H Vishal Mungal, MD   1 patch at 03/02/15 1456  . meropenem (MERREM) 1 g in sodium chloride 0.9 % 100 mL IVPB  1 g Intravenous 3 times per day Lytle Butte, MD      . midodrine (PROAMATINE) tablet 5 mg  5 mg Oral TID WC Dustin Flock, MD   5 mg at 03/02/15 1200  . morphine 2 MG/ML injection 1-2 mg  1-2 mg Intravenous Q1H PRN Nestor Lewandowsky, MD   2 mg at 02/26/15 1606  . nitroGLYCERIN (NITROSTAT) SL tablet 0.4 mg  0.4 mg Sublingual Q5 min PRN Nestor Lewandowsky, MD      . ondansetron [facility]) injection 4 mg  4 mg Intravenous Q6H PRN Nestor Lewandowsky, MD   4 mg at 02/27/15 1618  . ondansetron (ZOFRAN) tablet 4 mg  4 mg Oral Q6H PRN Harrie Foreman, MD   4 mg at 03/02/15 1158  . oxybutynin (DITROPAN-XL) 24 hr tablet 5 mg  5 mg Oral QHS Nestor Lewandowsky, MD   5 mg at 03/01/15 2115  . oxyCODONE (Oxy IR/ROXICODONE) immediate release tablet 5 mg  5 mg Oral Q4H PRN Loletha Grayer, MD   5 mg at 03/02/15 0320  . pantoprazole (PROTONIX) EC tablet 40 mg  40 mg Oral Sammuel Cooper, MD   40 mg at 03/02/15 0458  . pentafluoroprop-tetrafluoroeth (GEBAUERS) aerosol   Topical PRN Quintella Baton, MD   30 application at 17/79/39 2115  . polyethylene glycol (MIRALAX / GLYCOLAX) packet 17 g  17 g Oral Daily PRN Loletha Grayer, MD      . polyvinyl alcohol (LIQUIFILM TEARS) 1.4 % ophthalmic solution 1 drop  1 drop Both Eyes PRN Quintella Baton, MD   1 drop at 03/02/15 0627  . pregabalin (LYRICA) capsule 200 mg  200 mg Oral BID Loletha Grayer, MD   200 mg at 03/02/15 0857  . traZODone (DESYREL) tablet 100 mg  100 mg Oral QHS Nestor Lewandowsky, MD   100 mg at  03/01/15 2116  . venlafaxine XR (EFFEXOR-XR) 24 hr capsule 75 mg  75 mg Oral Daily Nestor Lewandowsky, MD   75 mg at 03/02/15 0300   Facility-Administered Medications Ordered in Other Encounters  Medication Dose Route Frequency Provider Last Rate Last Dose  . lidocaine (XYLOCAINE) 2 % jelly 1 application  1 application Topical Once Flora Lipps, MD  Discharge Medications: Please see discharge summary for a list of discharge medications.  Relevant Imaging Results:  Relevant Lab Results:   Additional Information  (SSN [id number])  Lorenso Quarry Thedford Bunton, LCSW

## 2015-03-02 NOTE — Care Management (Signed)
Patient transferred out of ICU 1/28.  She continues of HFNC and patient would not be able to transfer to SNF with this 02 requirement.  Her 02 sats drop significantly with minimal activity.  Her chest tubes have been removes.  [facility] has a contract with [facility].  Reached out to [facility] to determine if patient would meet ltach criteria

## 2015-03-02 NOTE — Progress Notes (Signed)
Speech Language Pathology Treatment: Dysphagia  Patient Details Name: Emily Kidd MRN: [id number] DOB: [birth year] Today's Date: 03/02/2015 Time: 5625-6389 SLP Time Calculation (min) (ACUTE ONLY): 60 min  Assessment / Plan / Recommendation Clinical Impression  Pt appears improved overall and is awake/alert, verbally interactive. Pt stated her "appetite" has been "bad" and endorsed feelings of nausea. NSG reported she has not been eating much but drinking sodas w/out s/s of aspiration. Pt agreed to a few trials of po's but could not indicate any food she wanted. Pt appeared to adequately tolerate trials of thin liquids and soft solids w/ no overt pharyngeal phase dysphagia noted and no oral phase deficits noted; min. Increased mastication effort and time d/t edentulous status. Pt exhibited no overt s/s of aspiration; clear vocal quality b/t trials and no decline in respiratory status noted during/post po trials. Given time and alternating foods w/ liquids appeared adequate strategies for pt. Pt appears at reduced risk for aspiration following general aspiration precautions; rec. continue a dys. 3 diet w/ thin liquids; monitoring at meals as nec; meds in Puree w/ NSG. Rec. ST f/u as needed.     HPI HPI: Pt is a 65 y.o. female with PMH including HTN, COPD, lung mass, spesis, DM, GERD, TIA w/ Left sided weakness, chronic pain, drug abuse in remission, anxiety, and multiple other medical issues who presents w/ known history of several week history hemoptysis with CT scan suggestive interstitial process involving the left lung mostly the left upper lobe. Patient been treated with multiple antibiotics and continued to have the symptoms therefore cardiothoracic surgery took her to the operating room and did a left arthroscopy with biopsy of the left upper and left lower lobes. Patient has a chest tube in place. She is complaining of some pain at the site of the chest tube. She is also on a full face mask. This as a  chest pain she is not complaining of any shortness of breath. Pt is currently on a mech soft diet w/ thin liquids and tolerating this per NSG given assistance at meals w/ feeding. Pt is verbally conversive making her wants/needs known. Pt has not been wanting to eat d/t N/V but does drink [place] soda. Dietician following.      SLP Plan  Continue with current plan of care     Recommendations  Diet recommendations: Dysphagia 3 (mechanical soft);Thin liquid Liquids provided via: Cup;Straw Medication Administration: Whole meds with puree Supervision: Patient able to self feed (setup at meals) Compensations: Minimize environmental distractions;Slow rate;Small sips/bites;Follow solids with liquid Postural Changes and/or Swallow Maneuvers: Seated upright 90 degrees             General recommendations:  (dietician following) Oral Care Recommendations: Oral care BID;Staff/trained caregiver to provide oral care Follow up Recommendations:  (TBD) Plan: Continue with current plan of care     GO               Orinda Kenner, [place], CCC-SLP  Watson,Katherine 03/02/2015, 4:01 PM

## 2015-03-02 NOTE — Progress Notes (Signed)
Pt called RN stating "I think I'm having a stroke", c/o nausea and headache 10/10 that feel similar to her headache during previous stroke. Upon assessment VSS, neuro check same as previous baseline. MD Dr. Marcille Blanco notified. RN administered oxycodone IR and zofran. Will continue to monitor. Rachael Fee, RN

## 2015-03-02 NOTE — Progress Notes (Signed)
PT Cancellation Note  Patient Details Name: Emily Kidd MRN: [id number] DOB: 07-19-1950   Cancelled Treatment:    Reason Eval/Treat Not Completed: Medical issues which prohibited therapy (Pt coughing up blood (nursing notified and came immediately to assess)).  Nursing recommending holding PT at this time.  Palliative Care consult also pending.  Will re-attempt PT treatment session at a later date/time as medically appropriate.   Raquel Sarna Adarryl Goldammer 03/02/2015, 12:36 PM Leitha Bleak, [place]

## 2015-03-02 NOTE — Progress Notes (Signed)
Patient spitting up small blood clots.  Dr. Genevive Bi is aware and states she has done this for past few months.  Continue to monitor.

## 2015-03-02 NOTE — Progress Notes (Signed)
Poor appetite but no nausea.  Oxygen sats without oxygen in the high 50s but mid 90s on nasal cannulae  Wounds are clean and dry.  Dressings intact.  Lungs are somewhat better today on the left with more air entry Heart regular  Will ask Social Services and Palliative Care to consult on case.  This elderly woman with significant physical limitations lives with her mother who is not able to care for her.  She has declined chemotherapy but would take "a pill" if that was offered - the final path is still pending and Oncology is awaiting some genetic studies.  I do not believe she can go home as she is now without significant help.    [employer].

## 2015-03-02 NOTE — Clinical Social Work Note (Signed)
Clinical Social Work Assessment  Patient Details  Name: Emily Kidd MRN: [id number] Date of Birth: 1950-07-30  Date of referral:  03/02/15               Reason for consult:  Discharge Planning                Permission sought to share information with:  Family Supports Permission granted to share information::  Yes, Verbal Permission Granted  Name::        Agency::     Relationship::   Racheal Patches (Mother) & Armanda Heritage ([place]) )  Contact Information:     Housing/Transportation Living arrangements for the past 2 months:  [place] of Information:  Patient, Parent Patient Interpreter Needed:  None Criminal Activity/Legal Involvement Pertinent to Current Situation/Hospitalization:  No - Comment as needed Significant Relationships:  Other Family Members, Parents (Racheal Patches (Mother) & Armanda Heritage ([place]) ) Lives with:  Parents Racheal Patches (Mother) ) Do you feel safe going back to the place where you live?  Yes Need for family participation in patient care:  Yes (Comment) Racheal Patches (Mother) & Armanda Heritage [facility]) )  Care giving concerns:  PT is recommending SNF placement at discharge.   Social Worker assessment / plan: CSW met with patient at bedside. Patient was alert and oriented. CSW explained her role. CSW and patient discussed PT recommendation. Patient is agreeable to SNF placement. Preference [facility]. CSW explained to patient that [facility] is contracted with [facility]. She stated she's like CSW to ask if she can go to [facility]. CSW informed patient that SNF placement will be at [facility] if she decides to go. CSW provided a SNF list to patient per patient's request. CSW explained to patient again that [facility] contracts through [facility]. Patient stated she understood. Patient is agreeable to SNF placement at [facility].   FL2/ PASRR Completed. CSW will readdress SNF placement at [facility] tomorrow to ensure that patient understands that her insurance only covers [facility] STR placement. CSW will continue to  follow and assist as needed.   Employment status:    Insurance information:  Other (Comment Required), Medicaid In State ([facility] ) PT Recommendations:  [place] / Referral to community resources:  [place]  Patient/Family's Response to care:  Patient is agreeable to SNF placement.   Patient/Family's Understanding of and Emotional Response to Diagnosis, Current Treatment, and Prognosis:  Patient understands CSW's role. She was appreciative to CSW's assistance.  Emotional Assessment Appearance:  Appears stated age Attitude/Demeanor/Rapport:   (None ) Affect (typically observed):  Accepting, Calm, Pleasant Orientation:  Oriented to Self, Oriented to Place, Oriented to  Time, Oriented to Situation Alcohol / Substance use:  Not Applicable Psych involvement (Current and /or in the community):  No (Comment)  Discharge Needs  Concerns to be addressed:  Discharge Planning Concerns Readmission within the last 30 days:    Current discharge risk:  Chronically ill Barriers to Discharge:  Continued Medical Work up   [employer], LCSW 03/02/2015, 4:52 PM

## 2015-03-03 LAB — GLUCOSE, CAPILLARY
GLUCOSE-CAPILLARY: 91 mg/dL (ref 65–99)
GLUCOSE-CAPILLARY: 92 mg/dL (ref 65–99)
Glucose-Capillary: 144 mg/dL — ABNORMAL HIGH (ref 65–99)

## 2015-03-03 NOTE — Care Management (Signed)
Patient is agreeable for consideration of pursuing LTAC.  Preference is [facility].  Notified Seth Bake with [facility].  Patient verbalizes understanding that this would have to be approved by [facility].  As long as patient requires HFNC, she can not discharge to SNF or home.

## 2015-03-03 NOTE — Progress Notes (Signed)
Patient ID: Emily Kidd, female   DOB: October 16, 1950, 65 y.o.   MRN: [id number] [facility] PROGRESS NOTE  Emily Kidd SPQ:[id number] DOB: 1950/09/27 DOA: 02/24/2015 PCP: [facility]  HPI/Subjective:  Still on high flow oxygen, patient denies any other complaint   Objective: Filed Vitals:   03/03/15 0219 03/03/15 0605  BP:  131/70  Pulse: 100 99  Temp:  98.6 F (37 C)  Resp: 18     Filed Weights   02/24/15 0909  Weight: 73.483 kg (162 lb)    ROS: Review of Systems  Constitutional: Negative for fever and chills.  Eyes: Negative for blurred vision.  Respiratory: Positive for cough and resolved shortness of breath.   Cardiovascular: Denies chest pain  Gastrointestinal: Negative for nausea, vomiting, abdominal pain, diarrhea and constipation.  Genitourinary: Negative for dysuria.  Musculoskeletal: Negative for joint pain.  Neurological: Negative for dizziness and headaches.   Exam: Physical Exam  Constitutional: She is oriented to person, place, and time.  HENT:  Nose: No mucosal edema.  Mouth/Throat: No oropharyngeal exudate or posterior oropharyngeal edema.  Eyes: Conjunctivae, EOM and lids are normal. Pupils are equal, round, and reactive to light.  Neck: No JVD present. Carotid bruit is not present. No edema present. No thyroid mass and no thyromegaly present.  Cardiovascular: S1 normal and S2 normal.  Exam reveals no gallop.   Murmur heard.  Systolic murmur is present with a grade of 3/6  Pulses:      Dorsalis pedis pulses are 2+ on the right side, and 2+ on the left side.  Respiratory: No respiratory distress. She has decreased breath sounds in the left lung occasional rhonchi GI: Soft. Bowel sounds are normal. There is no tenderness.  Musculoskeletal:       Right ankle: She exhibits swelling.  Lymphadenopathy:    She has no cervical adenopathy.  Neurological: She is alert and oriented to person, place, and time.  Left-sided paralysis   Skin: Skin is warm. No rash noted. Nails show no clubbing.  Psychiatric: She has a normal mood and affect.      Data Reviewed: Basic Metabolic Panel:  Recent Labs Lab 02/25/15 0523 02/26/15 1019 02/26/15 2039 02/28/15 0456  NA 152* 143 141 147*  K 4.1 3.7 4.2 3.7  CL 117* 111 110 110  CO2 '28 26 22 '$ 32  GLUCOSE 151* 166* 152* 106*  BUN <5* 6 6 <5*  CREATININE 0.57 0.58 0.62 0.52  CALCIUM 8.4* 8.2* 8.2* 8.3*   Liver Function Tests:  Recent Labs Lab 02/25/15 0523  AST 17  ALT 13*  ALKPHOS 101  BILITOT 0.8  PROT 7.0  ALBUMIN 3.1*   CBC:  Recent Labs Lab 02/25/15 0523 02/26/15 2039 02/28/15 0456  WBC 19.0* 17.4* 12.0*  HGB 11.8* 11.7* 9.8*  HCT 37.5 38.4 31.5*  MCV 81.9 82.5 81.4  PLT 427 319 300   CBG:  Recent Labs Lab 03/02/15 0734 03/02/15 1125 03/02/15 1607 03/03/15 0826 03/03/15 1149  GLUCAP 105* 169* 131* 91 92    Recent Results (from the past 240 hour(s))  Anaerobic culture     Status: None   Collection Time: 02/24/15 12:04 PM  Result Value Ref Range Status   Specimen Description LUNG  Final   Special Requests NONE  Final   Culture NO ANAEROBES ISOLATED  Final   Report Status 02/28/2015 FINAL  Final  Fungus Culture with Smear     Status: None (Preliminary result)   Collection Time: 02/24/15  12:04 PM  Result Value Ref Range Status   Specimen Description LUNG  Final   Special Requests NONE  Final   Culture NO FUNGUS ISOLATED;CULTURE IN PROGRESS FOR 7 DAYS  Final   Report Status PENDING  Incomplete  Tissue culture     Status: None   Collection Time: 02/24/15 12:04 PM  Result Value Ref Range Status   Specimen Description LUNG  Final   Special Requests NONE  Final   Gram Stain MODERATE WBC SEEN NO ORGANISMS SEEN   Final   Culture NO GROWTH 3 DAYS  Final   Report Status 02/28/2015 FINAL  Final     Studies: No results found.  Scheduled Meds: . antiseptic oral rinse  7 mL Mouth Rinse Q2H while awake  . atorvastatin  40 mg Oral  QHS  . bisacodyl  10 mg Oral Daily  . budesonide (PULMICORT) nebulizer solution  0.5 mg Nebulization BID  . carvedilol  3.125 mg Oral BID  . chlorhexidine gluconate  15 mL Mouth Rinse BID  . docusate sodium  100 mg Oral BID  . feeding supplement (ENSURE ENLIVE)  237 mL Oral TID  . insulin aspart  0-9 Units Subcutaneous TID WC  . ipratropium-albuterol  3 mL Nebulization Q6H WA  . lidocaine  1 patch Transdermal Q24H  . midodrine  5 mg Oral TID WC  . oxybutynin  5 mg Oral QHS  . pantoprazole  40 mg Oral BH-q7a  . pregabalin  200 mg Oral BID  . traZODone  100 mg Oral QHS  . venlafaxine XR  75 mg Oral Daily   Continuous Infusions: . dextrose Stopped (03/01/15 1900)    Assessment/Plan:  1. Acute respiratory failure with hypoxia. Continue supportive care with oxygen therapy . Wean oxygen as tolerated 2. Chest pain status post thoracoscopy and lung biopsy for pulmonary hemorrhage. When necessary IV morphine and oral oxycodone. Currently pain under control 3. Suspected pneumonia  Off antibiotics 4. Hypotension  now resolved on midrodrine 5. Type 2 diabetes mellitus on sliding scale blood glucose stable 6. History of CVA with left-sided paralysis- off anticoagulation at this point. Resume once okay per surgery 7. Hyperlipidemia unspecified on atorvastatin 8. CODE STATUS DO NOT RESUSCITATE patient's primary care provider Dr. Ovid Curd bought her DO NOT RESUSCITATE form.   Code Status:     Code Status Orders        Start     Ordered   02/24/15 1530  Full code   Continuous     02/24/15 1529    Code Status History    Date Active Date Inactive Code Status Order ID Comments User Context   01/04/2015  8:57 AM 01/06/2015  8:46 PM Full Code [id number]  Harrie Foreman, MD Inpatient   12/15/2014  7:01 PM 12/16/2014  4:16 PM Full Code [id number]  Demetrios Loll, MD Inpatient   11/16/2014  5:48 AM 11/21/2014  4:42 PM Full Code [id number]  Verlin Dike, RN Inpatient   11/16/2014  4:31 AM  11/16/2014  5:47 AM DNR [id number]  Quintella Baton, MD Inpatient    Advance Directive Documentation        Most Recent Value   Type of Advance Directive  Healthcare Power of Attorney, Living will   Pre-existing out of facility DNR order (yellow form or pink MOST form)     "MOST" Form in Place?       Disposition Plan: to be determined  Consultants:  Pulmonary  Cardiothoracic surgery  Procedures:  Thoracoscopy lung  biopsy and chest tube  Antibiotics:  Vancomycin  Meropenem  Time spent: 30 minutes,  Anselm Aumiller, [facility] [place] [facility]

## 2015-03-03 NOTE — Progress Notes (Signed)
Physical Therapy Treatment Patient Details Name: Emily Kidd MRN: [id number] DOB: Oct 31, 1950 Today's Date: 03/03/2015    History of Present Illness Pt is a 65 y.o. female s/p L thoracoscopy with biopsy L upper and L lower lobes secondary to pulmonary hemorrhage (pt with several weak h/o hemoptysis).  Pt initially on BiPAP post-op and now on HFNC.  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    PT Comments    Limited treatment session d/t pt c/o nausea (and recent emesis) and declining further activity (pt declining for PT to notify nursing regarding nausea symptoms and pt reported she would continue to sip on ginger ale to feel better).  Pt did participate in a few LE ex's in bed but declined to get OOB d/t nausea.  Will continue to progress pt per pt tolerance with ex's and progress functional mobility (progress back to sitting edge of bed with assist and potentially trial transfer to chair if appropriate).   Follow Up Recommendations  SNF     Equipment Recommendations       Recommendations for Other Services       Precautions / Restrictions Precautions Precautions: Fall Precaution Comments: Aspiration; L AKA; L sided weakness Restrictions Weight Bearing Restrictions: No    Mobility  Bed Mobility  Pt refused all functional mobility d/t nausea.                Transfers                    Ambulation/Gait                 Stairs            Wheelchair Mobility    Modified Rankin (Stroke Patients Only)       Balance                                    Cognition Arousal/Alertness: Awake/alert Behavior During Therapy: WFL for tasks assessed/performed Overall Cognitive Status: Within Functional Limits for tasks assessed                      Exercises   Performed semi-supine R LE therapeutic exercise x 10 reps:  Ankle pumps (AROM); quad sets x3 second holds (AROM);  glute squeezes x3 second holds (AROM); SAQ's (AROM); heelslides (AROM).  Pt required vc's and tactile cues for correct technique with exercises.  Also performed R knee stretching (into extension) semi supine in bed 3x30 seconds d/t R knee appearing to get tight into flexed position.     General Comments   Nursing cleared pt for participation in physical therapy.  Pt agreeable to limited PT session d/t reporting throwing up recently.       Pertinent Vitals/Pain Pain Assessment: No/denies pain  Vitals stable and WFL throughout treatment session on HFNC.    Home Living                      Prior Function            PT Goals (current goals can now be found in the care plan section) Acute Rehab PT Goals Patient Stated Goal: to get stronger PT Goal Formulation: With patient Time For Goal Achievement: 03/12/15 Potential to Achieve Goals: Fair Progress towards PT goals: Not progressing toward goals - comment (Limited  to in bed ex's d/t nausea)    Frequency  Min 2X/week    PT Plan Current plan remains appropriate    Co-evaluation             End of Session Equipment Utilized During Treatment: Oxygen Activity Tolerance: Patient limited by fatigue (Limited d/t nausea) Patient left: in bed;with call bell/phone within reach;with bed alarm set     Time: 1111-1130 PT Time Calculation (min) (ACUTE ONLY): 19 min  Charges:  $Therapeutic Exercise: 8-22 mins                    G CodesLeitha Bleak Mar 21, 2015, 4:45 PM Leitha Bleak, [place]

## 2015-03-03 NOTE — Progress Notes (Signed)
Patient has orders for ambulation, has a Left AKA Pt ordered for patient to do exercises in bed. Coughing and deep breathing encouraged. Patient demonstrates and verbalizes understanding

## 2015-03-03 NOTE — Care Management (Signed)
Met with patient who is currently on HFO2. She wants to go to [facility] but apparently patient is open to [facility] services and will assist with discharge planning needs. RNCM will follow along with CSW for discharge planning.

## 2015-03-04 LAB — VIRUS CULTURE

## 2015-03-04 LAB — GLUCOSE, CAPILLARY
GLUCOSE-CAPILLARY: 199 mg/dL — AB (ref 65–99)
GLUCOSE-CAPILLARY: 129 mg/dL — AB (ref 65–99)
GLUCOSE-CAPILLARY: 118 mg/dL — AB (ref 65–99)
Glucose-Capillary: 101 mg/dL — ABNORMAL HIGH (ref 65–99)

## 2015-03-04 MED ORDER — BISACODYL 5 MG PO TBEC: 10.0000 mg | DELAYED_RELEASE_TABLET | Freq: Every day | ORAL | Status: DC

## 2015-03-04 MED ORDER — INSULIN ASPART 100 UNIT/ML ~~LOC~~ SOLN: 0.0000 [IU] | Freq: Three times a day (TID) | SUBCUTANEOUS | Status: AC

## 2015-03-04 MED ORDER — MORPHINE SULFATE (CONCENTRATE) 10 MG /0.5 ML PO SOLN: 5.0000 mg | ORAL | Status: AC | PRN

## 2015-03-04 MED ORDER — ACETAMINOPHEN 325 MG PO TABS: 650.0000 mg | ORAL_TABLET | Freq: Four times a day (QID) | ORAL | Status: DC | PRN

## 2015-03-04 MED ORDER — MORPHINE SULFATE (CONCENTRATE) 10 MG/0.5ML PO SOLN
5.0000 mg | ORAL | Status: DC | PRN
  Filled 2015-03-04: qty 1

## 2015-03-04 MED ORDER — CARBOXYMETHYLCELLULOSE SODIUM 0.5 % OP SOLN: 1.0000 [drp] | Freq: Three times a day (TID) | OPHTHALMIC | Status: AC | PRN

## 2015-03-04 MED ORDER — PREGABALIN 200 MG PO CAPS: 200.0000 mg | ORAL_CAPSULE | Freq: Two times a day (BID) | ORAL | Status: AC

## 2015-03-04 MED ORDER — MIDODRINE HCL 5 MG PO TABS: 5.0000 mg | ORAL_TABLET | Freq: Three times a day (TID) | ORAL | Status: AC

## 2015-03-04 MED ORDER — MORPHINE SULFATE (CONCENTRATE) 10 MG/0.5ML PO SOLN
5.0000 mg | ORAL | Status: DC | PRN
  Administered 2015-03-05: 5 mg via SUBLINGUAL
  Filled 2015-03-04 (×3): qty 1

## 2015-03-04 MED ORDER — IPRATROPIUM-ALBUTEROL 0.5-2.5 (3) MG/3ML IN SOLN: 3.0000 mL | Freq: Four times a day (QID) | RESPIRATORY_TRACT | Status: AC

## 2015-03-04 MED ORDER — ENSURE ENLIVE PO LIQD: 237.0000 mL | Freq: Three times a day (TID) | ORAL | Status: AC

## 2015-03-04 MED ORDER — BISACODYL 10 MG RE SUPP: 10.0000 mg | RECTAL | Status: AC | PRN

## 2015-03-04 MED ORDER — IPRATROPIUM-ALBUTEROL 0.5-2.5 (3) MG/3ML IN SOLN: 3.0000 mL | RESPIRATORY_TRACT | Status: DC | PRN

## 2015-03-04 MED ORDER — LORAZEPAM 0.5 MG PO TABS: 0.5000 mg | ORAL_TABLET | ORAL | Status: AC | PRN

## 2015-03-04 MED ORDER — PROCHLORPERAZINE 25 MG RE SUPP: 25.0000 mg | Freq: Two times a day (BID) | RECTAL | Status: AC | PRN

## 2015-03-04 MED ORDER — ACETAMINOPHEN 325 MG PO TABS
650.0000 mg | ORAL_TABLET | Freq: Four times a day (QID) | ORAL | Status: AC | PRN
Start: 2015-03-04 — End: ?

## 2015-03-04 MED ORDER — IPRATROPIUM-ALBUTEROL 0.5-2.5 (3) MG/3ML IN SOLN: 3.0000 mL | RESPIRATORY_TRACT | Status: AC | PRN

## 2015-03-04 NOTE — Progress Notes (Signed)
Patient is stable this shift,HFNC weaned to 4l nasal canula and oxygen saturations range 90-92%,had a large bowel movement,poor diettary intake.

## 2015-03-04 NOTE — Care Management Note (Signed)
Case Management Note  Patient Details  Name: Emily Kidd MRN: [id number] Date of Birth: 07-13-1950  Subjective/Objective:   Have spoke with Larene Beach at [facility] multiple times.  She is wondering if hospice home has been offered. TC to Dr. Megan Salon  Who states patient is at the top of her list to see today. Updated [facility]. Following.                Action/Plan:   Expected Discharge Date:                  Expected Discharge Plan:     In-House Referral:     Discharge planning Services     Post Acute Care Choice:    Choice offered to:     DME Arranged:    DME Agency:     HH Arranged:    [place] Agency:     Status of Service:     Medicare Important Message Given:  Yes Date Medicare IM Given:    Medicare IM give by:    Date Additional Medicare IM Given:    Additional Medicare Important Message give by:     If discussed at [place] of Stay Meetings, dates discussed:    Additional Comments:  Jolly Mango, RN 03/04/2015, 11:52 AM

## 2015-03-04 NOTE — Care Management Note (Signed)
Case Management Note  Patient Details  Name: Emily Kidd MRN: [id number] Date of Birth: Dec 23, 1950  Subjective/Objective:   TC to [facility], spoke with Larene Beach. Informed her that patient is ready for discharge today and remains on high flow [place]. She will be speaking with her PCP and follow up with RNCM as soon as possible.                 Action/Plan:   Expected Discharge Date:                  Expected Discharge Plan:     In-House Referral:     Discharge planning Services     Post Acute Care Choice:    Choice offered to:     DME Arranged:    DME Agency:     HH Arranged:    [place] Agency:     Status of Service:     Medicare Important Message Given:  Yes Date Medicare IM Given:    Medicare IM give by:    Date Additional Medicare IM Given:    Additional Medicare Important Message give by:     If discussed at [place] of Stay Meetings, dates discussed:    Additional Comments:  Jolly Mango, RN 03/04/2015, 9:57 AM

## 2015-03-04 NOTE — Progress Notes (Signed)
CSW was informed that patient is medically stable to discharge today. CSW informed MD Posey Pronto and RN Case Manager that patient cannot discharge to SNF on HFNC. RN Case Manager is currently working on other discharge options for patient. CSW will continue to follow and assist.   Ernest Pine, MSW, [place] Work Department [phone]

## 2015-03-04 NOTE — Progress Notes (Signed)
Patient ID: SHANAY WOOLMAN, female   DOB: May 18, 1950, 65 y.o.   MRN: [id number] [facility] PROGRESS NOTE  LOURIE RETZ [id number] DOB: 1950-08-03 DOA: 02/24/2015 PCP: [place]  HPI/Subjective:  Unable to be weaned off high flow nasal cannula   Objective: Filed Vitals:   03/03/15 2302 03/04/15 0636  BP: 99/71 130/71  Pulse: 94 90  Temp:  98.1 F (36.7 C)  Resp:  22    Filed Weights   02/24/15 0909  Weight: 73.483 kg (162 lb)    ROS: Review of Systems  Constitutional: Negative for fever and chills.  Eyes: Negative for blurred vision.  Respiratory: Positive for cough and resolved shortness of breath.   Cardiovascular: Denies chest pain  Gastrointestinal: Negative for nausea, vomiting, abdominal pain, diarrhea and constipation.  Genitourinary: Negative for dysuria.  Musculoskeletal: Negative for joint pain.  Neurological: Negative for dizziness and headaches.   Exam: Physical Exam  Constitutional: She is oriented to person, place, and time.  HENT:  Nose: No mucosal edema.  Mouth/Throat: No oropharyngeal exudate or posterior oropharyngeal edema.  Eyes: Conjunctivae, EOM and lids are normal. Pupils are equal, round, and reactive to light.  Neck: No JVD present. Carotid bruit is not present. No edema present. No thyroid mass and no thyromegaly present.  Cardiovascular: S1 normal and S2 normal.  Exam reveals no gallop.   Murmur heard.  Systolic murmur is present with a grade of 3/6  Pulses:      Dorsalis pedis pulses are 2+ on the right side, and 2+ on the left side.  Respiratory: No respiratory distress. She has decreased breath sounds in the left lung occasional rhonchi GI: Soft. Bowel sounds are normal. There is no tenderness.  Musculoskeletal:       Right ankle: She exhibits swelling.  Lymphadenopathy:    She has no cervical adenopathy.  Neurological: She is alert and oriented to person, place, and time.  Left-sided paralysis  Skin:  Skin is warm. No rash noted. Nails show no clubbing.  Psychiatric: She has a normal mood and affect.      Data Reviewed: Basic Metabolic Panel:  Recent Labs Lab 02/26/15 1019 02/26/15 2039 02/28/15 0456  NA 143 141 147*  K 3.7 4.2 3.7  CL 111 110 110  CO2 26 22 32  GLUCOSE 166* 152* 106*  BUN 6 6 <5*  CREATININE 0.58 0.62 0.52  CALCIUM 8.2* 8.2* 8.3*   Liver Function Tests: No results for input(s): AST, ALT, ALKPHOS, BILITOT, PROT, ALBUMIN in the last 168 hours. CBC:  Recent Labs Lab 02/26/15 2039 02/28/15 0456  WBC 17.4* 12.0*  HGB 11.7* 9.8*  HCT 38.4 31.5*  MCV 82.5 81.4  PLT 319 300   CBG:  Recent Labs Lab 03/02/15 1607 03/03/15 0826 03/03/15 1149 03/03/15 1539 03/04/15 0823  GLUCAP 131* 91 92 144* 199*    Recent Results (from the past 240 hour(s))  Anaerobic culture     Status: None   Collection Time: 02/24/15 12:04 PM  Result Value Ref Range Status   Specimen Description LUNG  Final   Special Requests NONE  Final   Culture NO ANAEROBES ISOLATED  Final   Report Status 02/28/2015 FINAL  Final  Fungus Culture with Smear     Status: None (Preliminary result)   Collection Time: 02/24/15 12:04 PM  Result Value Ref Range Status   Specimen Description LUNG  Final   Special Requests NONE  Final   Culture NO FUNGUS ISOLATED AFTER  8 DAYS  Final   Report Status PENDING  Incomplete  Tissue culture     Status: None   Collection Time: 02/24/15 12:04 PM  Result Value Ref Range Status   Specimen Description LUNG  Final   Special Requests NONE  Final   Gram Stain MODERATE WBC SEEN NO ORGANISMS SEEN   Final   Culture NO GROWTH 3 DAYS  Final   Report Status 02/28/2015 FINAL  Final     Studies: No results found.  Scheduled Meds: . antiseptic oral rinse  7 mL Mouth Rinse Q2H while awake  . atorvastatin  40 mg Oral QHS  . bisacodyl  10 mg Oral Daily  . budesonide (PULMICORT) nebulizer solution  0.5 mg Nebulization BID  . carvedilol  3.125 mg Oral BID   . chlorhexidine gluconate  15 mL Mouth Rinse BID  . docusate sodium  100 mg Oral BID  . feeding supplement (ENSURE ENLIVE)  237 mL Oral TID  . insulin aspart  0-9 Units Subcutaneous TID WC  . ipratropium-albuterol  3 mL Nebulization Q6H WA  . lidocaine  1 patch Transdermal Q24H  . midodrine  5 mg Oral TID WC  . oxybutynin  5 mg Oral QHS  . pantoprazole  40 mg Oral BH-q7a  . pregabalin  200 mg Oral BID  . traZODone  100 mg Oral QHS  . venlafaxine XR  75 mg Oral Daily   Continuous Infusions:    Assessment/Plan:  1. Acute respiratory failure with hypoxia. Continue supportive care with oxygen therapy . Wean oxygen as tolerated unfortunately unable to wean at this point would benefit from LTAC 2. Chest pain status post thoracoscopy and lung biopsy for pulmonary hemorrhage. When necessary IV morphine and oral oxycodone. Currently pain under control 3. Suspected pneumonia  Off antibiotics 4. Hypotension  now resolved on midrodrine 5. Type 2 diabetes mellitus on sliding scale blood glucose stable 6. History of CVA with left-sided paralysis- off anticoagulation at this point. Resume once okay per surgery 7. Hyperlipidemia unspecified on atorvastatin 8. Lung cancer: Was seen by oncology will need outpatient follow-up CODE STATUS DO NOT RESUSCITATE  From a medical standpoint can be discharged to [facility]   Code Status:     Code Status Orders        Start     Ordered   02/24/15 1530  Full code   Continuous     02/24/15 1529    Code Status History    Date Active Date Inactive Code Status Order ID Comments User Context   01/04/2015  8:57 AM 01/06/2015  8:46 PM Full Code [id number]  Harrie Foreman, MD Inpatient   12/15/2014  7:01 PM 12/16/2014  4:16 PM Full Code [id number]  Demetrios Loll, MD Inpatient   11/16/2014  5:48 AM 11/21/2014  4:42 PM Full Code [id number]  Verlin Dike, RN Inpatient   11/16/2014  4:31 AM 11/16/2014  5:47 AM DNR [id number]  Quintella Baton, MD Inpatient    Advance  Directive Documentation        Most Recent Value   Type of Advance Directive  Healthcare Power of Attorney, Living will   Pre-existing out of facility DNR order (yellow form or pink MOST form)     "MOST" Form in Place?       Disposition Plan: to be determined  Consultants:  Pulmonary  Cardiothoracic surgery  Procedures:  Thoracoscopy lung biopsy and chest tube  Antibiotics:  Vancomycin  Meropenem  Time spent: 30 minutes,  Jerald Hennington,  [facility]

## 2015-03-04 NOTE — Care Management Note (Signed)
Case Management Note  Patient Details  Name: Emily Kidd MRN: [id number] Date of Birth: [birth year]  Subjective/Objective:  Patient now on 5L. Patient will need to be on 4L. Requested primary RNwork towards this goal.  Case discussed with Dr. Megan Salon and [place].                   Action/Plan:   Expected Discharge Date:                  Expected Discharge Plan:     In-House Referral:     Discharge planning Services     Post Acute Care Choice:    Choice offered to:     DME Arranged:    DME Agency:     HH Arranged:    [place] Agency:     Status of Service:     Medicare Important Message Given:  Yes Date Medicare IM Given:    Medicare IM give by:    Date Additional Medicare IM Given:    Additional Medicare Important Message give by:     If discussed at [place] of Stay Meetings, dates discussed:    Additional Comments:  Jolly Mango, RN 03/04/2015, 2:42 PM

## 2015-03-04 NOTE — Care Management Important Message (Signed)
Important Message  Patient Details  Name: Emily Kidd MRN: [id number] Date of Birth: Nov 30, 1950   Medicare Important Message Given:  Yes    Juliann Pulse A Ranulfo Kall 03/04/2015, 11:15 AM

## 2015-03-04 NOTE — Progress Notes (Addendum)
General Information about chest tube requested by facilities considering placement:  Note:  Patient no longer has a chest tube.  Note that this was already documented in Dr Melody Haver note from Feb 28, 2014, when he documented removing her chest tube on that date.    Colleen Can, MD

## 2015-03-04 NOTE — Progress Notes (Signed)
Still unable to be weaned from high flow nasal oxygen.  Wounds are all clean and dry  Better air entry into left chest.  Social Services and Case Management working on placement.

## 2015-03-04 NOTE — Care Management Note (Signed)
Case Management Note  Patient Details  Name: KAYMARIE WYNN MRN: [id number] Date of Birth: Mar 21, 1950  Subjective/Objective:    Spoke with  Larene Beach at [facility]. [facility] came to visit patient. Patient denied knowing anything about [facility] and told [facility] she did not want to go to [facility].  Palliative is suppose to evaluate patient this afternoon. Patient remains on high flow [place]. Will follow up after palliative consult. Updated CSW that patient is agreeable to [facility] if she meets criteria.              Action/Plan:   Expected Discharge Date:                  Expected Discharge Plan:     In-House Referral:     Discharge planning Services     Post Acute Care Choice:    Choice offered to:     DME Arranged:    DME Agency:     HH Arranged:    [place] Agency:     Status of Service:     Medicare Important Message Given:  Yes Date Medicare IM Given:    Medicare IM give by:    Date Additional Medicare IM Given:    Additional Medicare Important Message give by:     If discussed at [place] of Stay Meetings, dates discussed:    Additional Comments:  Jolly Mango, RN 03/04/2015, 1:06 PM

## 2015-03-04 NOTE — Consult Note (Signed)
Palliative Medicine Inpatient Consult Note   Name: Emily Kidd Date: 03/04/2015 MRN: [id number]  DOB: Jun 30, 1950  Referring Physician: Nestor Lewandowsky, MD  Palliative Care consult requested for this 65 y.o. female for goals of medical therapy in patient with acute on chronic respiratory failure with hypoxemia.  She was admitted on 02/24/15 with several weeks history of hemoptysis with a CT suggestive of an interstitial process. She was admitted by cardiothoracic surgeons and had a biopsy of left upper and left lower lobes of lung.  Pt has recovered from the surgery but apparently has not been weanable from Hi Flow oxygen.  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  She has a very poor appetite and is eating less than 50% of meals.  Albumin is 3.1.   She has declined chemo BUT would take a 'pill' type chemo if available to her. There is a genetic study pending to learn if she is eligible for that option.   She is a [facility] client and [facility]ACE has been involved and even talking with pt about options.  Unfortunately, there is a medical reality that often is not appreciated by most. That is the fact that Hi FLow oxygen is NOT something that can be provided outside a hospital setting.  Hospice has figured out a way to rig two tanks together to create Hi Flow but it is low Hi Flow at no more than 15 Liters per minute and no more than 35% FIO2.    TODAY'S DISCUSSIONS AND PLANS: 1.  Pt was able to be successfully taken off of Hi Flow and now she is on 5 LPM O2.  And she looks pretty good with this. She has been on this for 2 hrs.  She needs to be off of Hi Flow for 24 hrs before she can be thought to be stable off Hi Flow.  2.  I have talked with pt and she DOES NOT WANT TO EVER BE ON HI FLOW AGAIN.  SHE ALSO ONLY WANTS BIPAP FOR 3 DAYS NO MORE.   Her lung cancer is going to progress and  she will at some point again become quite hypoxic. But she does not want to risk being maintained on oxygen delivery systems that will make her have to go to an LTAC or live in a hospital. She was informed that she might pass away soon because of her lung cancer that is in two places (Left upp and Left lower lung zones).  These extra provisions to not give HI FLOW and LIMIT BIPAP to 3 days (no more) can be handwritten into a new MOST form.    3.  I have been updated [facility] physician, Dr Ovid Curd, and [facility] social worker.  I have updated pts care mgr and pts Education officer, museum. Pt wants to go to [facility].  She does need to get out of bed (she has a left AKA and until recently had a chest tube --so she is only now able to be gotten out of bed, etc).  She still has hemoptysis and I can see dried blood around her lips. [facility] staff will need to know she is going to do this and it is hoped that they can provide end of life care/ Hospice care.  Pt is very appropriate for end of life care as she has a prognosis of living less than 6 months.  If the hemoptysis worsens, she could die much sooner.    4.  I will adjust her Discharge Meds with attending permission.  She will not have any benefit from a statin and similar meds given end of life status.  She will benefit from symptom management types of meds that are easily given in a facility setting.  I will update attending.     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  Hypertension Dyslipidemia    COPD (chronic obstructive pulmonary disease) ([place]) Former Smoker     Pneumonia    Sepsis ([place])    Lung mass     Diabetes mellitus without complication (HCC)     Dysrhythmia     TACHYCARDIA   GERD (gastroesophageal reflux disease)    Mitral regurgitation    Carotid artery stenosis    Muscle spasms of neck    TIA (transient ischemic attack)    Anxiety    Hyperlipidemia    Major depressive disorder (HCC)    Chronic pain syndrome    Drug abuse in remission    Vertigo    Stable angina (HCC)    Neuropathy (HCC)    Left-sided weakness     FROM TIA   Coronary artery disease    Shortness of breath dyspnea        Constipation      Hypernatremia (Na 147)      Leukocytosis (WBC now down to 12)      Anemia of unclear etiology with Hgb down from 11 to 9.8)      Edentulous status:  Diet is currently mech soft with thin liquids --tolerating per SLP      Poor IV access (ABX were given IM for a period of time)        REVIEW OF SYSTEMS:  Patient is not able to provide ROS in detail due to hypoxia  SPIRITUAL SUPPORT SYSTEM: not very substantial --has aging mother at home .  SOCIAL HISTORY:  reports that she quit smoking about 6 years ago. She does not have any smokeless tobacco history on file. She reports that she does not drink alcohol or use illicit drugs. Her mother lives in the home with her.  Pt has a h/o drug (opioid) abuse and dependency in the past.  Nephew is   LEGAL DOCUMENTS:  I have located the [facility] document in the paper chart and it lists pt's mother first and then a cousin and then a nephew.  Pt can make her own decisions at this time, but that will likely change soon as her cancer worsens, etc.  Nephew has mentioned himself as "POA" but no one is currently acting in that role as she makes her own decisions thus far. I have noted a new MOST form.  I have mentioned to Dr. Ovid Curd it would be good to have a new one that mentions the NO HI FLOW oxygen request along with the BIPAP for no Longer than 3 days.   There is also a golden DNR form (the  MOST form serves as a DNR form but many do not realize this).     CODE STATUS: DNR / DNI  PAST MEDICAL HISTORY: Past Medical History  Diagnosis Date  . Hypertension   . COPD (chronic obstructive pulmonary disease) ([place])   . Pneumonia   . Sepsis ([place])   . Lung mass   . Diabetes mellitus without complication ([place])   . Dysrhythmia     TACHYCARDIA  . GERD (gastroesophageal reflux disease)   . Mitral regurgitation   . Carotid artery stenosis   . Muscle spasms of neck   . TIA (transient ischemic attack)   . Anxiety   . Hyperlipidemia   . Major depressive disorder ([place])   . Chronic pain syndrome   . Drug abuse in remission   . Vertigo   . Stable angina (HCC)   . Neuropathy ([place])   . Left-sided weakness     FROM TIA  . Coronary artery disease   . Shortness of breath dyspnea     PAST SURGICAL HISTORY:  Past Surgical History  Procedure Laterality Date  . Endobronchial ultrasound N/A 11/24/2014    Procedure: ENDOBRONCHIAL ULTRASOUND;  Surgeon: Flora Lipps, MD;  Location: [facility] ORS;  Service: Cardiopulmonary;  Laterality: N/A;  . Above knee amputation Left   . Endobronchial ultrasound N/A 12/15/2014    Procedure: ENDOBRONCHIAL ULTRASOUND;  Surgeon: Flora Lipps, MD;  Location: [facility] ORS;  Service: Cardiopulmonary;  Laterality: N/A;  . Video assisted thoracoscopy Left 02/24/2015    Procedure: Preoperative bronchoscopy, left thoracoscopy and lung biopsy;  Surgeon: Nestor Lewandowsky, MD;  Location: [facility] ORS;  Service: Thoracic;  Laterality: Left;    ALLERGIES:  is allergic to azithromycin and penicillins.  MEDICATIONS:  Current Facility-Administered Medications  Medication Dose Route Frequency Provider Last Rate Last Dose  . acetaminophen (TYLENOL) tablet 650 mg  650 mg Oral Q6H PRN Loletha Grayer, MD   650 mg at 03/02/15 0626  . albuterol (PROVENTIL) (2.5 MG/3ML) 0.083% nebulizer solution 2.5 mg  2.5 mg Nebulization Q4H PRN Nestor Lewandowsky, MD   2.5 mg at 02/25/15 0314  . antiseptic oral  rinse solution (CORINZ)  7 mL Mouth Rinse Q2H while awake Loletha Grayer, MD   7 mL at 03/04/15 1103  . atorvastatin (LIPITOR) tablet 40 mg  40 mg Oral QHS Nestor Lewandowsky, MD   40 mg at 03/03/15 2257  . bisacodyl (DULCOLAX) EC tablet 10 mg  10 mg Oral Daily Nestor Lewandowsky, MD   10 mg at 03/04/15 0948  . budesonide (PULMICORT) nebulizer solution 0.5 mg  0.5 mg Nebulization BID Flora Lipps, MD   0.5 mg at 03/04/15 0837  . carvedilol (COREG) tablet 3.125 mg  3.125 mg Oral BID Nestor Lewandowsky, MD   3.125 mg at 03/04/15 0944  . chlorhexidine gluconate (PERIDEX) 0.12 % solution 15 mL  15 mL Mouth Rinse BID Loletha Grayer, MD   15 mL at 03/04/15 0800  . docusate sodium (COLACE) capsule 100 mg  100 mg Oral BID Loletha Grayer, MD   100 mg at 03/04/15 0944  . feeding supplement (ENSURE ENLIVE) (ENSURE ENLIVE) liquid 237 mL  237 mL Oral TID Vishal Mungal, MD   237 mL at 03/04/15 1204  . HYDROcodone-acetaminophen (NORCO/VICODIN) 5-325 MG per tablet 1-2 tablet  1-2 tablet Oral Q4H PRN Nestor Lewandowsky, MD   1 tablet at 03/04/15 0031  . insulin aspart (novoLOG) injection 0-9 Units  0-9 Units Subcutaneous TID WC Dustin Flock, MD   1 Units at 03/04/15 1203  .  ipratropium-albuterol (DUONEB) 0.5-2.5 (3) MG/3ML nebulizer solution 3 mL  3 mL Nebulization Q6H WA Flora Lipps, MD   3 mL at 03/04/15 0837  . lidocaine (LIDODERM) 5 % 1 patch  1 patch Transdermal Q24H Vishal Mungal, MD   1 patch at 03/03/15 1421  . midodrine (PROAMATINE) tablet 5 mg  5 mg Oral TID WC Dustin Flock, MD   5 mg at 03/04/15 1203  . morphine 2 MG/ML injection 1-2 mg  1-2 mg Intravenous Q1H PRN Nestor Lewandowsky, MD   2 mg at 02/26/15 1606  . nitroGLYCERIN (NITROSTAT) SL tablet 0.4 mg  0.4 mg Sublingual Q5 min PRN Nestor Lewandowsky, MD      . ondansetron [facility]) injection 4 mg  4 mg Intravenous Q6H PRN Nestor Lewandowsky, MD   4 mg at 02/27/15 1618  . ondansetron (ZOFRAN) tablet 4 mg  4 mg Oral Q6H PRN Harrie Foreman, MD   4 mg at 03/03/15 0853  . oxybutynin  (DITROPAN-XL) 24 hr tablet 5 mg  5 mg Oral QHS Nestor Lewandowsky, MD   5 mg at 03/03/15 2257  . oxyCODONE (Oxy IR/ROXICODONE) immediate release tablet 5 mg  5 mg Oral Q4H PRN Loletha Grayer, MD   5 mg at 03/02/15 2001  . pantoprazole (PROTONIX) EC tablet 40 mg  40 mg Oral Sammuel Cooper, MD   40 mg at 03/04/15 0830  . pentafluoroprop-tetrafluoroeth (GEBAUERS) aerosol   Topical PRN Quintella Baton, MD   30 application at 65/78/46 2115  . polyethylene glycol (MIRALAX / GLYCOLAX) packet 17 g  17 g Oral Daily PRN Loletha Grayer, MD   17 g at 03/03/15 1427  . polyvinyl alcohol (LIQUIFILM TEARS) 1.4 % ophthalmic solution 1 drop  1 drop Both Eyes PRN Quintella Baton, MD   1 drop at 03/02/15 0627  . pregabalin (LYRICA) capsule 200 mg  200 mg Oral BID Loletha Grayer, MD   200 mg at 03/04/15 0944  . traZODone (DESYREL) tablet 100 mg  100 mg Oral QHS Nestor Lewandowsky, MD   100 mg at 03/03/15 2257  . venlafaxine XR (EFFEXOR-XR) 24 hr capsule 75 mg  75 mg Oral Daily Nestor Lewandowsky, MD   75 mg at 03/04/15 9629   Facility-Administered Medications Ordered in Other Encounters  Medication Dose Route Frequency Provider Last Rate Last Dose  . lidocaine (XYLOCAINE) 2 % jelly 1 application  1 application Topical Once Flora Lipps, MD        Vital Signs: BP 130/60 mmHg  Pulse 89  Temp(Src) 97.8 F (36.6 C) (Oral)  Resp 18  Ht '5\' 4"'$  (1.626 m)  Wt 73.483 kg (162 lb)  BMI 27.79 kg/m2  SpO2 96% Filed Weights   02/24/15 0909  Weight: 73.483 kg (162 lb)    Estimated body mass index is 27.79 kg/(m^2) as calculated from the following:   Height as of this encounter: '5\' 4"'$  (1.626 m).   Weight as of this encounter: 73.483 kg (162 lb).  PERFORMANCE STATUS (ECOG) : 4 - Bedbound on Hi Flow oxygen  PHYSICAL EXAM:  Left sided weakness (from prior CVA) Left AKA also Alert Large lower lip has crusted dried blood on it She talks in sentences and has no visible use of accessory muscles or conversational dyspnea off of the hi  flow on on 5 lpm o2 via [place] EOMI OP clear except for dried blood on lips No JVD or Tm Hrt rrr no mgr Lungs cta anteriorly except for distant lung sounds in LUL and LLL  Abd soft and NT AKA left leg Skin warm and dry  LABS: CBC:    Component Value Date/Time   WBC 12.0* 02/28/2015 0456   WBC 6.7 03/02/2014 1244   HGB 9.8* 02/28/2015 0456   HGB 12.8 03/02/2014 1244   HCT 31.5* 02/28/2015 0456   HCT 40.1 03/02/2014 1244   PLT 300 02/28/2015 0456   PLT 307 03/02/2014 1244   MCV 81.4 02/28/2015 0456   MCV 87 03/02/2014 1244   NEUTROABS 5.0 01/15/2015 1000   NEUTROABS 4.4 06/12/2013 0524   LYMPHSABS 0.9* 01/15/2015 1000   LYMPHSABS 1.4 06/12/2013 0524   MONOABS 0.8 01/15/2015 1000   MONOABS 0.7 06/12/2013 0524   EOSABS 0.2 01/15/2015 1000   EOSABS 0.2 06/12/2013 0524   BASOSABS 0.0 01/15/2015 1000   BASOSABS 0.0 06/12/2013 0524   Comprehensive Metabolic Panel:    Component Value Date/Time   NA 147* 02/28/2015 0456   NA 142 03/02/2014 1244   K 3.7 02/28/2015 0456   K 4.1 03/02/2014 1244   CL 110 02/28/2015 0456   CL 110* 03/02/2014 1244   CO2 32 02/28/2015 0456   CO2 27 03/02/2014 1244   BUN <5* 02/28/2015 0456   BUN 8 03/02/2014 1244   CREATININE 0.52 02/28/2015 0456   CREATININE 0.74 03/02/2014 1244   GLUCOSE 106* 02/28/2015 0456   GLUCOSE 94 03/02/2014 1244   CALCIUM 8.3* 02/28/2015 0456   CALCIUM 9.2 03/02/2014 1244   AST 17 02/25/2015 0523   AST 34 03/02/2014 1244   ALT 13* 02/25/2015 0523   ALT 33 03/02/2014 1244   ALKPHOS 101 02/25/2015 0523   ALKPHOS 100 03/02/2014 1244   BILITOT 0.8 02/25/2015 0523   BILITOT 0.4 03/02/2014 1244   PROT 7.0 02/25/2015 0523   PROT 7.3 03/02/2014 1244   ALBUMIN 3.1* 02/25/2015 0523   ALBUMIN 3.6 03/02/2014 1244    More than 50% of the visit was spent in counseling/coordination of care: Yes  Time Spent: 110 minutes This encounter took extra time due to all the many conversations with [facility]ACE, Care Mgmt, Nursing, Pt, etc.

## 2015-03-04 NOTE — Progress Notes (Signed)
CSW was informed by Neoma Laming- admissions coordinator at [facility] that patient had a chest tube and they could not accept patient with a chest tube. CSW informed Neoma Laming that patient does not have a chest tube. Neoma Laming reports that this has to be documented by the MD before she will reevaluate patient's referral. CSW paged MD [place] tor request an updated note stating that patient's chest tube has been removed. Per MD Genevive Bi he will document that patient's chest tube was removed tomorrow evening. CSW informed RN Case Manager of above. CSW will forward documentation of chest tube removal to Neoma Laming at [facility] tomorrow.  CSW contacted [place] Education officer, museum with [facility] and informed her of above. She reports that patient "should" have a bed at [facility] tomorrow and requested CSW forward updated progress notes stating that chest tube has been removed to [facility] [address]. Ivin Booty reported that she will not be in the office tomorrow and Friday. Stated she'll provide CSW's information to the Social Worker that will be covering for her these two days.   CSW will continue to follow assist.  Ernest Pine, MSW, [place] Work Department [phone]

## 2015-03-04 NOTE — Care Management Important Message (Signed)
Important Message  Patient Details  Name: Emily Kidd MRN: [id number] Date of Birth: Feb 09, 1950   Medicare Important Message Given:  Yes    [name] 03/04/2015, 11:17 AM

## 2015-03-04 NOTE — Progress Notes (Signed)
Nutrition Follow-up     INTERVENTION:  Meals and snacks: Cater to pt preferences, lunch order called to kitchen Medical Nutrition Supplement Therapy: continue ensure for added nutrition Nutrition related medication management: May need to increase bowel regimen to promote bowel movement   NUTRITION DIAGNOSIS:   Inadequate oral intake related to acute illness as evidenced by meal completion < 50%.    GOAL:   Patient will meet greater than or equal to 90% of their needs    MONITOR:    (Energy intake, Electrolyte and renal profile)  REASON FOR ASSESSMENT:   Malnutrition Screening Tool    ASSESSMENT:      Pt continues on HFNC, possible discharge to LTAC   Current Nutrition: reports vomited this am after breakfast and appetite has not been good during admission   Gastrointestinal Profile: Last BM: 1/24   Scheduled Medications:  . antiseptic oral rinse  7 mL Mouth Rinse Q2H while awake  . atorvastatin  40 mg Oral QHS  . bisacodyl  10 mg Oral Daily  . budesonide (PULMICORT) nebulizer solution  0.5 mg Nebulization BID  . carvedilol  3.125 mg Oral BID  . chlorhexidine gluconate  15 mL Mouth Rinse BID  . docusate sodium  100 mg Oral BID  . feeding supplement (ENSURE ENLIVE)  237 mL Oral TID  . insulin aspart  0-9 Units Subcutaneous TID WC  . ipratropium-albuterol  3 mL Nebulization Q6H WA  . lidocaine  1 patch Transdermal Q24H  . midodrine  5 mg Oral TID WC  . oxybutynin  5 mg Oral QHS  . pantoprazole  40 mg Oral BH-q7a  . pregabalin  200 mg Oral BID  . traZODone  100 mg Oral QHS  . venlafaxine XR  75 mg Oral Daily      Electrolyte/Renal Profile and Glucose Profile:   Recent Labs Lab 02/26/15 1019 02/26/15 2039 02/28/15 0456  NA 143 141 147*  K 3.7 4.2 3.7  CL 111 110 110  CO2 26 22 32  BUN 6 6 <5*  CREATININE 0.58 0.62 0.52  CALCIUM 8.2* 8.2* 8.3*  GLUCOSE 166* 152* 106*        Weight Trend since Admission: Filed Weights   02/24/15 0909   Weight: 162 lb (73.483 kg)      Diet Order:  DIET DYS 3 Room service appropriate?: Yes with Assist; Fluid consistency:: Thin  Skin:   reviewed   Height:   Ht Readings from Last 1 Encounters:  02/24/15 '5\' 4"'$  (1.626 m)    Weight:   Wt Readings from Last 1 Encounters:  02/24/15 162 lb (73.483 kg)    Ideal Body Weight:     BMI:  Body mass index is 27.79 kg/(m^2).  Estimated Nutritional Needs:   Kcal:  BEE 1025 kcals (IF 1.1-1.3, AF 1.2)1353-1599 kcals/d. Usign IBW of 50kg  Protein:  (1.2-1.5 g/kg) 60-75 g/d  Fluid:  (25-64m/kg) 1250-15063md  EDUCATION NEEDS:   No education needs identified at this time  MOPleasure PointAlZenia ResidesRDIoscoLDTwin Lakespager) Weekend/On-Call pager ([phone]

## 2015-03-04 NOTE — Care Management Important Message (Signed)
Important Message  Patient Details  Name: Emily Kidd MRN: [id number] Date of Birth: 1950/10/28   Medicare Important Message Given:  Yes    Juliann Pulse A Thresia Ramanathan 03/04/2015, 11:16 AM

## 2015-03-05 DIAGNOSIS — A419 Sepsis, unspecified organism: Secondary | ICD-10-CM

## 2015-03-05 DIAGNOSIS — C3412 Malignant neoplasm of upper lobe, left bronchus or lung: Secondary | ICD-10-CM

## 2015-03-05 DIAGNOSIS — Z515 Encounter for palliative care: Secondary | ICD-10-CM

## 2015-03-05 DIAGNOSIS — C3432 Malignant neoplasm of lower lobe, left bronchus or lung: Principal | ICD-10-CM

## 2015-03-05 DIAGNOSIS — I69359 Hemiplegia and hemiparesis following cerebral infarction affecting unspecified side: Secondary | ICD-10-CM

## 2015-03-05 DIAGNOSIS — D649 Anemia, unspecified: Secondary | ICD-10-CM

## 2015-03-05 DIAGNOSIS — J189 Pneumonia, unspecified organism: Secondary | ICD-10-CM

## 2015-03-05 DIAGNOSIS — J9621 Acute and chronic respiratory failure with hypoxia: Secondary | ICD-10-CM

## 2015-03-05 DIAGNOSIS — R000 Tachycardia, unspecified: Secondary | ICD-10-CM

## 2015-03-05 DIAGNOSIS — Z87891 Personal history of nicotine dependence: Secondary | ICD-10-CM

## 2015-03-05 LAB — GLUCOSE, CAPILLARY
GLUCOSE-CAPILLARY: 97 mg/dL (ref 65–99)
Glucose-Capillary: 177 mg/dL — ABNORMAL HIGH (ref 65–99)
Glucose-Capillary: 96 mg/dL (ref 65–99)

## 2015-03-05 NOTE — Care Management (Signed)
Patient is medically stable for discharge today and [facility] has not found skilled nursing bed to accept patient.  Patient had met criteria for ltac but not approved by [facility].

## 2015-03-05 NOTE — Progress Notes (Addendum)
CSW received a phone call from [facility] stating that [facility] is not able to take patient stating they do not have any available beds. She stated she's working with [facility] to determine if they have a bed available for patient.   4:36PM: Received call form [facility] stating that [facility] cannot offer patient a bed at this time. She reports that she has sent a referral to [facility] in [place] and has not heard anything back. CSW requested [facility] to get their MD involved with advocating for patient. Per Kristeen Miss she will resend referrals to [facility] and to a facility in [place], [place] and request [facility]'s MD get involved with SNF placement. She reports that she will be available today until 5PM.   CSW will continue to follow and assist.  Ernest Pine, MSW, [place] Work Department [phone]

## 2015-03-05 NOTE — Discharge Summary (Signed)
Physician Discharge Summary  Patient ID: KATLYN MULDREW MRN: [id number] DOB/AGE: 05-Oct-1950 65 y.o.  Admit date: 02/24/2015 Discharge date: 03/05/2015   Discharge Diagnoses:  Active Problems:   Interstitial lung disease ([place])   Procedures:  Left thoracoscopy with lung biopsy  Hospital Course: Ms. Boxx is a 65 year old woman admitted for lung biopsy after bronchoscopy failed to provide definitive diagnosis.  She was taken to the Operating Room where she underwent a thoracoscopic lung biopsy for presumed interstitial lung disease.  The final pathology returned metastatic adenocarcinoma.  She was nursed in the ICU for several days on high flow oxygen and ultimately was transitioned to floor care as her oxygen requirements diminished.  All chest tubes were removed.  She was seen by Oncology, Pulmonary, Palliative Care.  A final decision about oral chemotherapy has not yet been made.  At the time of her discharge, she was tolerating nasal canula oxygen, eating a regular diet without any wound complications.  She should followup with Oncology.  Disposition: 01-Home or Self Care  Discharge Instructions    No antithrombotics    Complete by:  As directed   Due to hemoptysis            Medication List    STOP taking these medications        acetaminophen 650 MG CR tablet  Commonly known as:  TYLENOL  Replaced by:  acetaminophen 325 MG tablet     albuterol (2.5 MG/3ML) 0.083% nebulizer solution  Commonly known as:  PROVENTIL     alum & mag hydroxide-simeth 462-703-50 MG/5ML suspension  Commonly known as:  MAALOX/MYLANTA     atorvastatin 40 MG tablet  Commonly known as:  LIPITOR     benzocaine 20 % Gel  Commonly known as:  HURRICAINE     clopidogrel 75 MG tablet  Commonly known as:  PLAVIX     docusate sodium 100 MG capsule  Commonly known as:  COLACE     IMODIUM A-D 2 MG capsule  Generic drug:  loperamide     liver oil-zinc oxide 40 % ointment  Commonly known as:  DESITIN     meclizine 12.5 MG tablet  Commonly known as:  ANTIVERT     Melatonin 3 MG Tabs     metFORMIN 500 MG tablet  Commonly known as:  GLUCOPHAGE     OPTIVE 0.5-0.9 % Soln  Generic drug:  Carboxymethylcellul-Glycerin     oxybutynin 10 MG 24 hr tablet  Commonly known as:  DITROPAN-XL     polyethylene glycol packet  Commonly known as:  MIRALAX / GLYCOLAX     sodium chloride 0.65 % Soln nasal spray  Commonly known as:  OCEAN     SPIRIVA HANDIHALER 18 MCG inhalation capsule  Generic drug:  tiotropium     venlafaxine XR 75 MG 24 hr capsule  Commonly known as:  EFFEXOR-XR     Vitamin D (Ergocalciferol) 50000 units Caps capsule  Commonly known as:  DRISDOL     VOLTAREN 1 % Gel  Generic drug:  diclofenac sodium     ZEASORB-AF 2 % powder  Generic drug:  miconazole      TAKE these medications        acetaminophen 325 MG tablet  Commonly known as:  TYLENOL  Take 2 tablets (650 mg total) by mouth every 6 (six) hours as needed for mild pain or fever.     benzonatate 100 MG capsule  Commonly known as:  TESSALON  Take  100 mg by mouth 3 (three) times daily as needed for cough.     bisacodyl 10 MG suppository  Commonly known as:  DULCOLAX  Place 1 suppository (10 mg total) rectally as needed for moderate constipation.     calcium carbonate 500 MG chewable tablet  Commonly known as:  TUMS - dosed in mg elemental calcium  Chew 1 tablet by mouth every 4 (four) hours as needed for indigestion or heartburn.     camphor-menthol lotion  Commonly known as:  SARNA  Apply 1 application topically 4 (four) times daily as needed for itching.     carboxymethylcellulose 0.5 % Soln  Commonly known as:  REFRESH PLUS  Place 1 drop into both eyes 3 (three) times daily as needed (dry eyes).     carvedilol 3.125 MG tablet  Commonly known as:  COREG  Take 3.125 mg by mouth 2 (two) times daily.     DESITIN Oint  Apply 1 application topically every morning. To buttocks     feeding supplement  (ENSURE ENLIVE) Liqd  Take 237 mLs by mouth 3 (three) times daily.     fluticasone 50 MCG/ACT nasal spray  Commonly known as:  FLONASE  Place 2 sprays into both nostrils daily.     insulin aspart 100 UNIT/ML injection  Commonly known as:  novoLOG  Inject 0-9 Units into the skin 3 (three) times daily with meals.     ipratropium-albuterol 0.5-2.5 (3) MG/3ML Soln  Commonly known as:  DUONEB  Take 3 mLs by nebulization every 6 (six) hours.     ipratropium-albuterol 0.5-2.5 (3) MG/3ML Soln  Commonly known as:  DUONEB  Take 3 mLs by nebulization every 2 (two) hours as needed.     lidocaine 5 %  Commonly known as:  LIDODERM  Place 1 patch onto the skin at bedtime. Remove & Discard patch within 12 hours or as directed by MD     LORazepam 0.5 MG tablet  Commonly known as:  ATIVAN  Take 1 tablet (0.5 mg total) by mouth every 4 (four) hours as needed for anxiety.     midodrine 5 MG tablet  Commonly known as:  PROAMATINE  Take 1 tablet (5 mg total) by mouth 3 (three) times daily with meals.     morphine CONCENTRATE 10 mg / 0.5 ml concentrated solution  Take 0.25 mLs (5 mg total) by mouth every 2 (two) hours as needed for moderate pain, severe pain or shortness of breath.     nitroGLYCERIN 0.4 MG SL tablet  Commonly known as:  NITROSTAT  Place 1 tablet (0.4 mg total) under the tongue every 5 (five) minutes as needed for chest pain.     pantoprazole 40 MG tablet  Commonly known as:  PROTONIX  Take 40 mg by mouth every morning.     pregabalin 200 MG capsule  Commonly known as:  LYRICA  Take 1 capsule (200 mg total) by mouth 2 (two) times daily.     prochlorperazine 25 MG suppository  Commonly known as:  COMPAZINE  Place 1 suppository (25 mg total) rectally every 12 (twelve) hours as needed for nausea or vomiting.     senna 8.6 MG Tabs tablet  Commonly known as:  SENOKOT  Take 1 tablet by mouth every evening.     traZODone 100 MG tablet  Commonly known as:  DESYREL  Take 100  mg by mouth at bedtime.         Nestor Lewandowsky, MD

## 2015-03-05 NOTE — Progress Notes (Signed)
She looks better today.  In good spirits about leaving the hospital.  She ate well yesterday and has no pain or shortness of breath.  Her wounds are clean and dry. Lungs with markedly diminished breath sounds on the left Heart regular  Will discharge to facility today.  Appreciate Palliative Care, Care Management and Social Services input into the care of Emily Kidd.  She will need to followup with Dr. Grayland Ormond for definitive cancer care therapy if any.

## 2015-03-05 NOTE — Progress Notes (Addendum)
Palliative Medicine Inpatient Consult Follow Up Note   Name: Emily Kidd Date: 03/05/2015 MRN: [id number]  DOB: 1950-12-08  Referring Physician: Nestor Lewandowsky, MD  Palliative Care consult requested for this 65 y.o. female for goals of medical therapy in patient with lung cancer in Left upper and lower lung zones.  Pt has refused IV chemo but 'would take a pill'. Genetic studies are pending with oncology to see if she is a candidate for a specific oral chemo pill.  She was on Hi Flow oxygen but was able to be changed to 5 LPM nasal cannula oxygen.  An attempt was made to see if she could handle 4 LPM oxygen, and though she did OK for a while, eventually, her sats dropped and she was put back on 5 LPM oxygen.   PLAN: No bed available today at [facility] ---search is on for another bed with [facility] staff working on that. She is DNR and DNI AND NO HI FLOW and BIPAP only for 3 days (no more than that). She will deteriorate and likely has a very limited lifespan so she should be on end-of-life care as provided by the [facility] program. She does not ever want to go to an LTAC or live in a hospital so she has agreed to avoid those measures that would mean she would have to go there (Hi Flow oxygen etc).  She seemed to understand that she has a very serious cancer that will limit her lifespan --but she does not appreciate that it is probably going to cause her respiratory distress and continued hemoptysis.  Staff at the facility will have to be educated that this is not fixable by transferring her to a hospital.    I note that telemetry is still in place, but this has been DCd.  Asked nursing to remove it.  Pt to get out of bed today.  She has good mobility using her right arm  in bed --but still does need assistance when her bedding is changed, etc (due to left AKA and left sided weakness) Pt is informed she may not go anywhere today and she is a bit disappointed but I reassured her it is being worked on.     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  Hypertension Dyslipidemia    COPD (chronic obstructive pulmonary disease) ([place]) Former Smoker     Pneumonia    Sepsis ([place])    Lung mass    Diabetes mellitus without complication (HCC)     Dysrhythmia     TACHYCARDIA   GERD (gastroesophageal reflux disease)    Mitral regurgitation    Carotid artery stenosis    Muscle spasms of neck    TIA (transient ischemic attack)    Anxiety    Hyperlipidemia    Major depressive disorder (HCC)    Chronic pain syndrome    Drug abuse in remission    Vertigo    Stable angina (HCC)    Neuropathy (HCC)    Left-sided weakness     FROM TIA   Coronary artery disease    Shortness of breath dyspnea    Constipation  Hypernatremia (Na 147)  Leukocytosis (WBC now down to 12)  Anemia  of unclear etiology with Hgb down from 11 to 9.8)  Edentulous status: Diet is currently mech soft with thin liquids --tolerating per SLP  Poor IV access (ABX were given IM for a period of time)           CODE STATUS: DNR --DNI   ALSO NO HI FLOW OXYGEN AND NO BIPAP FOR LONGER THAN 3 DAYS PER PTS WISHES.     Vital Signs: BP 95/47 mmHg  Pulse 91  Temp(Src) 98.1 F (36.7 C) (Oral)  Resp 18  Ht '5\' 4"'$  (1.626 m)  Wt 73.483 kg (162 lb)  BMI 27.79 kg/m2  SpO2 91% Filed Weights   02/24/15 0909  Weight: 73.483 kg (162 lb)    Estimated body mass index is 27.79 kg/(m^2) as calculated from the following:   Height as of this encounter:  '5\' 4"'$  (1.626 m).   Weight as of this encounter: 73.483 kg (162 lb).  PHYSICAL EXAM: NAD On 5 LP O2 with sats 91-92 % (OK for her) EOMI OP clear --no blood on lips Neck w/o JVD or TM Hrt rrr no mgr BP = 95/47 Pulse 91-104 Lungs with ronchi and decreased BS left lung base and apex Abd soft and NT Ext left AKA       Left sided weakness from prior CVA Skin warm and dry  LABS: CBC:    Component Value Date/Time   WBC 12.0* 02/28/2015 0456   WBC 6.7 03/02/2014 1244   HGB 9.8* 02/28/2015 0456   HGB 12.8 03/02/2014 1244   HCT 31.5* 02/28/2015 0456   HCT 40.1 03/02/2014 1244   PLT 300 02/28/2015 0456   PLT 307 03/02/2014 1244   MCV 81.4 02/28/2015 0456   MCV 87 03/02/2014 1244   NEUTROABS 5.0 01/15/2015 1000   NEUTROABS 4.4 06/12/2013 0524   LYMPHSABS 0.9* 01/15/2015 1000   LYMPHSABS 1.4 06/12/2013 0524   MONOABS 0.8 01/15/2015 1000   MONOABS 0.7 06/12/2013 0524   EOSABS 0.2 01/15/2015 1000   EOSABS 0.2 06/12/2013 0524   BASOSABS 0.0 01/15/2015 1000   BASOSABS 0.0 06/12/2013 0524   Comprehensive Metabolic Panel:    Component Value Date/Time   NA 147* 02/28/2015 0456   NA 142 03/02/2014 1244   K 3.7 02/28/2015 0456   K 4.1 03/02/2014 1244   CL 110 02/28/2015 0456   CL 110* 03/02/2014 1244   CO2 32 02/28/2015 0456   CO2 27 03/02/2014 1244   BUN <5* 02/28/2015 0456   BUN 8 03/02/2014 1244   CREATININE 0.52 02/28/2015 0456   CREATININE 0.74 03/02/2014 1244   GLUCOSE 106* 02/28/2015 0456   GLUCOSE 94 03/02/2014 1244   CALCIUM 8.3* 02/28/2015 0456   CALCIUM 9.2 03/02/2014 1244   AST 17 02/25/2015 0523   AST 34 03/02/2014 1244   ALT 13* 02/25/2015 0523   ALT 33 03/02/2014 1244   ALKPHOS 101 02/25/2015 0523   ALKPHOS 100 03/02/2014 1244   BILITOT 0.8 02/25/2015 0523   BILITOT 0.4 03/02/2014 1244   PROT 7.0 02/25/2015 0523   PROT 7.3 03/02/2014 1244   ALBUMIN 3.1* 02/25/2015 0523   ALBUMIN 3.6 03/02/2014 1244     More than 50% of the visit was spent in  counseling/coordination of care: YES   Time Spent:  25 min

## 2015-03-05 NOTE — Progress Notes (Signed)
Patient ID: REECE MCBROOM, female   DOB: [birth year], 65 y.o.   MRN: [id number] [facility] PROGRESS NOTE  JANCY SPRANKLE YQI:[id number] DOB: May 05, 1950 DOA: 02/24/2015 PCP: [place]  HPI/Subjective:  Currently on 5 L of oxygen, minimal hemoptysis   Objective: Filed Vitals:   03/05/15 0549 03/05/15 0553  BP: 95/47   Pulse: 104 91  Temp: 98.1 F (36.7 C)   Resp: 18     Filed Weights   02/24/15 0909  Weight: 73.483 kg (162 lb)    ROS: Review of Systems  Constitutional: Negative for fever and chills.  Eyes: Negative for blurred vision.  Respiratory: Positive for cough and resolved shortness of breath.   Cardiovascular: Denies chest pain  Gastrointestinal: Negative for nausea, vomiting, abdominal pain, diarrhea and constipation.  Genitourinary: Negative for dysuria.  Musculoskeletal: Negative for joint pain.  Neurological: Negative for dizziness and headaches.   Exam: Physical Exam  Constitutional: She is oriented to person, place, and time.  HENT:  Nose: No mucosal edema.  Mouth/Throat: No oropharyngeal exudate or posterior oropharyngeal edema.  Eyes: Conjunctivae, EOM and lids are normal. Pupils are equal, round, and reactive to light.  Neck: No JVD present. Carotid bruit is not present. No edema present. No thyroid mass and no thyromegaly present.  Cardiovascular: S1 normal and S2 normal.  Exam reveals no gallop.   Murmur heard.  Systolic murmur is present with a grade of 3/6  Pulses:      Dorsalis pedis pulses are 2+ on the right side, and 2+ on the left side.  Respiratory: No respiratory distress. She has decreased breath sounds in the left lung occasional rhonchi GI: Soft. Bowel sounds are normal. There is no tenderness.  Musculoskeletal:       Right ankle: She exhibits swelling.  Lymphadenopathy:    She has no cervical adenopathy.  Neurological: She is alert and oriented to person, place, and time.  Left-sided paralysis  Skin: Skin  is warm. No rash noted. Nails show no clubbing.  Psychiatric: She has a normal mood and affect.      Data Reviewed: Basic Metabolic Panel:  Recent Labs Lab 02/26/15 1019 02/26/15 2039 02/28/15 0456  NA 143 141 147*  K 3.7 4.2 3.7  CL 111 110 110  CO2 26 22 32  GLUCOSE 166* 152* 106*  BUN 6 6 <5*  CREATININE 0.58 0.62 0.52  CALCIUM 8.2* 8.2* 8.3*   Liver Function Tests: No results for input(s): AST, ALT, ALKPHOS, BILITOT, PROT, ALBUMIN in the last 168 hours. CBC:  Recent Labs Lab 02/26/15 2039 02/28/15 0456  WBC 17.4* 12.0*  HGB 11.7* 9.8*  HCT 38.4 31.5*  MCV 82.5 81.4  PLT 319 300   CBG:  Recent Labs Lab 03/03/15 1539 03/04/15 0823 03/04/15 1147 03/04/15 1717 03/04/15 2138  GLUCAP 144* 199* 129* 118* 101*    Recent Results (from the past 240 hour(s))  Anaerobic culture     Status: None   Collection Time: 02/24/15 12:04 PM  Result Value Ref Range Status   Specimen Description LUNG  Final   Special Requests NONE  Final   Culture NO ANAEROBES ISOLATED  Final   Report Status 02/28/2015 FINAL  Final  Fungus Culture with Smear     Status: None (Preliminary result)   Collection Time: 02/24/15 12:04 PM  Result Value Ref Range Status   Specimen Description LUNG  Final   Special Requests NONE  Final   Culture NO FUNGUS ISOLATED AFTER 8  DAYS  Final   Report Status PENDING  Incomplete  Tissue culture     Status: None   Collection Time: 02/24/15 12:04 PM  Result Value Ref Range Status   Specimen Description LUNG  Final   Special Requests NONE  Final   Gram Stain MODERATE WBC SEEN NO ORGANISMS SEEN   Final   Culture NO GROWTH 3 DAYS  Final   Report Status 02/28/2015 FINAL  Final  Virus culture     Status: None   Collection Time: 02/24/15 12:04 PM  Result Value Ref Range Status   Viral Culture No virus isolated.  Final    Comment: (NOTE) Please indicate source of specimen on all future request forms. Performed At: [facility] [place], [place] [id number] Lindon Romp MD CH:[id number]    Source of Sample LUNG  Final     Studies: No results found.  Scheduled Meds: . bisacodyl  10 mg Oral Daily  . carvedilol  3.125 mg Oral BID  . feeding supplement (ENSURE ENLIVE)  237 mL Oral TID  . insulin aspart  0-9 Units Subcutaneous TID WC  . ipratropium-albuterol  3 mL Nebulization Q6H WA  . lidocaine  1 patch Transdermal Q24H  . midodrine  5 mg Oral TID WC  . pantoprazole  40 mg Oral BH-q7a  . pregabalin  200 mg Oral BID  . traZODone  100 mg Oral QHS   Continuous Infusions:    Assessment/Plan:  1. Acute respiratory failure with hypoxia. Continue supportive care with oxygen therapy . Appreciate palliative care input, patient's could be discharge to skilled nursing facility with goal of comfort 2. Chest pain status post thoracoscopy and lung biopsy for pulmonary hemorrhage. When necessary IV morphine and oral oxycodone. Currently pain under control 3. Suspected pneumonia  Off antibiotics 4. Hypotension  now resolved on midrodrine 5. Type 2 diabetes mellitus on sliding scale blood glucose stable 6. History of CVA with left-sided paralysis- off anticoagulation at this point. Due to patient having hemoptysis would likely not resume Plavix 7. Hyperlipidemia unspecified discontinue in light of lung cancer 8. Lung cancer:  Appear the patient will not be getting any treatment for this CODE STATUS DO NOT RESUSCITATE  From a medical standpoint discharged to skilled nursing facility Code Status:     Code Status Orders        Start     Ordered   02/24/15 1530  Full code   Continuous     02/24/15 1529    Code Status History    Date Active Date Inactive Code Status Order ID Comments User Context   01/04/2015  8:57 AM 01/06/2015  8:46 PM Full Code [id number]  Harrie Foreman, MD Inpatient   12/15/2014  7:01 PM 12/16/2014  4:16 PM Full Code [id number]  Demetrios Loll, MD Inpatient   11/16/2014  5:48 AM 11/21/2014   4:42 PM Full Code [id number]  Verlin Dike, RN Inpatient   11/16/2014  4:31 AM 11/16/2014  5:47 AM DNR [id number]  Quintella Baton, MD Inpatient    Advance Directive Documentation        Most Recent Value   Type of Advance Directive  Healthcare Power of Attorney, Living will   Pre-existing out of facility DNR order (yellow form or pink MOST form)     "MOST" Form in Place?       Disposition Plan: to be determined  Consultants:  Pulmonary  Cardiothoracic surgery  Procedures:  Thoracoscopy lung biopsy and chest  tube  Antibiotics:  Vancomycin  Meropenem  Time spent: 25 minutes,  Antania Hoefling, [place] [facility]

## 2015-03-05 NOTE — Discharge Instructions (Signed)
Please make appointment with Dr. Delight Hoh in [place] within one week for definitive management of lung cancer.

## 2015-03-06 LAB — GLUCOSE, CAPILLARY
GLUCOSE-CAPILLARY: 130 mg/dL — AB (ref 65–99)
Glucose-Capillary: 88 mg/dL (ref 65–99)

## 2015-03-06 NOTE — Progress Notes (Signed)
Palliative Medicine Inpatient Consult Follow Up Note   Name: Emily Kidd Date: 03/06/2015 MRN: [id number]  DOB: 03/25/1950  Referring Physician: Nestor Lewandowsky, MD  Palliative Care consult requested for this 65 y.o. 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  She has had intermittent hemoptysis and this will likely be ongoing.   PLAN: Pt is not going to get any chemo. 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 But she is pretty with it cognitively and can make her own health care decisions at this point in time.  She was taken off of Hi Flow oxygen while here but remains on 5 LPM oxygen via nasal cannula --DO NOT TAPER DOWN FROM 5 LITERS.  She will be getting end-of-life care as provided by [facility] as she is a [facility] client/pt.  They make the arrangements for her end of life care within the facility. Pt still should be getting some exercise and will need to be gotten out of bed and encouraged to participate in as many ADLs as she can. She is usually able to assist or do a lot of her own ADLs.    She is DNR.  [facility] to follow. DC meds were done by me and I cut back on a lot of her med orders as she had too many and they were not essential.    IMPORTANT:  She does not ever want to be on HI FLOW OXYGEN again. And, if she gets into distress, she would only want BIPAP for a max of 3 days, not longer.  She is DO NOT INTUBATE status also.   She is expected to decline and pass away within 3 - 6 months, but of course we do not know with certainty how she will fare.    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  Hypertension Dyslipidemia    COPD (chronic obstructive pulmonary disease) ([place]) Former Smoker     Pneumonia    Sepsis ([place])    Lung mass    Diabetes mellitus without complication (HCC)     Dysrhythmia     TACHYCARDIA   GERD (gastroesophageal reflux disease)    Mitral regurgitation    Carotid artery stenosis    Muscle spasms of neck    TIA (transient ischemic attack)    Anxiety    Hyperlipidemia    Major depressive disorder (HCC)    Chronic pain syndrome    Drug abuse in remission    Vertigo    Stable angina (HCC)    Neuropathy (HCC)    Left-sided weakness     FROM TIA   Coronary artery disease    Shortness of breath dyspnea    Constipation  Hypernatremia (Na 147)  Leukocytosis (WBC now down to 12)  Anemia of unclear etiology with Hgb down from 11 to 9.8)  Edentulous status: Diet is currently mech soft with thin liquids --tolerating per SLP  Poor IV access (ABX were given IM for a period of time)  CODE STATUS: DNR     Vital Signs: BP 131/55 mmHg  Pulse 85  Temp(Src) 99 F (37.2 C) (Oral)  Resp 18  Ht '5\' 4"'$  (1.626 m)  Wt 73.483 kg (162 lb)  BMI 27.79 kg/m2  SpO2 92% Filed Weights   02/24/15 0909  Weight: 73.483 kg (162 lb)    Estimated body mass index is 27.79 kg/(m^2) as calculated from the following:   Height as of this encounter: '5\' 4"'$  (1.626 m).   Weight as of this encounter: 73.483 kg (162 lb).  PHYSICAL EXAM: NAD Getting dressed to be transported to [facility] w/o blood Skin warm and dry Alert and oriented    More than 50% of the visit was spent in counseling/coordination of care: YES  Time Spent:  15 min

## 2015-03-06 NOTE — Clinical Social Work Placement (Signed)
   CLINICAL SOCIAL WORK PLACEMENT  NOTE  Date:  03/06/2015  Patient Details  Name: Emily Kidd MRN: [id number] Date of Birth: 27-Dec-1950  Clinical Social Work is seeking post-discharge placement for this patient at the [place] level of care (*CSW will initial, date and re-position this form in  chart as items are completed):  Yes   Patient/family provided with [place] Work Department's list of facilities offering this level of care within the geographic area requested by the patient (or if unable, by the patient's family).  Yes   Patient/family informed of their freedom to choose among providers that offer the needed level of care, that participate in Medicare, Medicaid or managed care program needed by the patient, have an available bed and are willing to accept the patient.  Yes   Patient/family informed of [place]'s ownership interest in [facility] and [facility], as well as of the fact that they are under no obligation to receive care at these facilities.  PASRR submitted to EDS on       PASRR number received on       Existing PASRR number confirmed on 03/02/15     FL2 transmitted to all facilities in geographic area requested by pt/family on       FL2 transmitted to all facilities within larger geographic area on       Patient informed that his/her managed care company has contracts with or will negotiate with certain facilities, including the following:        Yes ([facility] contract with [facility] )   Patient/family informed of bed offers received.  Patient chooses bed at  ([facility])     Physician recommends and patient chooses bed at      Patient to be transferred to  [facility] ) on 03/06/15.  Patient to be transferred to facility by  [facility] )     Patient family notified on 03/06/15 of transfer.  Name of family member notified:   ([facility]) )     PHYSICIAN       Additional Comment:     _______________________________________________ Baldemar Lenis, LCSW 03/06/2015, 10:28 AM

## 2015-03-06 NOTE — Progress Notes (Signed)
Report called to [facility] RN Juliann Pulse. Patient status gone over with nurse. Patient to be discharged on 02 at 5L. [facility] will transport patient, ride will be available at 12:30 per social worker. Will give packet to [facility] staff.

## 2015-03-06 NOTE — Progress Notes (Signed)
Patient ID: Emily Kidd, female   DOB: [birth year], 65 y.o.   MRN: [id number] [facility] PROGRESS NOTE  Emily Kidd MEQ:[id number] DOB: 07/19/1950 DOA: 02/24/2015 PCP: [place]  HPI/Subjective:  Continues to have hemoptysis, patient did not have a bed yesterday   Objective: Filed Vitals:   03/05/15 2045 03/06/15 0451  BP:  115/47  Pulse:  84  Temp:  98.7 F (37.1 C)  Resp: 18 28    Filed Weights   02/24/15 0909  Weight: 73.483 kg (162 lb)    ROS: Review of Systems  Constitutional: Negative for fever and chills.  Eyes: Negative for blurred vision.  Respiratory: Positive for cough and resolved shortness of breath.   positive hemoptysis Cardiovascular: Denies chest pain  Gastrointestinal: Negative for nausea, vomiting, abdominal pain, diarrhea and constipation.  Genitourinary: Negative for dysuria.  Musculoskeletal: Negative for joint pain.  Neurological: Negative for dizziness and headaches.   Exam: Physical Exam  Constitutional: She is oriented to person, place, and time.  HENT:  Nose: No mucosal edema.  Mouth/Throat: No oropharyngeal exudate or posterior oropharyngeal edema.  Eyes: Conjunctivae, EOM and lids are normal. Pupils are equal, round, and reactive to light.  Neck: No JVD present. Carotid bruit is not present. No edema present. No thyroid mass and no thyromegaly present.  Cardiovascular: S1 normal and S2 normal.  Exam reveals no gallop.   Murmur heard.  Systolic murmur is present with a grade of 3/6  Pulses:      Dorsalis pedis pulses are 2+ on the right side, and 2+ on the left side.  Respiratory: No respiratory distress. She has decreased breath sounds in the left lung occasional rhonchi GI: Soft. Bowel sounds are normal. There is no tenderness.  Musculoskeletal:       Right ankle: She exhibits swelling.  Lymphadenopathy:    She has no cervical adenopathy.  Neurological: She is alert and oriented to person, place, and  time.  Left-sided paralysis  Skin: Skin is warm. No rash noted. Nails show no clubbing.  Psychiatric: She has a normal mood and affect.      Data Reviewed: Basic Metabolic Panel:  Recent Labs Lab 02/28/15 0456  NA 147*  K 3.7  CL 110  CO2 32  GLUCOSE 106*  BUN <5*  CREATININE 0.52  CALCIUM 8.3*   Liver Function Tests: No results for input(s): AST, ALT, ALKPHOS, BILITOT, PROT, ALBUMIN in the last 168 hours. CBC:  Recent Labs Lab 02/28/15 0456  WBC 12.0*  HGB 9.8*  HCT 31.5*  MCV 81.4  PLT 300   CBG:  Recent Labs Lab 03/04/15 2138 03/05/15 1118 03/05/15 1750 03/05/15 2124 03/06/15 0806  GLUCAP 101* 177* 97 96 88    Recent Results (from the past 240 hour(s))  Anaerobic culture     Status: None   Collection Time: 02/24/15 12:04 PM  Result Value Ref Range Status   Specimen Description LUNG  Final   Special Requests NONE  Final   Culture NO ANAEROBES ISOLATED  Final   Report Status 02/28/2015 FINAL  Final  Fungus Culture with Smear     Status: None (Preliminary result)   Collection Time: 02/24/15 12:04 PM  Result Value Ref Range Status   Specimen Description LUNG  Final   Special Requests NONE  Final   Culture NO FUNGUS ISOLATED AFTER 10 DAYS  Final   Report Status PENDING  Incomplete  Tissue culture     Status: None   Collection  Time: 02/24/15 12:04 PM  Result Value Ref Range Status   Specimen Description LUNG  Final   Special Requests NONE  Final   Gram Stain MODERATE WBC SEEN NO ORGANISMS SEEN   Final   Culture NO GROWTH 3 DAYS  Final   Report Status 02/28/2015 FINAL  Final  Virus culture     Status: None   Collection Time: 02/24/15 12:04 PM  Result Value Ref Range Status   Viral Culture No virus isolated.  Final    Comment: (NOTE) Please indicate source of specimen on all future request forms. Performed At: [facility] [place], [place] [id number] Lindon Romp MD UQ:[id number]    Source of Sample LUNG  Final      Studies: No results found.  Scheduled Meds: . bisacodyl  10 mg Oral Daily  . carvedilol  3.125 mg Oral BID  . feeding supplement (ENSURE ENLIVE)  237 mL Oral TID  . insulin aspart  0-9 Units Subcutaneous TID WC  . ipratropium-albuterol  3 mL Nebulization Q6H WA  . lidocaine  1 patch Transdermal Q24H  . midodrine  5 mg Oral TID WC  . pantoprazole  40 mg Oral BH-q7a  . pregabalin  200 mg Oral BID  . traZODone  100 mg Oral QHS   Continuous Infusions:    Assessment/Plan:  1. Acute respiratory failure with hypoxia. Continue supportive care with oxygen therapy . Appreciate palliative care input, discharge today with the goal of comfort care 2. Chest pain status post thoracoscopy and lung biopsy for pulmonary hemorrhage. When necessary IV morphine and oral oxycodone. Currently pain under control 3. Suspected pneumonia  Off antibiotics 4. Hypotension  now resolved on midrodrine 5. Type 2 diabetes mellitus on sliding scale blood glucose stable 6. History of CVA with left-sided paralysis- off anticoagulation at this point. Due to patient having hemoptysis would likely not resume Plavix 7. Hyperlipidemia unspecified discontinue in light of lung cancerLung cancer:  Appear the patient will not be getting any treatment for this CODE STATUS DO NOT RESUSCITATE  From a medical standpoint discharged to skilled nursing facility Code Status:     Code Status Orders        Start     Ordered   02/24/15 1530  Full code   Continuous     02/24/15 1529    Code Status History    Date Active Date Inactive Code Status Order ID Comments User Context   01/04/2015  8:57 AM 01/06/2015  8:46 PM Full Code [id number]  Harrie Foreman, MD Inpatient   12/15/2014  7:01 PM 12/16/2014  4:16 PM Full Code [id number]  Demetrios Loll, MD Inpatient   11/16/2014  5:48 AM 11/21/2014  4:42 PM Full Code [id number]  Verlin Dike, RN Inpatient   11/16/2014  4:31 AM 11/16/2014  5:47 AM DNR [id number]  Quintella Baton, MD  Inpatient    Advance Directive Documentation        Most Recent Value   Type of Advance Directive  Healthcare Power of Attorney, Living will   Pre-existing out of facility DNR order (yellow form or pink MOST form)     "MOST" Form in Place?       Disposition Plan: to be determined  Consultants:  Pulmonary  Cardiothoracic surgery  Procedures:  Thoracoscopy lung biopsy and chest tube  Antibiotics:  Vancomycin  Meropenem  Time spent: 25 minutes,  [place]oma Gotwalt, [place] [facility]

## 2015-03-06 NOTE — Care Management Important Message (Signed)
Important Message  Patient Details  Name: TERRAH DECOSTER MRN: [id number] Date of Birth: [birth year]   Medicare Important Message Given:  Yes    Juliann Pulse A Zahmir Lalla 03/06/2015, 10:07 AM

## 2015-03-06 NOTE — Progress Notes (Signed)
CSW contacted [facility] to confirm patient's bed offer. Per Emily Kidd patient will discharge to [facility] today. She stated that [facility] will transport her at 12:30PM.   Clinical Social Worker was informed that patient will be medically ready to discharge to [facility]. CSW met with patient and her nephew Emily Kidd at bedside. Patient and her family are in a agreement with plan. CSW called [facility] to confirm that patient's bed is ready. Provided patient's room number 263 and number to call for report Emily Kidd [phone] . All discharge information faxed to [facility] via [facility]. DNR added to discharge packet.   RN will call report and [facility] will [facility].  Emily Kidd, MSW, [place] Social Work Department [phone]

## 2015-03-17 LAB — FUNGUS CULTURE W SMEAR

## 2015-03-19 LAB — SURGICAL PATHOLOGY

## 2015-03-23 ENCOUNTER — Encounter: Payer: Self-pay | Admitting: Cardiothoracic Surgery

## 2015-04-10 LAB — ACID FAST SMEAR+CULTURE W/RFLX (ARMC ONLY): ACID FAST SMEAR - AFSCU2: NEGATIVE

## 2015-05-02 DEATH — deceased

## 2015-11-09 MED ORDER — BUPIVACAINE-EPINEPHRINE (PF) 0.25% -1:200000 IJ SOLN
INTRAMUSCULAR | Status: AC
  Filled 2015-11-09: qty 30

## 2016-11-17 IMAGING — CT CT ANGIO CHEST
1 of 2 series · 18 of 30 positions shown · IV contrast (APPLIED)
Comparison: Chest CT 11/16/2014

CLINICAL DATA: Hemoptysis this morning. Schedule for bronchoscopy
on 12/12/2014 for a persistent left-sided pneumonia.

EXAM:
CT ANGIOGRAPHY CHEST WITH CONTRAST
TECHNIQUE: Multidetector CT imaging of the chest was performed using the
standard protocol during bolus administration of intravenous
contrast. Multiplanar CT image reconstructions and MIPs were
obtained to evaluate the vascular anatomy.
CONTRAST:  75mL OMNIPAQUE IOHEXOL 350 MG/ML SOLN

[Series 5: pe 1.0 thins · axial · 0.68mm/px · z∈[-116,+122]mm · 18 of 268 slices shown]
[im 15/268  lung]
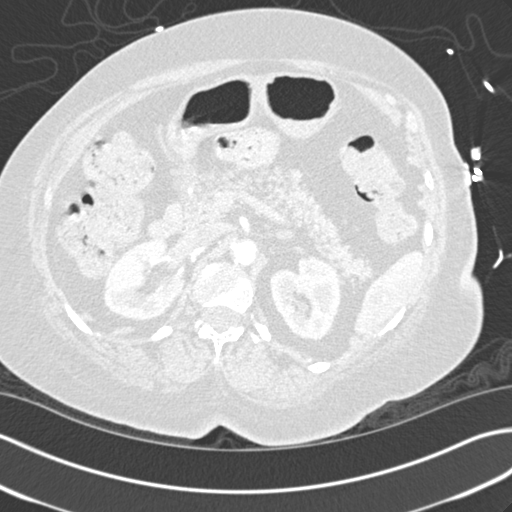
[im 30/268  mediastinal]
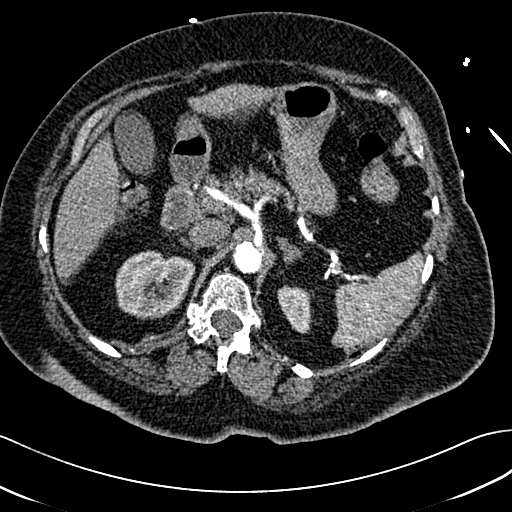
[im 45/268  lung]
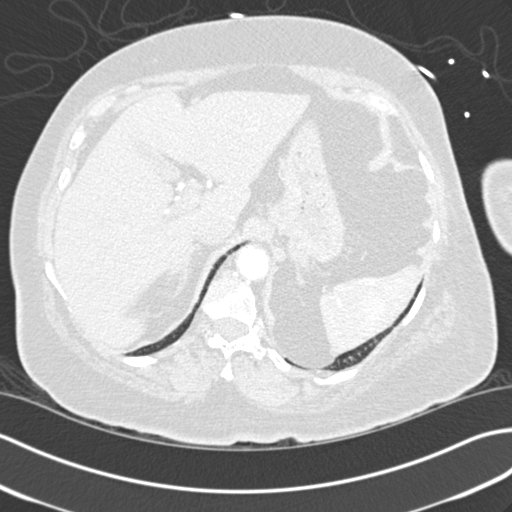
[im 60/268  mediastinal]
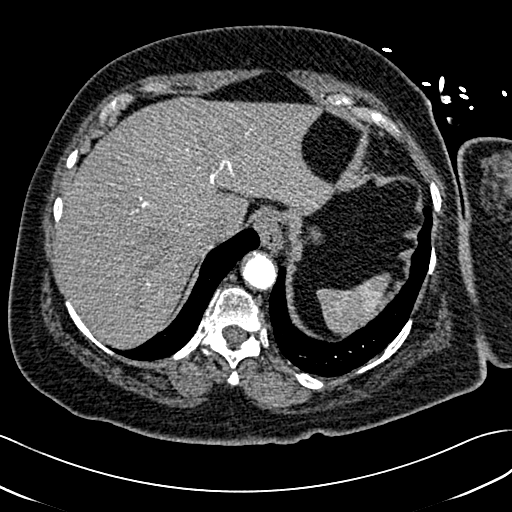
[im 75/268  lung]
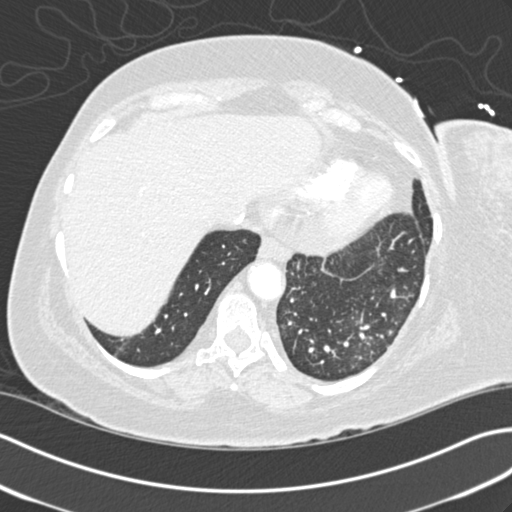
[im 90/268  mediastinal]
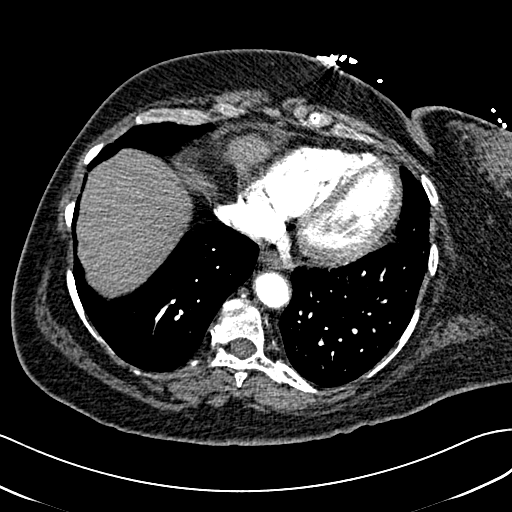
[im 104/268  lung]
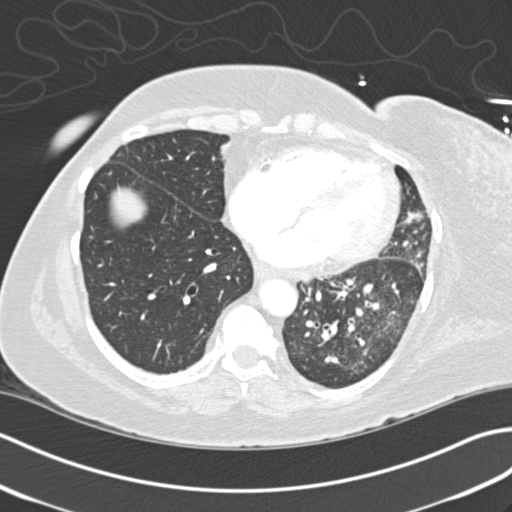
[im 119/268  mediastinal]
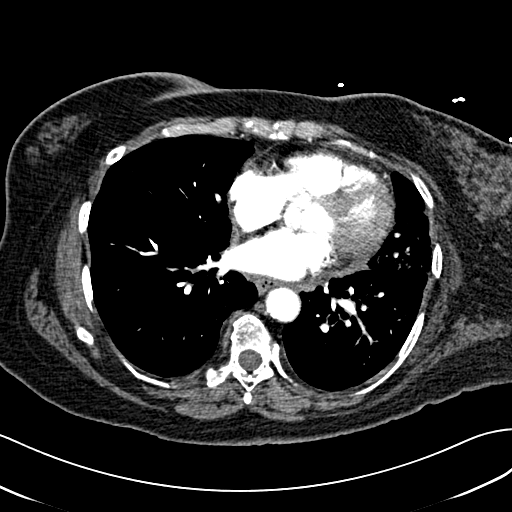
[im 125/268  lung]
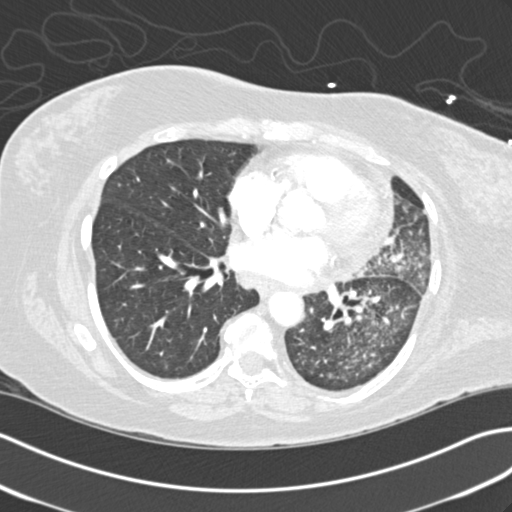
[im 134/268  mediastinal]
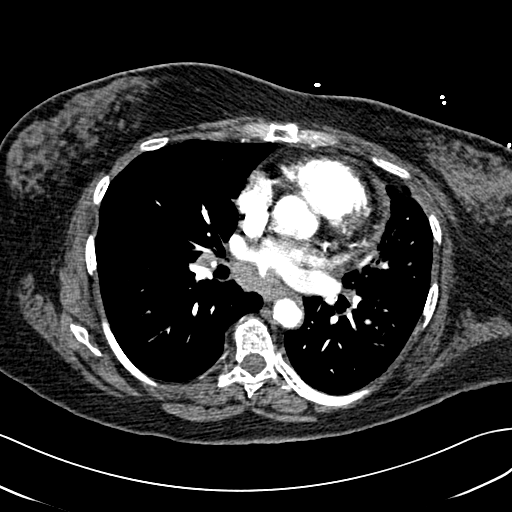
[im 149/268  lung]
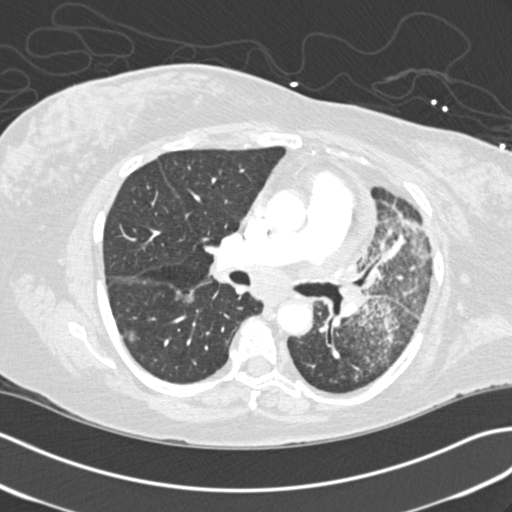
[im 164/268  mediastinal]
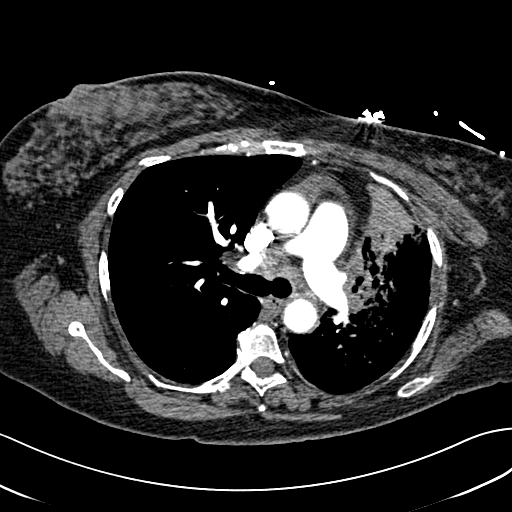
[im 179/268  lung]
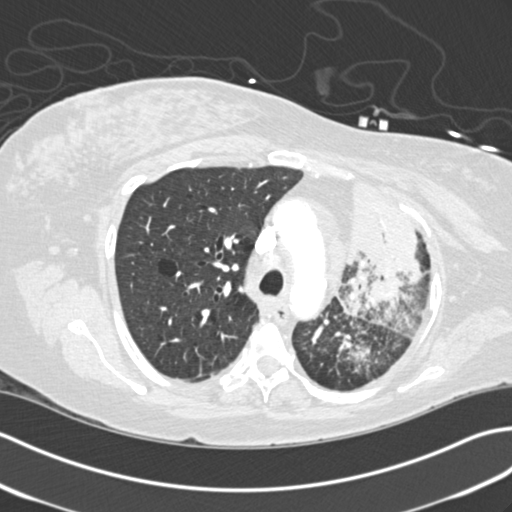
[im 193/268  mediastinal]
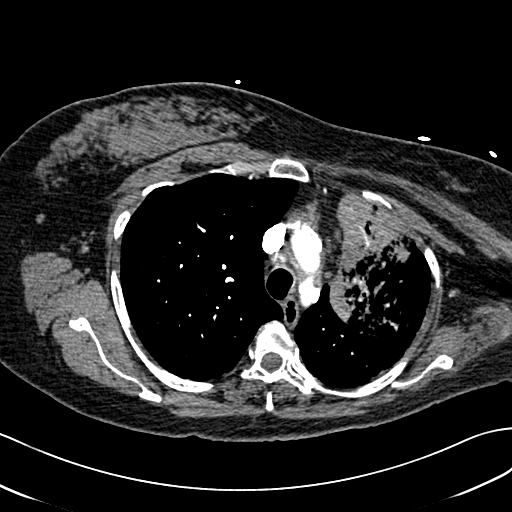
[im 208/268  lung]
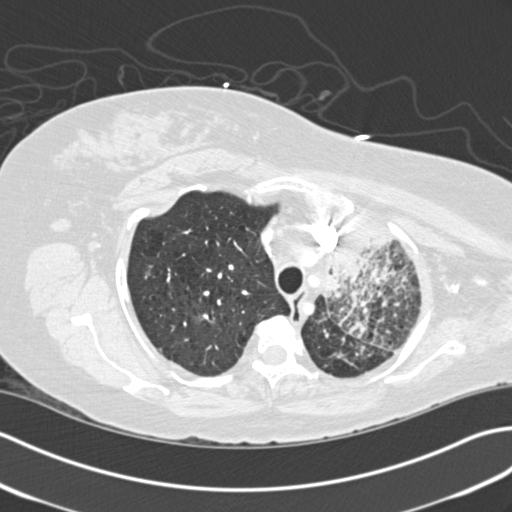
[im 223/268  mediastinal]
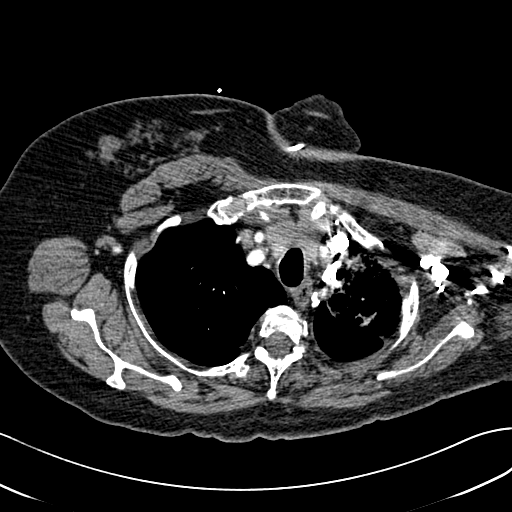
[im 238/268  lung]
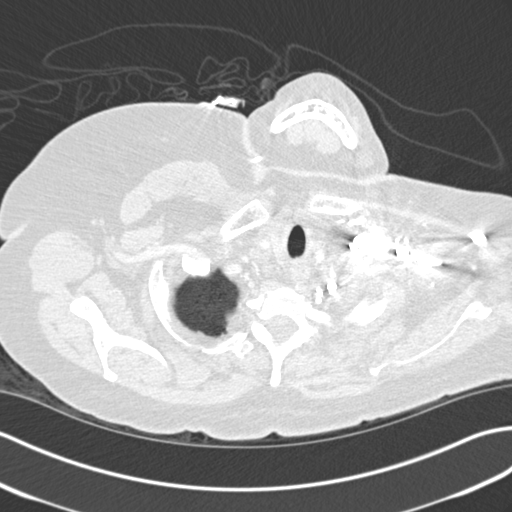
[im 253/268  mediastinal]
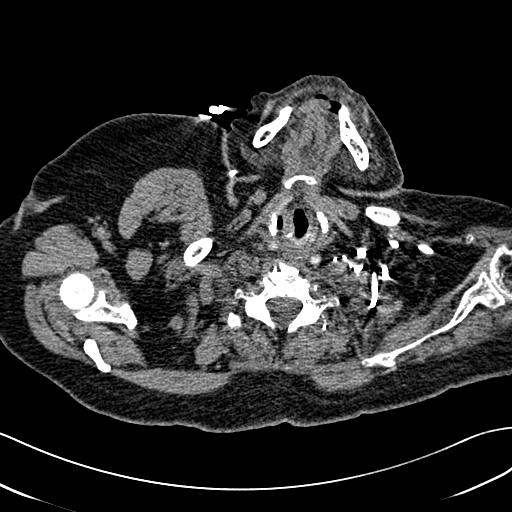

[18 of 30 positions shown; findings below may reference images not displayed]

FINDINGS: Mediastinum/Nodes: No breast masses, supraclavicular or axillary
lymphadenopathy. Mild thyromegaly.

The heart is normal in size and stable. Small amount of pericardial
fluid but no overt effusion. The aorta demonstrates stable advanced
atherosclerotic calcifications along with dense calcifications at
the major branch vessel ostia. Coronary artery calcifications are
stable.

The esophagus is grossly normal.

Stable mediastinal and hilar lymphadenopathy.

The pulmonary arterial tree is well opacified. No filling defects to
suggest pulmonary embolism.

Lungs/Pleura: Persistent dense left upper lobe airspace
consolidation most consistent with pneumonia. There are vessels
running through this area along with air bronchograms. There is also
severe surrounding inflammation/pneumonia involving the entire left
upper lobe and also the right lower lobe. Moderate to marked
peribronchial thickening.

Persistent/stable small cavitary lesions in the superior segment of
the right lower lobe the lesion on image 61 measures 12.5 mm and the
lesion on image 59 measures 9 mm.

Underlying emphysema.  No pleural effusion.

Upper abdomen: No significant findings. Advanced atherosclerotic
calcifications involving the aorta and branch vessels.

Musculoskeletal: Scoliosis and advanced degenerative changes
involving the thoracic spine. No acute bony abnormality.

Review of the MIP images confirms the above findings.
IMPRESSION: 1. Persistent and slight progression of left upper lobe and left
lower lobe pneumonia.
2. Persistent small cavitary lesions in the right lower lobe.
3. Persistent mediastinal and hilar lymphadenopathy.
4. No CT findings for pulmonary embolism.
5. Stable advanced atherosclerotic calcifications involving the
thoracic and abdominal aorta and branch vessels.
6. Patient is apparently already scheduled for a bronchoscopy.
PET-CT may also be helpful for further evaluation.

## 2016-12-15 IMAGING — CR DG CHEST 1V PORT
1 series · 1 of 1 positions shown · non-contrast
Comparison: 12/15/2014

CLINICAL DATA: Dyspnea and cough for a month.

EXAM:
PORTABLE CHEST 1 VIEW

[portable]
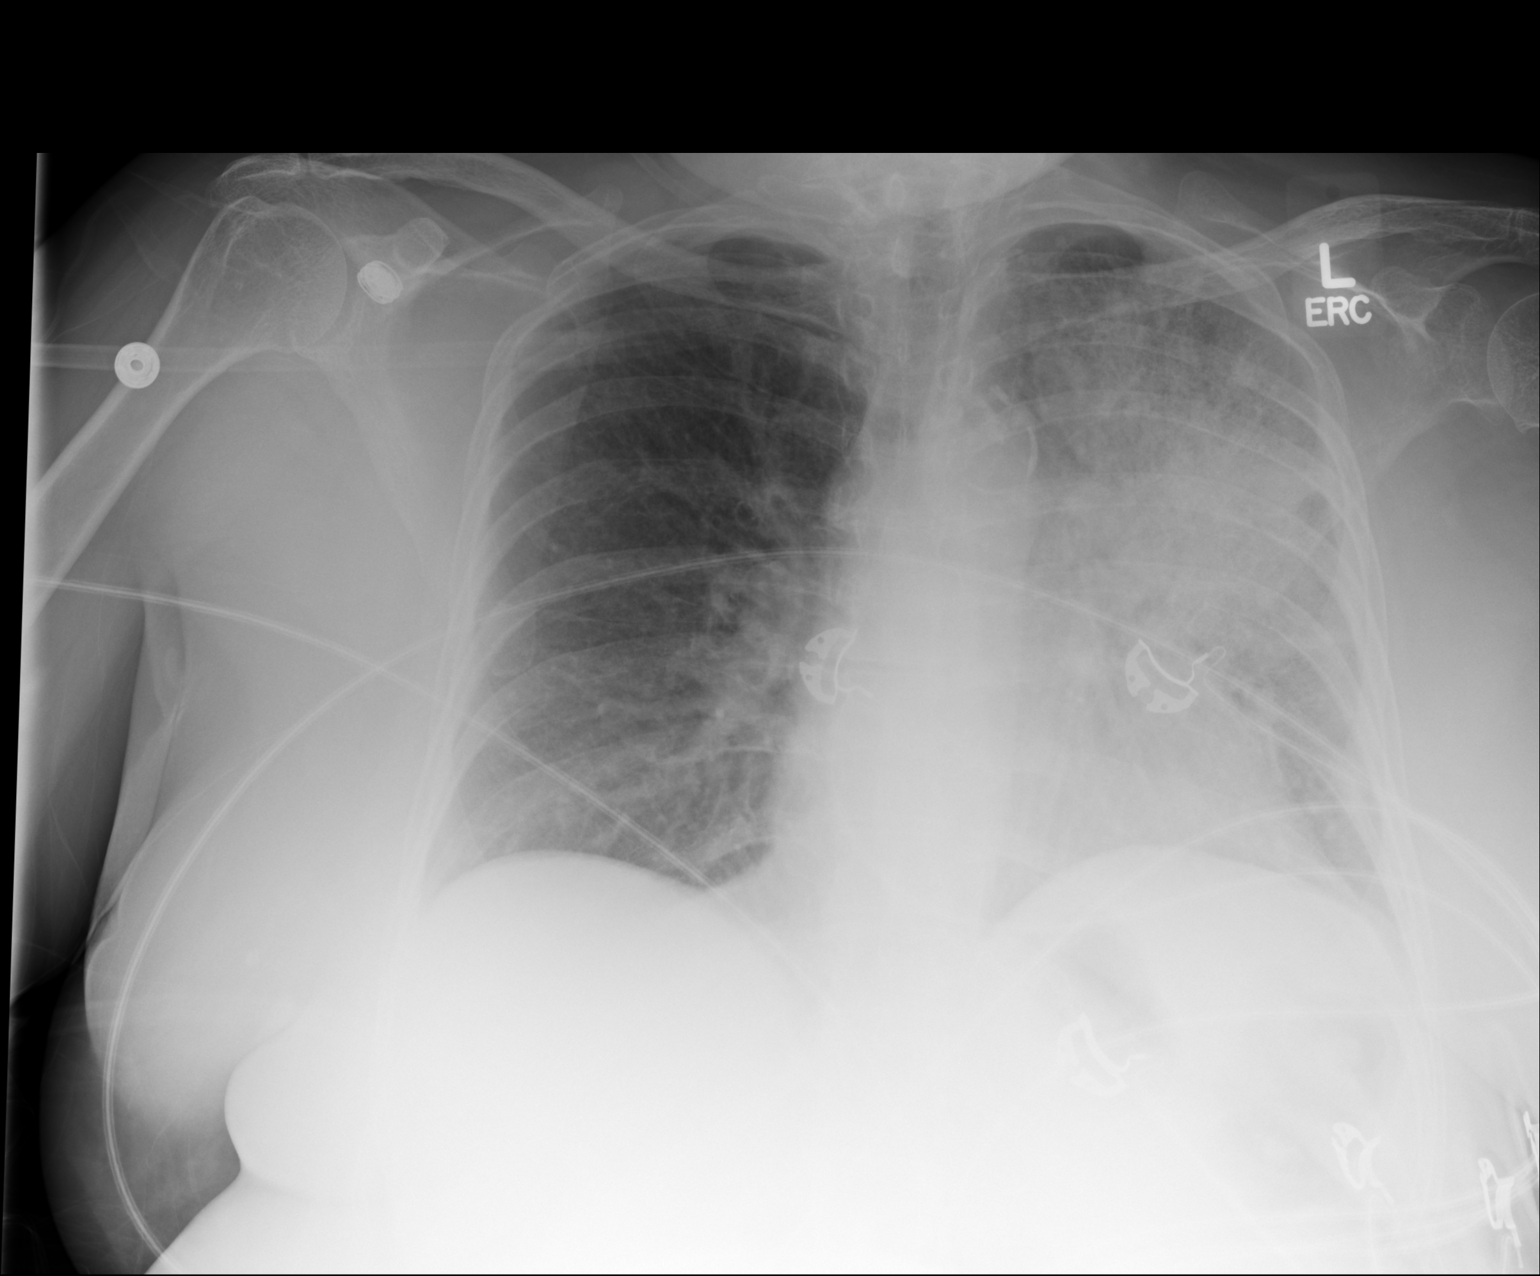

[1 of 1 positions shown; findings below may reference images not displayed]

FINDINGS: There is dense consolidation in the left upper lobe. There is
partial clearance of left base opacity since the prior study. The
right lung remains clear. No large effusion.
IMPRESSION: Persistent dense left upper lobe consolidation.
